# Patient Record
Sex: Male | Born: 1945 | Race: White | Hispanic: No | Smoking: Former smoker
Health system: Southern US, Community
[De-identification: ages and names within clinical notes are randomized; demographics above are authoritative.]

## PROBLEM LIST (undated history)

## (undated) DIAGNOSIS — J302 Other seasonal allergic rhinitis: Secondary | ICD-10-CM

## (undated) DIAGNOSIS — I10 Essential (primary) hypertension: Secondary | ICD-10-CM

## (undated) DIAGNOSIS — K635 Polyp of colon: Secondary | ICD-10-CM

## (undated) DIAGNOSIS — M069 Rheumatoid arthritis, unspecified: Secondary | ICD-10-CM

## (undated) DIAGNOSIS — K219 Gastro-esophageal reflux disease without esophagitis: Secondary | ICD-10-CM

## (undated) DIAGNOSIS — N3281 Overactive bladder: Secondary | ICD-10-CM

## (undated) DIAGNOSIS — K648 Other hemorrhoids: Secondary | ICD-10-CM

## (undated) DIAGNOSIS — C61 Malignant neoplasm of prostate: Secondary | ICD-10-CM

## (undated) DIAGNOSIS — G5603 Carpal tunnel syndrome, bilateral upper limbs: Secondary | ICD-10-CM

## (undated) DIAGNOSIS — G4733 Obstructive sleep apnea (adult) (pediatric): Secondary | ICD-10-CM

## (undated) DIAGNOSIS — H269 Unspecified cataract: Secondary | ICD-10-CM

## (undated) DIAGNOSIS — R351 Nocturia: Secondary | ICD-10-CM

## (undated) DIAGNOSIS — K449 Diaphragmatic hernia without obstruction or gangrene: Secondary | ICD-10-CM

## (undated) DIAGNOSIS — T7840XA Allergy, unspecified, initial encounter: Secondary | ICD-10-CM

## (undated) DIAGNOSIS — K2289 Other specified disease of esophagus: Secondary | ICD-10-CM

## (undated) DIAGNOSIS — G4730 Sleep apnea, unspecified: Secondary | ICD-10-CM

## (undated) DIAGNOSIS — N400 Enlarged prostate without lower urinary tract symptoms: Secondary | ICD-10-CM

## (undated) HISTORY — DX: Gastro-esophageal reflux disease without esophagitis: K21.9

## (undated) HISTORY — DX: Unspecified cataract: H26.9

## (undated) HISTORY — DX: Other specified disease of esophagus: K22.89

## (undated) HISTORY — DX: Overactive bladder: N32.81

## (undated) HISTORY — DX: Malignant neoplasm of prostate: C61

## (undated) HISTORY — DX: Other seasonal allergic rhinitis: J30.2

## (undated) HISTORY — DX: Rheumatoid arthritis, unspecified: M06.9

## (undated) HISTORY — DX: Other hemorrhoids: K64.8

## (undated) HISTORY — DX: Sleep apnea, unspecified: G47.30

## (undated) HISTORY — DX: Benign prostatic hyperplasia without lower urinary tract symptoms: N40.0

## (undated) HISTORY — DX: Polyp of colon: K63.5

## (undated) HISTORY — DX: Obstructive sleep apnea (adult) (pediatric): G47.33

## (undated) HISTORY — DX: Essential (primary) hypertension: I10

## (undated) HISTORY — DX: Diaphragmatic hernia without obstruction or gangrene: K44.9

## (undated) HISTORY — DX: Allergy, unspecified, initial encounter: T78.40XA

---
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
## 2005-12-22 ENCOUNTER — Inpatient Hospital Stay (HOSPITAL_COMMUNITY): Admission: EM | Admit: 2005-12-22 | Discharge: 2005-12-23 | Payer: Self-pay | Admitting: Emergency Medicine

## 2005-12-22 ENCOUNTER — Ambulatory Visit: Payer: Self-pay | Admitting: Cardiology

## 2006-01-28 ENCOUNTER — Ambulatory Visit: Payer: Self-pay | Admitting: Cardiology

## 2008-05-12 ENCOUNTER — Other Ambulatory Visit: Payer: Self-pay

## 2008-05-13 ENCOUNTER — Observation Stay: Payer: Self-pay | Admitting: Internal Medicine

## 2009-10-11 DIAGNOSIS — K635 Polyp of colon: Secondary | ICD-10-CM

## 2009-10-11 HISTORY — DX: Polyp of colon: K63.5

## 2009-10-11 HISTORY — PX: COLONOSCOPY: SHX174

## 2010-03-20 LAB — VITAMIN B12: Vitamin B12 Deficiency Panel: 404

## 2010-03-20 LAB — FOLATE: Folate: 9.4

## 2010-04-20 ENCOUNTER — Ambulatory Visit: Payer: Self-pay | Admitting: Unknown Physician Specialty

## 2010-11-15 ENCOUNTER — Observation Stay (HOSPITAL_COMMUNITY)
Admission: EM | Admit: 2010-11-15 | Discharge: 2010-11-16 | Disposition: A | Discharge status: Home / Self Care | Payer: 59 | Attending: Infectious Diseases | Admitting: Infectious Diseases

## 2010-11-15 ENCOUNTER — Emergency Department (HOSPITAL_COMMUNITY): Payer: 59

## 2010-11-15 DIAGNOSIS — R0609 Other forms of dyspnea: Secondary | ICD-10-CM | POA: Insufficient documentation

## 2010-11-15 DIAGNOSIS — I129 Hypertensive chronic kidney disease with stage 1 through stage 4 chronic kidney disease, or unspecified chronic kidney disease: Secondary | ICD-10-CM | POA: Insufficient documentation

## 2010-11-15 DIAGNOSIS — K219 Gastro-esophageal reflux disease without esophagitis: Secondary | ICD-10-CM | POA: Insufficient documentation

## 2010-11-15 DIAGNOSIS — R0989 Other specified symptoms and signs involving the circulatory and respiratory systems: Secondary | ICD-10-CM | POA: Insufficient documentation

## 2010-11-15 DIAGNOSIS — R079 Chest pain, unspecified: Principal | ICD-10-CM | POA: Insufficient documentation

## 2010-11-15 DIAGNOSIS — I2510 Atherosclerotic heart disease of native coronary artery without angina pectoris: Secondary | ICD-10-CM | POA: Insufficient documentation

## 2010-11-15 DIAGNOSIS — R030 Elevated blood-pressure reading, without diagnosis of hypertension: Secondary | ICD-10-CM

## 2010-11-15 DIAGNOSIS — N189 Chronic kidney disease, unspecified: Secondary | ICD-10-CM | POA: Insufficient documentation

## 2010-11-15 DIAGNOSIS — E785 Hyperlipidemia, unspecified: Secondary | ICD-10-CM | POA: Insufficient documentation

## 2010-11-15 LAB — TROPONIN I: Troponin I: <0.01 ng/mL (ref 0.00–0.06)

## 2010-11-15 LAB — RAPID URINE DRUG SCREEN, HOSP PERFORMED
Amphetamines: NOT DETECTED
Barbiturates: NOT DETECTED
Cocaine: NOT DETECTED
Tetrahydrocannabinol: NOT DETECTED

## 2010-11-15 LAB — CK TOTAL AND CKMB (NOT AT ARMC)
CK, MB: 1.9 ng/mL (ref 0.3–4.0)
Relative Index: INVALID (ref 0.0–2.5)
Total CK: 82 U/L (ref 7–232)

## 2010-11-15 LAB — POCT CARDIAC MARKERS
CKMB, poc: <1 ng/mL — ABNORMAL LOW (ref 1.0–8.0)
Myoglobin, poc: 74.9 ng/mL (ref 12–200)
Troponin i, poc: <0.05 ng/mL (ref 0.00–0.09)

## 2010-11-15 LAB — CBC
HCT: 47.1 % (ref 39.0–52.0)
MCV: 88.5 fL (ref 78.0–100.0)
Platelets: 245 10*3/uL (ref 150–400)
RBC: 5.32 MIL/uL (ref 4.22–5.81)
RDW: 13.2 % (ref 11.5–15.5)
WBC: 11 10*3/uL — ABNORMAL HIGH (ref 4.0–10.5)

## 2010-11-15 LAB — POCT I-STAT, CHEM 8
BUN: 19 mg/dL (ref 6–23)
Creatinine, Ser: 1.6 mg/dL — ABNORMAL HIGH (ref 0.4–1.5)
HCT: 48 % (ref 39.0–52.0)
Hemoglobin: 16.3 g/dL (ref 13.0–17.0)
Potassium: 4.5 mEq/L (ref 3.5–5.1)
TCO2: 26 mmol/L (ref 0–100)

## 2010-11-15 LAB — DIFFERENTIAL: Basophils Relative: 0 % (ref 0–1)

## 2010-11-15 LAB — URINALYSIS, ROUTINE W REFLEX MICROSCOPIC
Ketones, ur: NEGATIVE mg/dL
Specific Gravity, Urine: 1.013 (ref 1.005–1.030)
pH: 5.5 (ref 5.0–8.0)

## 2010-11-16 ENCOUNTER — Telehealth (INDEPENDENT_AMBULATORY_CARE_PROVIDER_SITE_OTHER): Payer: Self-pay | Admitting: *Deleted

## 2010-11-16 DIAGNOSIS — R0789 Other chest pain: Secondary | ICD-10-CM

## 2010-11-16 LAB — BASIC METABOLIC PANEL
CO2: 25 mEq/L (ref 19–32)
Calcium: 8.9 mg/dL (ref 8.4–10.5)
Creatinine, Ser: 1.43 mg/dL (ref 0.4–1.5)
GFR calc Af Amer: >60 mL/min (ref >60)
GFR calc non Af Amer: 50 mL/min — ABNORMAL LOW (ref >60)
Glucose, Bld: 111 mg/dL — ABNORMAL HIGH (ref 70–99)

## 2010-11-16 LAB — LIPID PANEL: LDL Cholesterol: 130 mg/dL — ABNORMAL HIGH (ref 0–99)

## 2010-11-16 LAB — HEMOGLOBIN A1C: Hgb A1c MFr Bld: 5.7 % — ABNORMAL HIGH (ref <5.7)

## 2010-11-16 LAB — CARDIAC PANEL(CRET KIN+CKTOT+MB+TROPI)
CK, MB: 1.3 ng/mL (ref 0.3–4.0)
Relative Index: INVALID (ref 0.0–2.5)
Relative Index: INVALID (ref 0.0–2.5)
Troponin I: <0.01 ng/mL (ref 0.00–0.06)
Troponin I: 0.01 ng/mL (ref 0.00–0.06)

## 2010-11-16 LAB — SODIUM, URINE, RANDOM: Sodium, Ur: 83 mEq/L

## 2010-11-16 LAB — D-DIMER, QUANTITATIVE: D-Dimer, Quant: 0.44 ug/mL-FEU (ref 0.00–0.48)

## 2010-11-17 ENCOUNTER — Ambulatory Visit (HOSPITAL_COMMUNITY): Payer: 59 | Attending: Cardiology

## 2010-11-17 ENCOUNTER — Encounter: Payer: Self-pay | Admitting: Cardiology

## 2010-11-17 ENCOUNTER — Encounter: Payer: 59 | Admitting: Physician Assistant

## 2010-11-17 DIAGNOSIS — R0789 Other chest pain: Secondary | ICD-10-CM

## 2010-11-17 DIAGNOSIS — R0602 Shortness of breath: Secondary | ICD-10-CM

## 2010-11-17 DIAGNOSIS — R079 Chest pain, unspecified: Secondary | ICD-10-CM

## 2010-11-19 ENCOUNTER — Ambulatory Visit (HOSPITAL_COMMUNITY): Payer: 59 | Attending: Cardiology

## 2010-11-19 DIAGNOSIS — I2510 Atherosclerotic heart disease of native coronary artery without angina pectoris: Secondary | ICD-10-CM | POA: Insufficient documentation

## 2010-11-19 DIAGNOSIS — I379 Nonrheumatic pulmonary valve disorder, unspecified: Secondary | ICD-10-CM | POA: Insufficient documentation

## 2010-11-19 DIAGNOSIS — I079 Rheumatic tricuspid valve disease, unspecified: Secondary | ICD-10-CM | POA: Insufficient documentation

## 2010-11-19 DIAGNOSIS — E785 Hyperlipidemia, unspecified: Secondary | ICD-10-CM | POA: Insufficient documentation

## 2010-11-19 DIAGNOSIS — R072 Precordial pain: Secondary | ICD-10-CM | POA: Insufficient documentation

## 2010-11-19 DIAGNOSIS — I10 Essential (primary) hypertension: Secondary | ICD-10-CM | POA: Insufficient documentation

## 2010-11-20 ENCOUNTER — Encounter (INDEPENDENT_AMBULATORY_CARE_PROVIDER_SITE_OTHER): Payer: Self-pay | Admitting: *Deleted

## 2010-11-20 ENCOUNTER — Other Ambulatory Visit (HOSPITAL_COMMUNITY): Payer: 59

## 2010-11-26 HISTORY — PX: CARDIOVASCULAR STRESS TEST: SHX262

## 2010-11-26 NOTE — Progress Notes (Signed)
Summary: Nuclear pre procedure  Phone Note Outgoing Call Call back at [facility] Phone [phone]   Call placed by: Rea [employer], CMA,  November 16, 2010 4:20 PM Call placed to: Patient Summary of Call: Reviewed information on Myoview Information Sheet (see scanned document for further details).  Patient still in hospital at [facility]. Spoke with patient's nurse regarding instructions for stress test.      Nuclear Med Background Indications for Stress Test: Evaluation for Ischemia, Post Hospital  Indications Comments: 11/15/10 Chest pain  History: Heart Catheterization  History Comments: '07 Cath:minimal n/o CAD, EF=60%  Symptoms: Chest Pain, Chest Tightness    Nuclear Pre-Procedure Cardiac Risk Factors: Hypertension

## 2010-11-26 NOTE — Letter (Signed)
Summary: Return To Work  [employer], Main Office  [address]   [place], [place] [address]   Phone: [phone]  Fax: [phone]    11/20/2010  TO: Leodis Sias IT MAY CONCERN   RE: Kevin Campbell [address] [id number]   The above named individual is under my medical care and may return to work [employer] 11-23-10 WITH NO RESTRICTIONS  If you have any further questions or need additional information, please call.     Sincerely, Deliah Goody, RN/Dr Olga Millers  Appended Document: Return To Work faxed to [phone] attn chris at pts request

## 2010-11-26 NOTE — Assessment & Plan Note (Signed)
Summary: Cardiology Nuclear Testing  Nuclear Med Background Indications for Stress Test: Evaluation for Ischemia  Indications Comments: 11/15/10 Chest pain  History: Heart Catheterization  History Comments: '07 Cath:minimal n/o CAD, EF=60%  Symptoms: Chest Pain, Chest Tightness, DOE, SOB    Nuclear Pre-Procedure Cardiac Risk Factors: Hypertension Caffeine/Decaff Intake: none NPO After: 9:00 PM Lungs: clear IV 0.9% NS with Angio Cath: 22g     IV Site: R Hand IV Started by: Cathlyn Parsons, RN Chest Size (in) 44     Height (in): 67 Weight (lb): 242 BMI: 38.04 Tech Comments: The patient was unable to keep up with the treadmill, so he was turned to a walking Lexiscan.  Nuclear Med Study 1 or 2 day study:  1 day     Stress Test Type:  Treadmill/Lexiscan Reading MD:  Olga Millers, MD     Referring MD:  B.Len Kluver Resting Radionuclide:  Technetium 45m Tetrofosmin     Resting Radionuclide Dose:  11 mCi  Stress Radionuclide:  Technetium 16m Tetrofosmin     Stress Radionuclide Dose:  33 mCi   Stress Protocol  Max Systolic BP: 127 mm Hg Lexiscan: 0.4 mg   Stress Test Technologist:  Milana Na, EMT-P     Nuclear Technologist:  Domenic Polite, CNMT  Rest Procedure  Myocardial perfusion imaging was performed at rest 45 minutes following the intravenous administration of Technetium 17m Tetrofosmin.  Stress Procedure  The patient received IV Lexiscan 0.4 mg over 15-seconds with concurrent low level exercise and then Technetium 67m Tetrofosmin was injected at 30-seconds while the patient continued walking one more minute.  There were no significant changes with Lexiscan.  Quantitative spect images were obtained after a 45 minute delay.  QPS Raw Data Images:  There is no interference from other nuclear activity. Stress Images:  There is decreased uptake in the inferior wall Rest Images:  There is decreased uptake in the inferior wall. Subtraction (SDS):  No evidence of  ischemia. Transient Ischemic Dilatation:  .98  (Normal <1.22)  Lung/Heart Ratio:  .37  (Normal <0.45)  Quantitative Gated Spect Images QGS EDV:  103 ml QGS ESV:  52 ml QGS EF:  49 % QGS cine images:  Normal wall motion; visually, LV function appears better than calculated EF; suggest echo to better evaluate.   Overall Impression  Exercise Capacity: Lexiscan with no exercise. BP Response: Normal blood pressure response. Clinical Symptoms: No chest pain ECG Impression: No significant ST segment change suggestive of ischemia. Overall Impression: Normal lexiscan nuclear study with inferior thinning but no ischemia.  Appended Document: Cardiology Nuclear Testing schedule echo to better assess EF; otherwise normal study  Appended Document: Cardiology Nuclear Testing pt walked into the office today to get nuc results. he is very anxious about returning to work. echo will be done today.

## 2010-12-07 ENCOUNTER — Encounter: Payer: 59 | Admitting: Cardiology

## 2010-12-09 DIAGNOSIS — N401 Enlarged prostate with lower urinary tract symptoms: Secondary | ICD-10-CM | POA: Insufficient documentation

## 2010-12-09 DIAGNOSIS — E1169 Type 2 diabetes mellitus with other specified complication: Secondary | ICD-10-CM | POA: Insufficient documentation

## 2010-12-09 DIAGNOSIS — K219 Gastro-esophageal reflux disease without esophagitis: Secondary | ICD-10-CM | POA: Insufficient documentation

## 2010-12-09 DIAGNOSIS — R351 Nocturia: Secondary | ICD-10-CM

## 2010-12-09 DIAGNOSIS — N183 Chronic kidney disease, stage 3 (moderate): Secondary | ICD-10-CM

## 2010-12-09 DIAGNOSIS — I10 Essential (primary) hypertension: Secondary | ICD-10-CM | POA: Insufficient documentation

## 2010-12-09 DIAGNOSIS — R079 Chest pain, unspecified: Secondary | ICD-10-CM | POA: Insufficient documentation

## 2010-12-09 DIAGNOSIS — E785 Hyperlipidemia, unspecified: Secondary | ICD-10-CM | POA: Insufficient documentation

## 2010-12-10 ENCOUNTER — Other Ambulatory Visit: Payer: Self-pay | Admitting: Cardiology

## 2010-12-10 ENCOUNTER — Encounter (INDEPENDENT_AMBULATORY_CARE_PROVIDER_SITE_OTHER): Payer: 59 | Admitting: Cardiology

## 2010-12-10 ENCOUNTER — Encounter: Payer: Self-pay | Admitting: Cardiology

## 2010-12-10 DIAGNOSIS — E7800 Pure hypercholesterolemia, unspecified: Secondary | ICD-10-CM

## 2010-12-10 DIAGNOSIS — R072 Precordial pain: Secondary | ICD-10-CM

## 2010-12-11 LAB — HEPATIC FUNCTION PANEL
ALT: 22 U/L (ref 0–53)
Bilirubin, Direct: 0.1 mg/dL (ref 0.0–0.3)
Total Protein: 7 g/dL (ref 6.0–8.3)

## 2010-12-11 LAB — LIPID PANEL
Cholesterol: 123 mg/dL (ref 0–200)
HDL: 34 mg/dL — ABNORMAL LOW (ref >39.00)
LDL Cholesterol: 73 mg/dL (ref 0–99)
Total CHOL/HDL Ratio: 4
Triglycerides: 80 mg/dL (ref 0.0–149.0)
VLDL: 16 mg/dL (ref 0.0–40.0)

## 2010-12-16 ENCOUNTER — Encounter (INDEPENDENT_AMBULATORY_CARE_PROVIDER_SITE_OTHER): Payer: Self-pay | Admitting: *Deleted

## 2010-12-17 NOTE — Assessment & Plan Note (Signed)
Summary: eph. f/u on stress test per mat pagd   History of Present Illness: 65 year old male recently admitted with chest pain. Previous catheterization in 2007 revealed no obstructive coronary disease and normal LV function. Admitted to [facility] in February of 2012 with chest pain. Enzymes negative. D-dimer negative. Outpatient nuclear study showed inferior thinning but no ischemia. Ejection fraction 49%. Followup Echo showed an ejection fraction of 50-55% and mild left atrial enlargement. Since discharge, he has occasional chest pain. It  can occasionally be under his left breast or right breast. It is described as a sharp pain lasting seconds. He can also have a tightness in the substernal area that occurs with stress that lasts seconds. These pains do not radiate. They are not pleuritic, positional, related to food or exertional. There are no associated symptoms. He does not have dyspnea on exertion, orthopnea, PND, syncope or exertional chest pain.  Current Medications (verified): 1)  Simvastatin 20 Mg Tabs (Simvastatin) .[name].. 1 By Mouth Daily 2)  Vitamin D3 2000 Unit Caps (Cholecalciferol) .[name].. 1 By Mouth Daily 3)  Aspirin 81 Mg  Tabs (Aspirin) .[name].. 1 By Mouth Daily 4)  Protonix 40 Mg Tbec (Pantoprazole Sodium) .[name].. 1 By Mouth Daily  Allergies (verified): No Known Drug Allergies  Past History:  Past Medical History: DYSLIPIDEMIA  HYPERTENSION GERD OBESITY BENIGN PROSTATIC HYPERTROPHY, WITH OBSTRUCTION  KIDNEY DISEASE  Past Surgical History: Reviewed history from 12/09/2010 and no changes required.  Left knee arthroscopy in 1994.  Social History: Reviewed history from 12/09/2010 and no changes required.  He lives in [place] with his wife.  He is a Ecologist.  He does have daughters.  He previously smoked 2-3 cigars a day for  20 years, quitting 15 years ago.  He denies any alcohol or drug use.  He  does not routinely exercise and does not follow a strict diet  and in fact  says he has a truckers' diet.  Review of Systems       no fevers or chills, productive cough, hemoptysis, dysphasia, odynophagia, melena, hematochezia, dysuria, hematuria, rash, seizure activity, orthopnea, PND, pedal edema, claudication. Remaining systems are negative.   Vital Signs:  Patient profile:   65 year old male Height:      67 inches Weight:      251 pounds BMI:     39.45 Pulse rate:   84 / minute Resp:     16 per minute BP sitting:   156 / 96  (left arm)  Vitals Entered By: Marrion Coy, CNA (December 10, 2010 4:21 PM)  Physical Exam  General:  Well-developed well-nourished in no acute distress.  Skin is warm and dry.  HEENT is normal.  Neck is supple. No thyromegaly.  Chest is clear to auscultation with normal expansion.  Cardiovascular exam is regular rate and rhythm.  Abdominal exam nontender or distended. No masses palpated. Extremities show no edema. neuro grossly intact    Impression & Recommendations:  Problem # 1:  CHEST PAIN (ICD-786.50) Symptoms are extremely atypical and chronic. Previous catheterization revealed nonobstructive coronary disease. Recent Myoview negative. No further ischemia evaluation. His updated medication list for this problem includes:    Aspirin 81 Mg Tabs (Aspirin) .[name]... 1 by mouth daily  Problem # 2:  DYSLIPIDEMIA (ICD-272.4) Continue statin. Check lipids and liver. He will then followup with his primary care for further management of this issue. His updated medication list for this problem includes:    Simvastatin 20 Mg Tabs (  Simvastatin) .[name]... 1 by mouth daily  Problem # 3:  HYPERTENSION (ICD-401.9) Patient's blood pressure is elevated today. However he states typically normal. He will follow this and then see his primary care physician. Antihypertensive can be added as needed. His updated medication list for this problem includes:    Aspirin 81 Mg Tabs (Aspirin) .[name]... 1 by mouth daily  Problem # 4:  GERD  (ICD-530.81)  His updated medication list for this problem includes:    Protonix 40 Mg Tbec (Pantoprazole sodium) .[name]... 1 by mouth daily  Problem # 5:  KIDNEY DISEASE (ICD-593.9)  Other Orders: TLB-Lipid Panel (80061-LIPID) TLB-Hepatic/Liver Function Pnl (80076-HEPATIC)  Patient Instructions: 1)  FOLLOW UP WILL BE WITH DR Lacie Scotts

## 2010-12-18 NOTE — Consult Note (Signed)
Kevin Campbell, Kevin Campbell              ACCOUNT NO.:  [id number]  MEDICAL RECORD NO.:  [id number]           PATIENT TYPE:  I  LOCATION:  2022                         FACILITY:  [facility]  PHYSICIAN:  Pricilla Riffle, MD, FACCDATE OF BIRTH:  1946/04/15  DATE OF CONSULTATION:  11/16/2010 DATE OF DISCHARGE:  11/16/2010                                CONSULTATION   PRIMARY CARDIOLOGIST:  Madolyn Frieze. Jens Som, MD, [facility]  PRIMARY CARE PHYSICIAN:  Dr. Evelene Croon.  REASON FOR CONSULTATION:  Chest discomfort.  HISTORY OF PRESENT ILLNESS:  [name] is a 65 year old Caucasian gentleman with a known history of minimal nonobstructive coronary artery disease per cardiac catheterization in March 2007, that showed no lesion over 30%, normal left ventricular systolic function with EF of 60%, and normal renal arteries at that time, question of history of hypertension, obesity, and GERD, who now presents for further evaluation of chest discomfort first noted yesterday afternoon at rest with subsequent finding of significantly elevated blood pressure with systolic blood pressure of 198 at that time.  The patient was in his usual state of health until the afternoon of November 15, 2010, when he noticed chest tightness that first began at rest.  He was talking with his family and not in any significant stress when he noticed chest tightness across his chest that was neither pleuritic nor positional nor exertional.  The patient gave it some time but unfortunately symptoms were not resolving, so he decided to present to the fire department near his home for further evaluation.  His wife drove him there and his blood pressure was checked, found to be 198/86. EKG was completed with no concerning findings.  However, due to his elevated blood pressure and ongoing symptoms although much improved by the time of his EKG and blood pressure checked at fire department, he decided to present to [facility] ED for further  evaluation.  The patient notes that his symptoms had resolved by the time he got to [facility] ED, having lasted approximately 45 minutes.  He had no associated symptoms including no diaphoresis, no shortness of breath, no nausea, no vomiting, no palpitations, no pre/syncope, nor any other recent changes like fevers, chills, bleeding, cough, etc.  The patient has had no hypertension here at [facility] ED and has not received any anti-hyperintense therapy.  At home, he took 2 low-dose aspirin and here he continues on low-dose aspirin, Protonix 40 mg p.o. daily, and heparin for DVT prophylaxis as well as p.r.n. medications.  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  He also works in his garden and works on cars without symptoms although he does no regular exercise.  No history of tobacco abuse, quitting over 20 years ago.  No recent complaints other than described above.  Of note, he did go on a long drive a less than week ago for over 5 hours but D-dimer has been checked and is negative.  Careful review of his EKG tracings from yesterday and today revealed no acute changes.  Cardiac enzymes  are negative x1 set of point-of-care markers and 2 full sets of enzymes. BNP is less than 30.  Labs otherwise unremarkable except for LDL 130 and HDL 27.  The patient has had no recurrence of symptoms while here at the hospital.  PAST MEDICAL HISTORY: 1. Minimal coronary artery disease per cardiac cath in March 2007     (nothing over 30% lesion, normal left ventricular systolic     function, normal renal arteries). 2. Question history of hypertension (regular followup with primary     care provider, not on meds, per the patient, no significant     hypertension in the past). 3. Obesity. 4. GERD. 5. Plantar fasciitis  PAST SURGICAL HISTORY:  Left knee arthroscopy, 1994.  SOCIAL HISTORY:  The patient lives in  [place] with his wife.  He is a Naval architect.  He has remote history of tobacco abuse, quitting 20 years ago.  No EtOH.  No illicit drug use.  No herbal meds.  Regular diet.  No regular exercise but is very active in daily life, getting in and out of his truck without difficulty, working in the garden as well as working on cars without any exertional symptoms.  FAMILY HISTORY:  Mother positive for history of CAD but first diagnosed in early 24s, CABG x3.  Father died of lung cancer in 59s.  No other first-degree relatives with coronary artery disease or significant medical history.  REVIEW OF SYSTEMS:  Please see HPI.  All other systems were reviewed and were negative.  CODE STATUS:  Full.  ALLERGIES: 1. BENADRYL. 2. CODEINE.  MEDICATIONS:  Home:  None.  In the hospital: 1. ECASA 81 mg p.o. daily. 2. Protonix 40 mg daily. 3. Heparin 5000 units subcu injection q.8 hours. 4. P.r.n. medications.  PHYSICAL EXAMINATION:  VITAL SIGNS:  Temp is 98.0 degrees Fahrenheit, BP 109-118/79-85, pulse 70s to 80s, respirations 18, O2 saturation 93% on room air. GENERAL:  The patient is alert and oriented x3, in no apparent distress. No respiratory distress with full sentences or minimal movements during exam. HEENT:  Head is normocephalic, atraumatic.  Pupils equal, round, and react to light.  Extraocular muscles are intact.  Nares are patent without discharge.  Dentition is fair.  Oropharynx without erythema or exudates. NECK:  Supple without lymphadenopathy.  No thyromegaly.  No JVD. HEART:  Rate is regular with audible S1 and S2.  No clicks, rubs, murmurs, or gallops.  Pulses are 2+ and equal in both upper and lower extremities bilaterally. LUNGS:  Clear to auscultation bilaterally. SKIN:  No rashes, lesions, or petechiae. ABDOMEN:  Soft, nontender, nondistended.  Normal abdominal bowel sounds. No rebound or guarding.  No hepatosplenomegaly.  The patient is obese. EXTREMITIES:   Without clubbing, cyanosis, or edema. MUSCULOSKELETAL:  Without joint deformity or effusions.  No spinal or CVA tenderness. Chest, nontender. NEUROLOGIC:  Cranial nerves II-XII grossly intact.  Strength 5/5 in all extremities and axial groups.  Normal sensation throughout.  Normal cerebellar function.  RADIOLOGY:  Chest x-ray showed no significant findings.  EKG, rhythm NSR, rate 75 bpm, no significant ST-T wave changes but delayed R-wave progression.  No significant Q-waves.  Normal axis.  No evidence of hypertrophy.  PR 118, QRS 274, and QTc 424.  No prior tracing for comparison.  LABS:  WBC is 11.0 with a normal differential, HGB 16.3, HCT 48.0, PLT count 245.  Sodium 140, potassium 4.2, chloride 106, bicarb 25, BUN 16, creatinine 1.43, glucose 111.  D-dimer 0.44.  BNP  less than 30.  Point- of-care markers negative x1, 2 full sets of cardiac enzymes within normal limits.  ProTime 12.9, INR 0.95.  Total cholesterol 173, triglycerides 82, HDL 27, LDL 130.  ASSESSMENT AND PLAN:  The patient was initially seen by Lenard Galloway, PAC, and then seen and thoroughly assessed by attending cardiologist, Dr. Dietrich Pates.  [name] is a 65 year old Caucasian gentleman with a known history of minimal coronary artery disease with no lesions over 30%, normal left ventricular systolic function, normal renal arteries per cath in March 2007, as well as question of history of hypertension, obesity, as well as history significant for all GERD, who now presents with chest discomfort with both typical and atypical features.  1. Chest pain - mostly atypical and clearly no exertional symptoms     recently as well as no objective evidence of a cardiac etiology.     Of note, his symptoms in 2007 prior to that catheterization were     very similar.  It is concerning that his blood pressure was     significantly elevated at a fire department, but the picture looks     overall to the point to alternative  etiology.  We would schedule     treadmill stress Myoview at our office as soon as possible but does     not need further ischemic evaluation in the hospital.  Would     ambulate to ensure no recurrent symptoms prior to discharge. 2. Dyslipidemia - the patient has known minimal coronary artery     disease with LDL above goal and HDL low, would start low-dose     generic statin therapy as well as continue emphasized the     importance of heart healthy diet/lifestyle. 3. Hypertension.  It is curious that the patient is on no     antihypertensives here in the hospital, his blood pressure is     within normal limits.  Would get a blood pressure cuff for home     checks and followup with primary care provider. 4. Obesity - I emphasized the importance of heart healthy diet and     lifestyle.  The patient is motivated to increase activity and     increased fruits and vegetables as well as decreasing simple     carbohydrates and saturated fat intake.  ADDENDUM:  Note:  Dr. Tenny Craw spoke to the patient and he is very anxious to get back to work.  Unfortunately, the patient will need some ischemic evaluation prior to returning to work and therefore if we cannot get an outpatient Myoview tomorrow in our office, we will arrange for inpatient treadmill Myoview.  Thanks for the consult.     Jarrett Ables, PAC   ______________________________ Pricilla Riffle, MD, [facility]    MS/MEDQ  D:  11/16/2010  T:  11/17/2010  Job:  [id number]  Electronically Signed by Jarrett Ables PAC on 11/18/2010 03:26:06 PM Electronically Signed by Dietrich Pates MD FACC on 12/17/2010 02:12:46 PM

## 2010-12-22 NOTE — Letter (Signed)
Summary: Custom - Lipid  [facility], Main Office  [address]   [place], [place] [address]   Phone: [phone]  Fax: [phone]     December 16, 2010 MRN: [id number]   Kevin Campbell [address] [place], [place]  [address]   Dear Kevin Campbell,  We have reviewed your cholesterol results.  They are as follows:     Total Cholesterol:    123 (Desirable: less than 200)       HDL  Cholesterol:     34.00  (Desirable: greater than 40 for men and 50 for women)       LDL Cholesterol:       73  (Desirable: less than 100 for low risk and less than 70 for moderate to high risk)       Triglycerides:       80.0  (Desirable: less than 150)  Our recommendations include:These numbers look good. Continue on the same medicine. Liver function is normal. Follow up with your primary care physican for future lab work.Take care, Dr. Darel Hong.    Call our office at the number listed above if you have any questions.  Lowering your LDL cholesterol is important, but it is only one of a large number of "risk factors" that may indicate that you are at risk for heart disease, stroke or other complications of hardening of the arteries.  Other risk factors include:   A.  Cigarette Smoking* B.  High Blood Pressure* C.  Obesity* D.   Low HDL Cholesterol (see yours above)* E.   Diabetes Mellitus (higher risk if your is uncontrolled) F.  Family history of premature heart disease G.  Previous history of stroke or cardiovascular disease    *These are risk factors YOU HAVE CONTROL OVER.  For more information, visit .  There is now evidence that lowering the TOTAL CHOLESTEROL AND LDL CHOLESTEROL can reduce the risk of heart disease.  The American Heart Association recommends the following guidelines for the treatment of elevated cholesterol:  1.  If there is now current heart disease and less than two risk factors, TOTAL CHOLESTEROL should be less than 200 and LDL CHOLESTEROL should be  less than 100. 2.  If there is current heart disease or two or more risk factors, TOTAL CHOLESTEROL should be less than 200 and LDL CHOLESTEROL should be less than 70.  A diet low in cholesterol, saturated fat, and calories is the cornerstone of treatment for elevated cholesterol.  Cessation of smoking and exercise are also important in the management of elevated cholesterol and preventing vascular disease.  Studies have shown that 30 to 60 minutes of physical activity most days can help lower blood pressure, lower cholesterol, and keep your weight at a healthy level.  Drug therapy is used when cholesterol levels do not respond to therapeutic lifestyle changes (smoking cessation, diet, and exercise) and remains unacceptably high.  If medication is started, it is important to have you levels checked periodically to evaluate the need for further treatment options.  Thank you,    [employer] Team

## 2011-01-12 NOTE — Discharge Summary (Signed)
NAME:  Kevin Campbell, Kevin Campbell              ACCOUNT NO.:  [id number]  MEDICAL RECORD NO.:  [id number]           PATIENT TYPE:  I  LOCATION:  2022                         FACILITY:  [facility]  PHYSICIAN:  Lacretia Leigh. Hatcher, M.D.DATE OF BIRTH:  1946-02-02  DATE OF ADMISSION:  11/15/2010 DATE OF DISCHARGE:  11/16/2010                              DISCHARGE SUMMARY   DISCHARGE DIAGNOSES: 1. Chest pain, most likely atypical. 2. Hypertension. 3. Dyslipidemia. 4. Chronic kidney disease.  DISCHARGE MEDICATIONS: 1. Aspirin 81 mg p.o. daily. 2. Protonix 40 mg p.o. daily. 3. Zocor 20 mg p.o. daily. 4. Vitamin D3 2000 units 1 tablet p.o. daily.  DISPOSITION AND FOLLOWUP:  Kevin Campbell was discharged from [facility] on November 16, 2010 in stable and improved condition.  He has no other episode of chest pain, nausea, vomiting or diaphoresis or shortness of breath.  He needs to continue to take his aspirin 81 mg p.o. daily.  If he later develops chest pain, nausea and vomiting, diaphoresis or short of breath, he needs to make an appointment with his PCP or go to the nearest ED for further evaluation and treatment.  He has an appointment with Cardiology for a stress test on November 17, 2010.  He also needs to follow up with Dr. Lacie Scotts, his PCP in 3-7 days for a followup.  At that time, he will need to: 1. Check on his chest pain. 2. Follow up on his hypertension because prior to admission, the     patient's blood pressure was elevated to systolics greater than     190s. 3. Follow up on his creatinine level.  On admission, it was 1.6,     however, it has trended down to his baseline which was 1.3-1.4.  CONSULTATIONS:  Cardiology with Dr. Dietrich Pates, please see transcription for full consult note.  PROCEDURE PERFORMED:  Chest x-ray on November 15, 2010 shows no significant abnormality.  No pleural effusion identified.  EKG on November 15, 2010 shows normal sinus rhythm with no T-wave or ST  changes or Q-waves.  EKG on November 16, 2010 shows again normal sinus rhythm.  ADMISSION HISTORY:  Kevin Campbell is a 65 year old man with past medical history of CAD who presented to the [facility] ED with complaints of chest pain that started in the afternoon on the day of admission.  The pain started while he was sitting in his recliner and there was a tightness across both sides of his chest with no radiation.  It was associated with some wheeziness but no short of breath, diaphoresis, or palpitation.  The pain did not resolve with 2 aspirins, so his wife drove him to the local fire station where his blood pressure was found to be 198/96.  He was advised to come to the ED.  Chest pain has resolved by the time he presented to the ED.  He had a small cold that was producing occasional phlegm, cough, and minor URI congestion.  He also noted while he was having a chest x-ray that he got sharp pain when he took a deep breath.  He denied any fever,  chills, nausea, vomiting, diarrhea, constipation, and urinary complaints.  He takes no medication. He has occasional heartburn when he eats spicy food and belching with some food.  ADMITTING PHYSICAL EXAMINATION:  VITAL SIGNS:  Temperature 98.7, pulse 74, blood pressure 142/89, respiratory rate 18, and saturating 98% on room air. GENERAL:  Pleasant, obese-appearing man lying on stretcher in no acute distress. HEENT:  Pupils equal, round, and reactive to light and accommodation. Extraocular movements intact. CARDIOVASCULAR:  Regular rate and rhythm.  No rubs or gallops appreciated.  S1-S2. LUNGS:  Clear to auscultation.  No wheezes, crackles, or rhonchi. Abdomen:  Soft, nondistended, and nontender.  Positive normoactive bowel sounds. EXTREMITIES:  No cyanosis or clubbing.  Distal pulses +2 and symmetric. SKIN:  Warm and moist. NEURO:  Alert and oriented x3.  No neural focal deficits.  Motor strength 5/5 in all 4 extremities.  Fine touch to  sensation intact. PSYCH:  Mood and affect appropriate.  ADMISSION LABORATORY DATA:  WBC 11.0, hemoglobin 15.7, hematocrit 47.1, platelet 245, neutrophil 53%, ANC 5.8.  Sodium 140, potassium 4.5, chloride 105, bicarb 26, BUN 19, creatinine 1.6, and glucose 106.  PT 12.9 and INR 0.95.  BNP less than 30.  POC CK-MB 1.1, POC troponin less than 0.05, POC myoglobin 108.  CK-MB POC less than 1.0, POC troponin less than 0.05, and POC myoglobin 74.9.  HOSPITAL COURSE: 1. Chest pain.  Given his history of mild nonobstructive CAD, the     patient was admitted for an ACS evaluation.  Cardiology saw the     patient in the ED and stated that they did not feel he met criteria     for urgent cath.  Other possible causes besides he     has include GERD, hypertensive urgency that resolved,     musculoskeletal, anxiety, pleuritic chest pain, possible PE.  His     revised Geneva score is zero, so PE is very unlikely.  EKG was     normal with no T-wave, ST changes, or Q-waves.  His chest x-ray was     normal, no pneumothorax, pneumonia, or mediastinal widening on     chest x-ray to suggest aortic dissection.  The patient had cardiac     enzymes x3 which were negative.  His EKG continued to be in normal     sinus rhythm.  D-dimer was also negative.  The patient was placed     on aspirin 81 mg p.o. daily as well as Protonix 40 mg p.o. daily.     He received morphine for pain control.  On the morning of November 16, 2010, the patient complained of one episode of chest pain     without any radiation or diaphoresis which was very short in     duration.  Cardiology did not feel that he needed an     inpatient stress test.  The patient is scheduled for an outpatient     stress test on November 17, 2010 and to follow up with Dr. Jens Som. 2. Chronic kidney disease with a baseline creatinine of 1.3-1.4.  On     admission, his creatinine was 1.6.  Urinalysis was within normal     limits.  The patient continued to  increase fluid intake orally and     his creatinine has trended down to 1.43 which is most likely his     baseline.  We also checked his hemoglobin A1c for diabetes, and his     A1c  was 5.7.  Urine sodium was 83 and urine creatinine was 175.7. 3. Hypertension.  The patient was adequately controlled.  Blood     pressure ranged between 109-132 systolic and 75-85 diastolic.     Therefore, we continued to monitor his blood pressure. 4. Hyperlipidemia.  Lipid profile was performed with a cholesterol of     0.73, triglycerides 82, LDL 130, and HDL 27.  We will continue his     Zocor 20 mg p.o. daily. 5. DVT prophylaxis.  The patient received Lovenox 40 mg subcu daily     which was changed to heparin subcu 5000 units t.i.d. given his CKD.  DISCHARGE VITAL SIGNS:  Temperature 98.5, blood pressure 132/75, pulse 73, respirations 18, and saturating 95% on room air.  DISCHARGE LABORATORY DATA:  Cardiac enzymes set #3 with a CK total of 69, CK-MB 1.3, and troponin was 0.01.  Sodium 140, potassium 4.2, chloride 106, bicarb 25, BUN 16, creatinine 1.43, and glucose 111.  TSH 0.818.  Hemoglobin 5.7.  Sodium urine 83 and urine creatinine 175.7.    ______________________________ Carrolyn Meiers, MD   ______________________________ Lacretia Leigh. Ninetta Lights, M.D.    MH/MEDQ  D:  11/17/2010  T:  11/18/2010  Job:  [id number]  cc:   Madolyn Frieze. Jens Som, MD, [facility] Meindert Jiles Prows, MD, [facility]  Electronically Signed by Carrolyn Meiers MD on 12/17/2010 02:43:36 PM Electronically Signed by Johny Sax M.D. on 01/12/2011 08:26:34 AM

## 2011-10-12 HISTORY — PX: KNEE ARTHROSCOPY: SUR90

## 2011-11-08 LAB — COMPREHENSIVE METABOLIC PANEL
AST: 16 U/L
Creat: 1.39
Glucose: 118
Potassium: 4.1 mmol/L
Sodium: 140 mmol/L (ref 137–147)

## 2011-11-08 LAB — LIPID PANEL: Cholesterol, Total: 156

## 2011-11-08 LAB — CBC
Hemoglobin: 14.6 g/dL (ref 13.5–17.5)
platelet count: 243

## 2011-11-08 LAB — PSA: PSA: 5.7 ng/mL

## 2011-11-30 ENCOUNTER — Ambulatory Visit: Payer: 59 | Admitting: Family Medicine

## 2012-01-10 HISTORY — PX: SHOULDER ARTHROSCOPY WITH BICEPS TENDON REPAIR: SHX5674

## 2012-01-28 ENCOUNTER — Ambulatory Visit (INDEPENDENT_AMBULATORY_CARE_PROVIDER_SITE_OTHER): Payer: BC Managed Care – PPO | Admitting: Family Medicine

## 2012-01-28 ENCOUNTER — Encounter: Payer: Self-pay | Admitting: Family Medicine

## 2012-01-28 VITALS — BP 136/96 | HR 72 | Temp 97.7°F | Ht 67.0 in | Wt 260.8 lb

## 2012-01-28 DIAGNOSIS — R0600 Dyspnea, unspecified: Secondary | ICD-10-CM

## 2012-01-28 DIAGNOSIS — I10 Essential (primary) hypertension: Secondary | ICD-10-CM

## 2012-01-28 DIAGNOSIS — K219 Gastro-esophageal reflux disease without esophagitis: Secondary | ICD-10-CM

## 2012-01-28 DIAGNOSIS — E785 Hyperlipidemia, unspecified: Secondary | ICD-10-CM

## 2012-01-28 DIAGNOSIS — N401 Enlarged prostate with lower urinary tract symptoms: Secondary | ICD-10-CM

## 2012-01-28 DIAGNOSIS — R0609 Other forms of dyspnea: Secondary | ICD-10-CM

## 2012-01-28 DIAGNOSIS — N138 Other obstructive and reflux uropathy: Secondary | ICD-10-CM

## 2012-01-28 DIAGNOSIS — N289 Disorder of kidney and ureter, unspecified: Secondary | ICD-10-CM

## 2012-01-28 DIAGNOSIS — R0989 Other specified symptoms and signs involving the circulatory and respiratory systems: Secondary | ICD-10-CM

## 2012-01-28 DIAGNOSIS — E669 Obesity, unspecified: Secondary | ICD-10-CM

## 2012-01-28 LAB — CBC WITH DIFFERENTIAL/PLATELET
Basophils Absolute: 0 10*3/uL (ref 0.0–0.1)
Eosinophils Relative: 3.1 % (ref 0.0–5.0)
Lymphocytes Relative: 29.3 % (ref 12.0–46.0)
Monocytes Relative: 10.7 % (ref 3.0–12.0)
Neutrophils Relative %: 56.4 % (ref 43.0–77.0)
Platelets: 222 10*3/uL (ref 150.0–400.0)
RDW: 13.2 % (ref 11.5–14.6)
WBC: 9.9 10*3/uL (ref 4.5–10.5)

## 2012-01-28 LAB — COMPREHENSIVE METABOLIC PANEL
Albumin: 4 g/dL (ref 3.5–5.2)
CO2: 27 mEq/L (ref 19–32)
Calcium: 9.2 mg/dL (ref 8.4–10.5)
Chloride: 104 mEq/L (ref 96–112)
GFR: 66.18 mL/min (ref >60.00)
Glucose, Bld: 109 mg/dL — ABNORMAL HIGH (ref 70–99)
Potassium: 4.2 mEq/L (ref 3.5–5.1)
Sodium: 140 mEq/L (ref 135–145)
Total Bilirubin: 0.6 mg/dL (ref 0.3–1.2)
Total Protein: 7.5 g/dL (ref 6.0–8.3)

## 2012-01-28 LAB — LIPID PANEL
Cholesterol: 187 mg/dL (ref 0–200)
VLDL: 34 mg/dL (ref 0.0–40.0)

## 2012-01-28 NOTE — Assessment & Plan Note (Addendum)
Good o2 sat, lungs clear.  No evidence of CHF on exam. May need sleep study or echo to further eval if not improved with antihistamine. Could be from seasonal allergies.  rec antihistamine and update me if not improving.

## 2012-01-28 NOTE — Patient Instructions (Addendum)
Zyrtec or claritin for allergy symptoms.  If shortness of breath not improving, return for further evaluation. May use zantac (ranitidine) or pepcid (famotidine) over the counter for reflux symptoms. Head of bed elevated. Avoidance of citrus, fatty foods, chocolate, peppermint, and excessive alcohol, along with sodas, orange juice (acidic drinks) At least a few hours between dinner and bed, minimize naps after eating. blood work today. Return when next due for physical. Good to see you today, call us with quesitons.

## 2012-01-28 NOTE — Assessment & Plan Note (Signed)
BP Readings from Last 3 Encounters:  01/28/12 136/96  12/10/10 156/96  elevated last year, per pt no h/o HTN but HTN dx in chart.  Continue to monitor.

## 2012-01-28 NOTE — Assessment & Plan Note (Signed)
Encouraged continued weight loss, discussed dietary changes (suggested change from soda to water) and encouraged increased activity, per PT direction given recent surgeries.

## 2012-01-28 NOTE — Assessment & Plan Note (Signed)
Pt feels stable off jalyn right now, continue to monitor.

## 2012-01-28 NOTE — Progress Notes (Signed)
Subjective:    Patient ID: Kevin Campbell, male    DOB: October 09, 1946, 66 y.o.   MRN: [id number]  HPI CC: new pt to establish  Presents with wife, prior saw Dr. Glenis Smoker.  bp up today, denies h/o HTN or elevated BP in past.  injured left knee 08/2011 - stepped in hole on job, tore bilateral ligaments s/p surgery 11/02/2011.  Injured right shoulder 07/2011 tore biceps surgery, fell off ladder.  Started PT this week.  S/p shoulder surgery 01/18/2012  Worker's comp since December, out of work currently.  Ortho is Dr. Thomasena Edis in [place] [facility].  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  Increasing SOB with cough - started after knee surgery 10/2011.  Changed air freshener and initially improved, then now coming back.  Tried sister's breathing treatment which did help.  No fever/chills, palpitation, leg swelling, CP/tightness. + h/o smoking cigars, never cigarettes, quit 1992.  O2 sat after surgery was 92, but increased to 96% prior to D/C.  H/o seasonal allergies, noticing increased rhinorrhea as well as PNdrainage recently.  Denies PNDyspnea.  No snoring.  Wife denies apneic episode.  Restorative sleep.  No daytime somnolence when working.  Sleeps on 2 pillows.  Worsening heartburn recently, esp bad with spicy foods at night.  Uses milk to control sxs.  Has tried zantac in past which controlled sxs.  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 Colonoscopy - 2011 [name], x1 polyp Prostate checked 2 yrs ago.  Decreased stream.  Nocturia x2.  initially on jalyn which helped but pt stopped. No recent tetanus shot. Gets flu shot yearly.  Caffeine: 2 cups coffee occasionally, soda throughout the day Lives with wife, no pets.  2 grown children Occupation: truck driver Edu: HS Activity: sedentary Diet: rare water, good fruits and vegetables  Medications and allergies reviewed and updated in chart.  Past histories reviewed and updated if relevant as below. Patient Active Problem List    Diagnoses  . HYPERCHOLESTEROLEMIA  . DYSLIPIDEMIA  . OBESITY  . HYPERTENSION  . GERD  . KIDNEY DISEASE  . BENIGN PROSTATIC HYPERTROPHY, WITH OBSTRUCTION  . CHEST PAIN   Past Medical History  Diagnosis Date  . Arthritis   . Seasonal allergies   . Colon polyps 2011    [name]  . Enlarged prostate   . History of chicken pox    Past Surgical History  Procedure Date  . Knee surgery 2013    Left  . Shoulder surgery 2013    Right  . Clavicle surgery 1968    Left  . Colonoscopy 2011    1 polyp, [name] at [facility]   History  Substance Use Topics  . Smoking status: Former Smoker    Quit date: 10/11/1990  . Smokeless tobacco: Never Used  . Alcohol Use: No   Family History  Problem Relation Age of Onset  . Coronary artery disease Mother     3vCABG  . Arthritis Mother   . Cancer Father     lung, smoker  . Cancer Sister     twin sister - breast  . Stroke Neg Hx   . Diabetes Neg Hx    Allergies  Allergen Reactions  . Benadryl (Altaryl) Shortness Of Breath  . Celebrex (Celecoxib) Other (See Comments)    Extreme drowsiness  . Codeine Other (See Comments)    Extreme fatigue; dizziness   No current outpatient prescriptions on file prior to visit.     Review of Systems  Constitutional: Positive for unexpected weight  change (10lb weight gain, very sedentary). Negative for fever, chills, activity change, appetite change and fatigue.  HENT: Negative for hearing loss and neck pain.   Eyes: Negative for visual disturbance.  Respiratory: Positive for cough and shortness of breath. Negative for chest tightness and wheezing.   Cardiovascular: Negative for chest pain, palpitations and leg swelling.  Gastrointestinal: Negative for nausea, vomiting, abdominal pain, diarrhea, constipation, blood in stool and abdominal distention.  Genitourinary: Negative for hematuria and difficulty urinating.  Musculoskeletal: Negative for myalgias and arthralgias.  Skin: Negative for rash.   Neurological: Negative for dizziness, seizures, syncope and headaches.  Hematological: Does not bruise/bleed easily.  Psychiatric/Behavioral: Negative for dysphoric mood. The patient is not nervous/anxious.        Objective:   Physical Exam  Nursing note and vitals reviewed. Constitutional: He is oriented to person, place, and time. He appears well-developed and well-nourished. No distress.       central obesity  HENT:  Head: Normocephalic and atraumatic.  Right Ear: External ear normal.  Left Ear: External ear normal.  Nose: Nose normal.  Mouth/Throat: No oropharyngeal exudate.  Eyes: Conjunctivae and EOM are normal. Pupils are equal, round, and reactive to light. No scleral icterus.  Neck: Normal range of motion. Neck supple. No JVD present. Carotid bruit is not present. No thyromegaly present.  Cardiovascular: Normal rate, regular rhythm, normal heart sounds and intact distal pulses.   No murmur heard. Pulses:      Radial pulses are 2+ on the right side, and 2+ on the left side.  Pulmonary/Chest: Effort normal and breath sounds normal. No respiratory distress. He has no wheezes. He has no rales.  Abdominal: Soft. Bowel sounds are normal. He exhibits no distension and no mass. There is no tenderness. There is no rebound and no guarding.  Musculoskeletal: Normal range of motion. He exhibits no edema.  Lymphadenopathy:    He has no cervical adenopathy.  Neurological: He is alert and oriented to person, place, and time.       CN grossly intact, station and gait intact  Skin: Skin is warm and dry. No rash noted.       Bruise right lateral arm from surgery  Psychiatric: He has a normal mood and affect. His behavior is normal. Judgment and thought content normal.      Assessment & Plan:

## 2012-01-28 NOTE — Assessment & Plan Note (Signed)
GERD precautions reviewed. Discussed restarting zantac or pepcid.

## 2012-01-28 NOTE — Assessment & Plan Note (Signed)
Lab Results  Component Value Date   CREATININE 1.43 11/16/2010  recheck Cr today.

## 2012-01-28 NOTE — Assessment & Plan Note (Signed)
This dx in chart - check FLP as fasting today.

## 2012-01-30 ENCOUNTER — Encounter: Payer: Self-pay | Admitting: Family Medicine

## 2012-02-13 ENCOUNTER — Encounter: Payer: Self-pay | Admitting: Family Medicine

## 2012-02-27 ENCOUNTER — Encounter: Payer: Self-pay | Admitting: Family Medicine

## 2012-02-27 ENCOUNTER — Telehealth: Payer: Self-pay | Admitting: Family Medicine

## 2012-02-27 DIAGNOSIS — R739 Hyperglycemia, unspecified: Secondary | ICD-10-CM

## 2012-02-27 DIAGNOSIS — R972 Elevated prostate specific antigen [PSA]: Secondary | ICD-10-CM

## 2012-02-27 NOTE — Telephone Encounter (Signed)
plz notify I reviewed records from prior PCP, did have elevated prostate level last check 10/2011.  I would like him to return sometime this month to recheck PSA - no need to fast.  Will also check A1c as sugars have been a bit elevated. If abnormal, will want him to come in for discussion. Is he still on Jalyn, if not how long has he been off (prostate med)?

## 2012-02-28 NOTE — Telephone Encounter (Signed)
Patient notified and lab appt scheduled. He has not taken the Jalyn since his shoulder surgery in April.

## 2012-02-29 ENCOUNTER — Encounter: Payer: Self-pay | Admitting: Family Medicine

## 2012-03-03 ENCOUNTER — Telehealth: Payer: Self-pay | Admitting: Radiology

## 2012-03-03 ENCOUNTER — Other Ambulatory Visit (INDEPENDENT_AMBULATORY_CARE_PROVIDER_SITE_OTHER): Payer: BC Managed Care – PPO

## 2012-03-03 DIAGNOSIS — R739 Hyperglycemia, unspecified: Secondary | ICD-10-CM

## 2012-03-03 DIAGNOSIS — R7309 Other abnormal glucose: Secondary | ICD-10-CM

## 2012-03-03 DIAGNOSIS — R972 Elevated prostate specific antigen [PSA]: Secondary | ICD-10-CM

## 2012-03-03 LAB — HEMOGLOBIN A1C: Hgb A1c MFr Bld: 6 % (ref 4.6–6.5)

## 2012-03-03 NOTE — Telephone Encounter (Signed)
I don't recommend any pills or supplements to lose weight.  rec cutting down on portion sizes and increasing activity.

## 2012-03-03 NOTE — Telephone Encounter (Signed)
Patient notified

## 2012-03-03 NOTE — Telephone Encounter (Signed)
Patient came in today for a blood draw, he wants information from you on diet supplements he can take to lose weight. He is on medication and wants to take something that won't interfer

## 2012-03-04 ENCOUNTER — Encounter: Payer: Self-pay | Admitting: Family Medicine

## 2012-03-04 DIAGNOSIS — R7303 Prediabetes: Secondary | ICD-10-CM | POA: Insufficient documentation

## 2012-03-04 DIAGNOSIS — E119 Type 2 diabetes mellitus without complications: Secondary | ICD-10-CM | POA: Insufficient documentation

## 2012-03-21 ENCOUNTER — Encounter: Payer: Self-pay | Admitting: Family Medicine

## 2012-03-21 ENCOUNTER — Ambulatory Visit (INDEPENDENT_AMBULATORY_CARE_PROVIDER_SITE_OTHER): Payer: BC Managed Care – PPO | Admitting: Family Medicine

## 2012-03-21 VITALS — BP 130/82 | HR 72 | Temp 97.9°F | Wt 251.5 lb

## 2012-03-21 DIAGNOSIS — R972 Elevated prostate specific antigen [PSA]: Secondary | ICD-10-CM

## 2012-03-21 DIAGNOSIS — C61 Malignant neoplasm of prostate: Secondary | ICD-10-CM | POA: Insufficient documentation

## 2012-03-21 DIAGNOSIS — R7303 Prediabetes: Secondary | ICD-10-CM

## 2012-03-21 DIAGNOSIS — R7309 Other abnormal glucose: Secondary | ICD-10-CM

## 2012-03-21 NOTE — Patient Instructions (Signed)
Prostate level better but still slightly high.  Prostate exam ok today.   Return in 4 months for recheck of PSA (lab visit for blood test).   Keep eye for worsening urinary symptoms, if that happens let me know. Keep eye on sugars - change from white breads and starches to whole grain.  Good job with backing of sodas, get more water.

## 2012-03-21 NOTE — Progress Notes (Signed)
  Subjective:    Patient ID: Kevin Campbell, male    DOB: [birth year], 66 y.o.   MRN: [id number]  HPI CC: f/u labs  Elevated PSA - h/o elevated PSA in past.  Was taking jalyn in past, hasn't for last 2 month.  Has never seen urologist.  Denies abd pain, dysuria.  Some weak stream.  Nocturia x1.  Urinary sxs not really bothersome.  Last DRE was 3-4 yrs ago.  prediabetes - discussed this.  Has made changes - backed off sodas and sweets.   Wt Readings from Last 3 Encounters:  03/21/12 251 lb 8 oz (114.08 kg)  01/28/12 260 lb 12 oz (118.275 kg)  12/10/10 251 lb (113.853 kg)   Lab Results  Component Value Date   HGBA1C 6.0 03/03/2012    Family History  Problem Relation Age of Onset  . Coronary artery disease Mother     3vCABG  . Arthritis Mother   . Cancer Father     lung, smoker  . Cancer Sister     twin sister - breast  . Stroke Neg Hx   . Diabetes Neg Hx     Review of Systems Per HPI    Objective:   Physical Exam  Nursing note and vitals reviewed. Genitourinary: Rectum normal and prostate normal. Rectal exam shows no external hemorrhoid, no internal hemorrhoid, no fissure, no mass, no tenderness and anal tone normal. Prostate is not enlarged (20gm) and not tender.       Stool in vault  Psychiatric: He has a normal mood and affect.       Assessment & Plan:

## 2012-03-21 NOTE — Assessment & Plan Note (Signed)
Discussed this dx as well as healthy diet and lifestyle changes

## 2012-03-21 NOTE — Progress Notes (Signed)
Addended by: Eustaquio Boyden on: 03/21/2012 04:25 PM   Modules accepted: Level of Service

## 2012-03-21 NOTE — Assessment & Plan Note (Signed)
In h/o BPH, however not significantly enlarged on exam. Will have pt return for rpt PSA in 4-5 mo (reassuring PSA has dropped). Off jalyn for last 2-3 months, so doubt falsely low. Discussed ddx of elevated PSA including cancer.  Pt comfortable waiting for 4-5 mo.

## 2012-07-11 ENCOUNTER — Ambulatory Visit (INDEPENDENT_AMBULATORY_CARE_PROVIDER_SITE_OTHER)
Admission: RE | Admit: 2012-07-11 | Discharge: 2012-07-11 | Disposition: A | Discharge status: Home / Self Care | Payer: BC Managed Care – PPO | Source: Ambulatory Visit | Attending: Family Medicine | Admitting: Family Medicine

## 2012-07-11 ENCOUNTER — Ambulatory Visit (INDEPENDENT_AMBULATORY_CARE_PROVIDER_SITE_OTHER): Payer: BC Managed Care – PPO | Admitting: Family Medicine

## 2012-07-11 ENCOUNTER — Other Ambulatory Visit: Payer: BC Managed Care – PPO

## 2012-07-11 ENCOUNTER — Encounter: Payer: Self-pay | Admitting: Family Medicine

## 2012-07-11 VITALS — BP 144/90 | HR 76 | Temp 98.2°F | Wt 242.5 lb

## 2012-07-11 DIAGNOSIS — Z01818 Encounter for other preprocedural examination: Secondary | ICD-10-CM | POA: Insufficient documentation

## 2012-07-11 DIAGNOSIS — R972 Elevated prostate specific antigen [PSA]: Secondary | ICD-10-CM

## 2012-07-11 DIAGNOSIS — N401 Enlarged prostate with lower urinary tract symptoms: Secondary | ICD-10-CM

## 2012-07-11 LAB — CBC WITH DIFFERENTIAL/PLATELET
Basophils Absolute: 0 10*3/uL (ref 0.0–0.1)
HCT: 45.8 % (ref 39.0–52.0)
Hemoglobin: 15.2 g/dL (ref 13.0–17.0)
Lymphs Abs: 3 10*3/uL (ref 0.7–4.0)
MCV: 90.8 fl (ref 78.0–100.0)
Monocytes Absolute: 0.8 10*3/uL (ref 0.1–1.0)
Monocytes Relative: 10.6 % (ref 3.0–12.0)
Neutro Abs: 3.9 10*3/uL (ref 1.4–7.7)
RDW: 12.5 % (ref 11.5–14.6)

## 2012-07-11 LAB — BASIC METABOLIC PANEL
CO2: 27 mEq/L (ref 19–32)
Calcium: 9.5 mg/dL (ref 8.4–10.5)
Creatinine, Ser: 1.3 mg/dL (ref 0.4–1.5)
Glucose, Bld: 94 mg/dL (ref 70–99)

## 2012-07-11 LAB — PROTIME-INR: INR: 1.1 ratio — ABNORMAL HIGH (ref 0.8–1.0)

## 2012-07-11 NOTE — Assessment & Plan Note (Signed)
H/o this, stable off jalyn.  Last DRE without significant BPH however.  Await rpt PSA.

## 2012-07-11 NOTE — Assessment & Plan Note (Signed)
Trend PSA today, along with free PSA. Off jalyn for 6 mo now.

## 2012-07-11 NOTE — Progress Notes (Signed)
Subjective:    Patient ID: Kevin Campbell, male    DOB: Jan 04, 1946, 66 y.o.   MRN: [id number]  HPI CC: preop clearance  Presents with wife today.  Mr. Roza is a pleasant 61 yo with h/o severe L knee OA, HLD, HTN, obesity, prediabetes, BPH and nonobstructive CAD who presents today for preop clearance for L TKR by Dr. Thomasena Edis, pending scheduling.  Activity impairing L knee pain.  Trouble sleeping at night 2/2 knee pain.  Lost 9 more pounds since last visit!  Has backed off sodas and white breads/carbs. Wt Readings from Last 3 Encounters:  07/11/12 242 lb 8 oz (109.997 kg)  03/21/12 251 lb 8 oz (114.08 kg)  01/28/12 260 lb 12 oz (118.275 kg)   Works in yard for 1 hour at a time.  No SOB with this.  Overall sedentary lifestyle.  Physical activity limited by L knee pain. Has had prior L knee and R shoulder surgeries 2013, no problems with anesthesia in past.   Once did have low oxygen level so surgery was postponed 2 wks.  No smokers at home.  Pt quit smoking 1992 (cigars). No EtOH use. Received flu shot last month. Tetanus - Thinks last done about 2-3 yrs ago.  Elevated PSA - increase noted last check, but did decrease afterwards.  Would like rechecked today.  Benign rectal exam last visit.  Prior on jalyn, off for last 6+ months.  Has never seen urologist. Lab Results  Component Value Date   PSA 4.59* 03/03/2012   PSA 5.7 11/08/2011   PSA 3.98 03/20/2010    Medications and allergies reviewed and updated in chart.  Past histories reviewed and updated if relevant as below. Patient Active Problem List  Diagnosis  . DYSLIPIDEMIA  . OBESITY  . HYPERTENSION  . GERD  . KIDNEY DISEASE  . BENIGN PROSTATIC HYPERTROPHY, WITH OBSTRUCTION  . CHEST PAIN  . Dyspnea  . Obesity  . Prediabetes  . Elevated PSA  . Preoperative clearance   Past Medical History  Diagnosis Date  . Arthritis   . Seasonal allergies   . Colon polyps 2011    [name]  . BPH (benign prostatic  hypertrophy)   . History of chicken pox   . GERD (gastroesophageal reflux disease)   . CAD (coronary artery disease) 2007    cath, nonobstructive  . Obesity   . Prediabetes   . Elevated PSA    Past Surgical History  Procedure Date  . Knee surgery 2013    Left - torn ligaments  . Shoulder surgery 2013    Right - biceps tear  . Clavicle surgery 1968    Left  . Colonoscopy 2011    1 polyp sigmoid (hyperplastic), diverticulosis, int hemorrhoids [name] at [facility])  . Cardiovascular stress test 11/2010    Normal lexiscan nuclear study with inferior thinning but no ischemia   History  Substance Use Topics  . Smoking status: Former Smoker    Quit date: 10/11/1990  . Smokeless tobacco: Never Used  . Alcohol Use: No   Family History  Problem Relation Age of Onset  . Coronary artery disease Mother 78    3vCABG  . Arthritis Mother   . Cancer Father     lung, smoker  . Cancer Sister     twin sister - breast  . Stroke Neg Hx   . Diabetes Neg Hx    Allergies  Allergen Reactions  . Benadryl (Diphenhydramine Hcl) Shortness Of Breath  . Celebrex (Celecoxib)  Other (See Comments)    Extreme drowsiness  . Codeine Other (See Comments)    Extreme fatigue; dizziness   Current Outpatient Prescriptions on File Prior to Visit  Medication Sig Dispense Refill  . Ergocalciferol (VITAMIN D2) 2000 UNITS TABS Take 1 tablet by mouth daily.      . ranitidine (ZANTAC) 150 MG capsule Take 150 mg by mouth 2 (two) times daily.          Review of Systems No recent HA, vision changes, chest pain or tightness, shortness of breath, LOC, leg swelling other than at left knee, no palpitations, no dizziness. Denies snoring, apenic episodes at night, PNDyspnea, daytime somnolence.    Objective:   Physical Exam  Nursing note and vitals reviewed. Constitutional: He appears well-developed and well-nourished.       obese  HENT:  Head: Normocephalic and atraumatic.  Mouth/Throat: Oropharynx is clear and  moist. No oropharyngeal exudate.  Eyes: Conjunctivae normal and EOM are normal. Pupils are equal, round, and reactive to light. No scleral icterus.  Neck: Normal range of motion. Neck supple. Carotid bruit is not present.  Cardiovascular: Normal rate, regular rhythm, normal heart sounds and intact distal pulses.   No murmur heard. Pulmonary/Chest: Effort normal and breath sounds normal. No respiratory distress. He has no wheezes. He has no rales.  Genitourinary:       deferred  Musculoskeletal: He exhibits no edema.  Lymphadenopathy:    He has no cervical adenopathy.  Skin: Skin is warm and dry. No rash noted.  Psychiatric: He has a normal mood and affect.       Assessment & Plan:

## 2012-07-11 NOTE — Patient Instructions (Signed)
I do recommend starting baby aspirin 81mg  daily enteric coated (EC) unless Dr. Thomasena Edis recommends against this. Chest xray today as well as EKG today. We will fax results to Dr. Thomasena Edis. Keep an eye on blood pressure - if consistently >140/90, please let me know. PSA recheck today. Good to see you today, call us with questions.

## 2012-07-11 NOTE — Assessment & Plan Note (Addendum)
Discussed risks vs benefits of surgery.  Aware of risks of general anesthesia. reassuring that he's tolerated recent surgeries well this past year. Pulse ox 95% today, check CXR - clear on my read. EKG today - NSR, rate of 64, normal axis, intervals, no acute ST/T changes. Blood work today. Will fax all results to Dr. Thomasena Edis. In h/o nonobstructive CAD last cath 2007, I did recommend restarting baby aspirin, but will defer final decision to Dr. Thomasena Edis.  Currently no sxs indicative of angina.  Last stress test negative for ischemia 11/2010.

## 2012-07-12 ENCOUNTER — Other Ambulatory Visit: Payer: Self-pay | Admitting: Family Medicine

## 2012-07-12 DIAGNOSIS — R972 Elevated prostate specific antigen [PSA]: Secondary | ICD-10-CM

## 2012-07-12 LAB — PSA, TOTAL AND FREE
PSA, Free Pct: 13 % — ABNORMAL LOW (ref >25)
PSA, Free: 0.76 ng/mL
PSA: 5.75 ng/mL — ABNORMAL HIGH (ref <=4.00)

## 2012-07-21 ENCOUNTER — Other Ambulatory Visit: Payer: BC Managed Care – PPO

## 2012-08-10 ENCOUNTER — Encounter: Payer: Self-pay | Admitting: Family Medicine

## 2012-08-24 DIAGNOSIS — C61 Malignant neoplasm of prostate: Secondary | ICD-10-CM

## 2012-08-24 HISTORY — PX: PROSTATE BIOPSY: SHX241

## 2012-08-24 HISTORY — DX: Malignant neoplasm of prostate: C61

## 2012-09-03 ENCOUNTER — Encounter: Payer: Self-pay | Admitting: Family Medicine

## 2012-09-04 ENCOUNTER — Encounter: Payer: Self-pay | Admitting: Family Medicine

## 2012-09-04 ENCOUNTER — Other Ambulatory Visit: Payer: Self-pay | Admitting: Pain Medicine

## 2012-09-04 ENCOUNTER — Ambulatory Visit (INDEPENDENT_AMBULATORY_CARE_PROVIDER_SITE_OTHER): Payer: BC Managed Care – PPO | Admitting: Family Medicine

## 2012-09-04 VITALS — BP 122/76 | HR 72 | Temp 98.2°F | Wt 241.8 lb

## 2012-09-04 DIAGNOSIS — J209 Acute bronchitis, unspecified: Secondary | ICD-10-CM

## 2012-09-04 DIAGNOSIS — J019 Acute sinusitis, unspecified: Secondary | ICD-10-CM | POA: Insufficient documentation

## 2012-09-04 MED ORDER — AZITHROMYCIN 250 MG PO TABS: ORAL_TABLET | ORAL | Status: DC

## 2012-09-04 MED ORDER — HYDROCODONE-HOMATROPINE 5-1.5 MG/5ML PO SYRP: 5.0000 mL | ORAL_SOLUTION | Freq: Every evening | ORAL | Status: DC | PRN

## 2012-09-04 NOTE — Patient Instructions (Signed)
I do think you have a bronchitis.  Treat with zpack as well as hycodan cough syrup at night time.  May use robitussin during the day. Also use aleve for inflammation and consider simple mucinex with plenty of water to mobilize mucous out. Let us know if not improving as expected , fever >101 or worsening productive cough. I hope you start feeling better.

## 2012-09-04 NOTE — Progress Notes (Signed)
  Subjective:    Patient ID: Kevin Campbell, male    DOB: 06/11/1946, 66 y.o.   MRN: [id number]  HPI CC: cold  6d h/o cold symptoms, gradually getting worse.  Thinks now in chest.  Feels chest > head congestion.  rattly cough.  + HA.  So far has tried robitussin which slows cough down.  No fevers/chills, ear or tooth pain, PNDrainage.  Niece and her baby sick recently No h/o asthma, COPD Ex smoker. Did receive flu shot this season.  Recently saw Dr. Vernie Ammons for newly dx prostate cancer.  Deciding on options.  Planning on returning to see Uro 09/14/2012. L TKR planned for 09/26/2012 at [facility].  Wants to optimize health prior to this.  Past Medical History  Diagnosis Date  . Arthritis   . Seasonal allergies   . Colon polyps 2011    [name]  . BPH (benign prostatic hypertrophy)   . History of chicken pox   . GERD (gastroesophageal reflux disease)   . CAD (coronary artery disease) 2007    cath, nonobstructive  . Obesity   . Prediabetes   . Adenocarcinoma of prostate     gleason 4+3 = 7 in 2 cores, 3+4 = 7 in 2 cores, 3+3 = 6 in 1 core all from L lobe (Ottelin)     Review of Systems Per HPI    Objective:   Physical Exam  Nursing note and vitals reviewed. Constitutional: He appears well-developed and well-nourished. No distress.  HENT:  Head: Normocephalic and atraumatic.  Right Ear: Hearing, tympanic membrane, external ear and ear canal normal.  Left Ear: Hearing, tympanic membrane, external ear and ear canal normal.  Nose: Nose normal. No mucosal edema or rhinorrhea. Right sinus exhibits no maxillary sinus tenderness and no frontal sinus tenderness. Left sinus exhibits no maxillary sinus tenderness and no frontal sinus tenderness.  Mouth/Throat: Uvula is midline and mucous membranes are normal. Posterior oropharyngeal erythema present. No oropharyngeal exudate, posterior oropharyngeal edema or tonsillar abscesses.  Eyes: Conjunctivae normal and EOM are normal.  Pupils are equal, round, and reactive to light. No scleral icterus.  Neck: Normal range of motion. Neck supple.  Cardiovascular: Normal rate, regular rhythm, normal heart sounds and intact distal pulses.   No murmur heard. Pulmonary/Chest: Effort normal and breath sounds normal. No respiratory distress. He has no wheezes. He has no rales.       Rattling cough present  Musculoskeletal: He exhibits no edema.  Lymphadenopathy:    He has no cervical adenopathy.  Skin: Skin is warm and dry. No rash noted.      Assessment & Plan:

## 2012-09-04 NOTE — Assessment & Plan Note (Signed)
Given comorbidities, upcoming surgery, and relative hypoxia (pulse ox on RA 93% today), will treat aggressively with zpack. rec hycodan and NSAIDs for symptomatic care. Red flags to return discussed. Pt agrees with plan.

## 2012-09-12 ENCOUNTER — Telehealth: Payer: Self-pay

## 2012-09-12 NOTE — Telephone Encounter (Signed)
Noted.  Recommend start OTC claritin or zyrtec daily to help dry out rhinorrhea. Also recommend otc simple mucinex with plenty of fluid for chest congestion.

## 2012-09-12 NOTE — Telephone Encounter (Signed)
Pt seen 09/04/12; pt has finished Zpack and robitussin helping productive cough with white phlegm. Runny nose really bothering pt. Pt has not taken mucinex. Pt scheduled 2 weeks for knee replacement and request med for runny noise and chest congestion to [facility] [address].No fever.Please advise.

## 2012-09-12 NOTE — Telephone Encounter (Signed)
Patient notified as instructed by telephone. 

## 2012-09-15 ENCOUNTER — Other Ambulatory Visit: Payer: Self-pay

## 2012-09-18 ENCOUNTER — Telehealth: Payer: Self-pay | Admitting: Family Medicine

## 2012-09-18 ENCOUNTER — Ambulatory Visit (INDEPENDENT_AMBULATORY_CARE_PROVIDER_SITE_OTHER): Payer: BC Managed Care – PPO | Admitting: Family Medicine

## 2012-09-18 ENCOUNTER — Encounter: Payer: Self-pay | Admitting: Family Medicine

## 2012-09-18 VITALS — BP 144/86 | HR 72 | Temp 98.1°F | Wt 251.2 lb

## 2012-09-18 DIAGNOSIS — J019 Acute sinusitis, unspecified: Secondary | ICD-10-CM

## 2012-09-18 MED ORDER — AMOXICILLIN-POT CLAVULANATE 875-125 MG PO TABS: 1.0000 | ORAL_TABLET | Freq: Two times a day (BID) | ORAL | Status: AC

## 2012-09-18 NOTE — Progress Notes (Signed)
  Subjective:    Patient ID: Kevin Campbell, male    DOB: 07/10/1946, 66 y.o.   MRN: [id number]  HPI CC: continued ill  Seen 09/04/2012 with acute bronchitis, treated with zpack and hycodan.  Continued drainage despite this - treated with mucinex and zyrtec.  Cough syrup helping as well (robitussin), hycodan caused hung over feeling.  Continues to feel sinus congestion and PNdrainage.  In AM blowing nose with green mucous.  Cough still intermittently present.  No fevers/chills, HA, facial pain or tooth pain.  sxs started around 08/29/2012.  Going on almost 3 wks now.  No h/o asthma, COPD  Ex smoker.  Did receive flu shot this season.  L TKR planned for 09/26/2012.  Wants to feel better for this.  Past Medical History  Diagnosis Date  . Arthritis   . Seasonal allergies   . Colon polyps 2011    [name]  . BPH (benign prostatic hypertrophy)   . History of chicken pox   . GERD (gastroesophageal reflux disease)   . CAD (coronary artery disease) 2007    cath, nonobstructive  . Obesity   . Prediabetes   . Adenocarcinoma of prostate     gleason 4+3 = 7 in 2 cores, 3+4 = 7 in 2 cores, 3+3 = 6 in 1 core all from L lobe (Ottelin)     Review of Systems Per HPI    Objective:   Physical Exam  Nursing note and vitals reviewed. Constitutional: He appears well-developed and well-nourished. No distress.  HENT:  Head: Normocephalic and atraumatic.  Right Ear: Hearing, tympanic membrane, external ear and ear canal normal.  Left Ear: Hearing, tympanic membrane, external ear and ear canal normal.  Nose: Mucosal edema (and erythema) present. No rhinorrhea. Right sinus exhibits no maxillary sinus tenderness and no frontal sinus tenderness. Left sinus exhibits no maxillary sinus tenderness and no frontal sinus tenderness.  Mouth/Throat: Uvula is midline and mucous membranes are normal. Posterior oropharyngeal erythema present. No oropharyngeal exudate, posterior oropharyngeal edema or  tonsillar abscesses.       Purulent nasal discharge  Eyes: Conjunctivae normal and EOM are normal. Pupils are equal, round, and reactive to light. No scleral icterus.  Neck: Normal range of motion. Neck supple.  Cardiovascular: Normal rate, regular rhythm, normal heart sounds and intact distal pulses.   No murmur heard. Pulmonary/Chest: Effort normal and breath sounds normal. No respiratory distress. He has no wheezes. He has no rales.  Lymphadenopathy:    He has no cervical adenopathy.  Skin: Skin is warm and dry. No rash noted.       Assessment & Plan:

## 2012-09-18 NOTE — Assessment & Plan Note (Signed)
Bronchitis component resolved, now with persistent sinus congestion/drainage and purulent dishcarge. Recommend treat with augmentin twice daily for 10 days to cover sinusitis. Pt agrees with plan.  Advised him to let anesthesiology know about abx.

## 2012-09-18 NOTE — Patient Instructions (Signed)
You initially had a bronchitis, now have a sinus infection. Take medicine as prescribed: augmentin twice daily for 10 days. Push fluids and plenty of rest. Nasal saline irrigation or neti pot to help drain sinuses. May use simple mucinex with plenty of fluid to help mobilize mucous. Let the ortho doctor know about your recent antibiotics. Let us know if fever >101.5, trouble opening/closing mouth, difficulty swallowing, or worsening - you may need to be seen again.

## 2012-09-18 NOTE — Telephone Encounter (Signed)
Patient Information:  Caller Name: Renny  Phone: [phone]  Patient: Kevin Campbell, Kevin Campbell  Gender: Male  DOB: [birth year]  Age: 66 Years  PCP: Eustaquio Boyden [facility])   Symptoms  Reason For Call & Symptoms: Patient states he is taking the claritin and muxinex x 1weeks as advised by MD.  Patient states he has surgery for knee replacement scheduled for 09/26/12 and needs to be well.  Reviewed Health History In EMR: Yes  Reviewed Medications In EMR: Yes  Reviewed Allergies In EMR: Yes  Reviewed Surgeries / Procedures: Yes  Date of Onset of Symptoms: 09/04/2012  Treatments Tried: Claritin and muxinex  Treatments Tried Worked: No  Guideline(s) Used:  Sinus Pain and Congestion  Disposition Per Guideline:   See Today or Tomorrow in Office  Reason For Disposition Reached:   Sinus congestion (pressure, fullness) present > 10 days  Advice Given:  Reassurance:   Sinus congestion is a normal part of a cold.  Office Follow Up:  Does the office need to follow up with this patient?: No  Instructions For The Office: N/A  Appointment Scheduled:  09/18/2012 14:15:00 Appointment Scheduled Provider:  Eustaquio Boyden [facility])

## 2012-09-18 NOTE — Telephone Encounter (Signed)
Will see today.  

## 2012-09-19 ENCOUNTER — Encounter (HOSPITAL_COMMUNITY): Payer: Self-pay | Admitting: Pharmacy Technician

## 2012-09-20 ENCOUNTER — Encounter (HOSPITAL_COMMUNITY)
Admission: RE | Admit: 2012-09-20 | Discharge: 2012-09-20 | Disposition: A | Discharge status: Home / Self Care | Payer: Worker's Compensation | Source: Ambulatory Visit | Attending: Specialist | Admitting: Specialist

## 2012-09-20 ENCOUNTER — Encounter (HOSPITAL_COMMUNITY): Payer: Self-pay

## 2012-09-20 LAB — ABO/RH: ABO/RH(D): O POS

## 2012-09-20 LAB — URINALYSIS, ROUTINE W REFLEX MICROSCOPIC
Protein, ur: NEGATIVE mg/dL
Urobilinogen, UA: 0.2 mg/dL (ref 0.0–1.0)

## 2012-09-20 LAB — CBC WITH DIFFERENTIAL/PLATELET
Basophils Relative: 0 % (ref 0–1)
HCT: 45.8 % (ref 39.0–52.0)
Hemoglobin: 16.1 g/dL (ref 13.0–17.0)
Lymphs Abs: 2.9 10*3/uL (ref 0.7–4.0)
MCHC: 35.2 g/dL (ref 30.0–36.0)
Monocytes Absolute: 0.9 10*3/uL (ref 0.1–1.0)
Monocytes Relative: 10 % (ref 3–12)
Neutro Abs: 4.8 10*3/uL (ref 1.7–7.7)
Neutrophils Relative %: 54 % (ref 43–77)
RBC: 5.42 MIL/uL (ref 4.22–5.81)

## 2012-09-20 LAB — COMPREHENSIVE METABOLIC PANEL
Albumin: 3.7 g/dL (ref 3.5–5.2)
Alkaline Phosphatase: 76 U/L (ref 39–117)
BUN: 7 mg/dL (ref 6–23)
CO2: 28 mEq/L (ref 19–32)
Chloride: 103 mEq/L (ref 96–112)
Creatinine, Ser: 1.31 mg/dL (ref 0.50–1.35)
Glucose, Bld: 96 mg/dL (ref 70–99)
Potassium: 3.9 mEq/L (ref 3.5–5.1)
Total Bilirubin: 0.7 mg/dL (ref 0.3–1.2)

## 2012-09-20 LAB — URINE MICROSCOPIC-ADD ON

## 2012-09-20 LAB — APTT: aPTT: 34 seconds (ref 24–37)

## 2012-09-20 LAB — SURGICAL PCR SCREEN: Staphylococcus aureus: NEGATIVE

## 2012-09-20 NOTE — Pre-Procedure Instructions (Signed)
09-20-12- EKG/CXR 10'13-Epic.W. Kennon Portela

## 2012-09-20 NOTE — Patient Instructions (Signed)
[address]  09/20/2012   Your procedure is scheduled on:  12-17 -2013  Report to [facility] at    0945    AM.  Call this number if you have problems the morning of surgery: [phone]  Or Presurgical Testing [phone](Dawsyn Ramsaran)   Remember: Follow any bowel prep instructions per MD office.   Do not eat food:After Midnight.  May have clear liquids:up to 6 Hours before arrival. Nothing after :0600 am  Clear liquids include soda, tea, black coffee, apple or grape juice, broth.  Take these medicines the morning of surgery with A SIP OF WATER: Amoxicillin   Do not wear jewelry, make-up or nail polish.  Do not wear lotions, powders, or perfumes. You may wear deodorant.  Do not shave 48 hours prior to surgery.(face and neck okay, no shaving of legs)  Do not bring valuables to the hospital.  Contacts, dentures or bridgework may not be worn into surgery.  Leave suitcase in the car. After surgery it may be brought to your room.  For patients admitted to the hospital, checkout time is 11:00 AM the day of discharge.   Patients discharged the day of surgery will not be allowed to drive home. Must have responsible person with you x 24 hours once discharged.  Name and phone number of your driver: Diane .spouse-[phone] cell  Special Instructions: CHG Shower Use Special Wash: see special instructions.(avoid face and genitals)   Please read over the following fact sheets that you were given: MRSA Information, Blood Transfusion fact sheet, Incentive Spirometry Instruction.    Failure to follow these instructions may result in Cancellation of your surgery.   Patient signature_______________________________________________________

## 2012-09-22 ENCOUNTER — Other Ambulatory Visit: Payer: Self-pay | Admitting: Pain Medicine

## 2012-09-22 NOTE — H&P (Signed)
TOTAL KNEE ADMISSION H&P  Patient is being admitted for left total knee arthroplasty.  Subjective:  Chief Complaint:left knee pain.  HPI: Kevin Campbell, 66 y.o. male, has a history of pain and functional disability in the left knee due to trauma and arthritis and has failed non-surgical conservative treatments for greater than 12 weeks to includeNSAID's and/or analgesics, corticosteriod injections, viscosupplementation injections, flexibility and strengthening excercises, supervised PT with diminished ADL's post treatment and activity modification.  Onset of symptoms was abrupt, starting 1 years ago with rapidlly worsening course since that time. The patient noted prior procedures on the knee to include  arthroscopy on the left knee(s).  Patient currently rates pain in the left knee(s) at 9 out of 10 with activity. Patient has night pain, worsening of pain with activity and weight bearing and pain that interferes with activities of daily living.  Patient has evidence of loose of joint space,sclerotic changes,osteophytes by imaging studies. This patient has had knee scope. There is no active infection.  Patient Active Problem List   Diagnosis Date Noted  . Acute sinusitis 09/04/2012  . Preoperative clearance 07/11/2012  . Primary prostate adenocarcinoma 03/21/2012  . Prediabetes   . DYSLIPIDEMIA 12/09/2010  . OBESITY 12/09/2010  . HYPERTENSION 12/09/2010  . GERD 12/09/2010  . KIDNEY DISEASE 12/09/2010  . BENIGN PROSTATIC HYPERTROPHY, WITH OBSTRUCTION 12/09/2010  . CHEST PAIN 12/09/2010   Past Medical History  Diagnosis Date  . Arthritis   . Seasonal allergies   . Colon polyps 2011    [name]  . BPH (benign prostatic hypertrophy)   . History of chicken pox   . GERD (gastroesophageal reflux disease)   . CAD (coronary artery disease) 2007    cath, nonobstructive  . Obesity   . Prediabetes   . Adenocarcinoma of prostate     gleason 4+3 = 7 in 2 cores, 3+4 = 7 in 2 cores, 3+3 = 6  in 1 core all from L lobe (Ottelin).Pt. to have tx. after knee surgery.  [name] URI (upper respiratory infection) 09-20-12    tx. Amoxicillin-is improved     Past Surgical History  Procedure Date  . Knee surgery 2013    Left - torn ligaments  . Shoulder surgery 2013    Right - biceps tear  . Clavicle surgery 1968    Left  . Colonoscopy 2011    1 polyp sigmoid (hyperplastic), diverticulosis, int hemorrhoids [name])  . Cardiovascular stress test 11/2010    Normal lexiscan nuclear study with inferior thinning but no ischemia     (Not in a hospital admission) Allergies  Allergen Reactions  . Benadryl (Diphenhydramine Hcl) Shortness Of Breath  . Celebrex (Celecoxib) Other (See Comments)    Extreme drowsiness  . Codeine Other (See Comments)    Extreme fatigue; dizziness  . Hydrocodone Other (See Comments)    Drunk feeling    History  Substance Use Topics  . Smoking status: Former Smoker    Types: Cigars    Quit date: 10/11/1990  . Smokeless tobacco: Never Used     Comment: usually on chewed on cigars  . Alcohol Use: No    Family History  Problem Relation Age of Onset  . Coronary artery disease Mother 65    3vCABG  . Arthritis Mother   . Cancer Father     lung, smoker  . Cancer Sister     twin sister - breast  . Stroke Neg Hx   . Diabetes Neg Hx  Review of Systems  Constitutional: Negative.   HENT: Positive for congestion.        Upper resp infection on augmenton  Eyes: Negative.   Respiratory: Positive for cough.   Cardiovascular: Negative.   Gastrointestinal: Negative.   Genitourinary: Negative.   Musculoskeletal: Positive for joint pain.       Left knee with weightbearing  Skin: Negative.   Neurological: Negative.   Endo/Heme/Allergies: Negative.   Psychiatric/Behavioral: Negative.     Objective:  Physical Exam patient's conscious alert appropriate well-developed heavyset side walks with a significant limp. Head is normocephalic pupils equal  gross and intact neck is supple no palpable lymphadenopathy he does have some upper sinus congestion with a runny nose. Sounds were clear throughout heart was regular rate and rhythm abdomen was round soft nontender bowel sounds present upper extremities had good range of motion with good motor strength in his left knee in a slight varus deformity he was painful on the medial aspect he was able to extend his knee to about 5 short of full extension flexes back to about 115 with discomfort no ligament instability Is soft nontender leg is neuromotor vascularly intact. His right lower extremity was unremarkable for any usual findings on physical exam.  Vital signs in last 24 hours: @VSRANGES @  Labs:   Estimated Body mass index is 38.87 kg/(m^2) as calculated from the following:   Height as of 09/20/12: 5\' 7" (1.702 m).   Weight as of 09/20/12: 248 lb 3 oz(112.577 kg).   Imaging Review Plain radiographs demonstrate severe degenerative joint disease of the left knee(s). The overall alignment ismild varus. The bone quality appears to be good for age and reported activity level.  Assessment/Plan:  End stage arthritis, left knee   The patient history, physical examination, clinical judgment of the provider and imaging studies are consistent with end stage degenerative joint disease of the left knee(s) and total knee arthroplasty is deemed medically necessary. The treatment options including medical management, injection therapy arthroscopy and arthroplasty were discussed at length. The risks and benefits of total knee arthroplasty were presented and reviewed. The risks due to aseptic loosening, infection, stiffness, patella tracking problems, thromboembolic complications and other imponderables were discussed. The patient acknowledged the explanation, agreed to proceed with the plan and consent was signed. Patient is being admitted for inpatient treatment for surgery, pain control, PT, OT, prophylactic  antibiotics, VTE prophylaxis, progressive ambulation and ADL's and discharge planning. The patient is planning to be discharged home with home health services

## 2012-09-25 MED ORDER — DEXTROSE 5 % IV SOLN
3.0000 g | INTRAVENOUS | Status: AC
  Administered 2012-09-26: 2 g via INTRAVENOUS
  Filled 2012-09-25: qty 3000

## 2012-09-26 ENCOUNTER — Encounter (HOSPITAL_COMMUNITY): Payer: Self-pay | Admitting: Anesthesiology

## 2012-09-26 ENCOUNTER — Encounter (HOSPITAL_COMMUNITY)
Admission: RE | Disposition: A | Discharge status: Home / Self Care | Payer: Self-pay | Source: Ambulatory Visit | Attending: Specialist

## 2012-09-26 ENCOUNTER — Inpatient Hospital Stay (HOSPITAL_COMMUNITY): Payer: Worker's Compensation | Admitting: Anesthesiology

## 2012-09-26 ENCOUNTER — Encounter (HOSPITAL_COMMUNITY): Payer: Self-pay | Admitting: *Deleted

## 2012-09-26 ENCOUNTER — Inpatient Hospital Stay (HOSPITAL_COMMUNITY)
Admission: RE | Admit: 2012-09-26 | Discharge: 2012-09-29 | DRG: 470 | Disposition: A | Discharge status: Home / Self Care | Payer: Worker's Compensation | Source: Ambulatory Visit | Attending: Specialist | Admitting: Specialist

## 2012-09-26 DIAGNOSIS — R5082 Postprocedural fever: Secondary | ICD-10-CM | POA: Diagnosis not present

## 2012-09-26 DIAGNOSIS — J069 Acute upper respiratory infection, unspecified: Secondary | ICD-10-CM | POA: Diagnosis present

## 2012-09-26 DIAGNOSIS — Z885 Allergy status to narcotic agent status: Secondary | ICD-10-CM

## 2012-09-26 DIAGNOSIS — Z01818 Encounter for other preprocedural examination: Secondary | ICD-10-CM

## 2012-09-26 DIAGNOSIS — J019 Acute sinusitis, unspecified: Secondary | ICD-10-CM

## 2012-09-26 DIAGNOSIS — R079 Chest pain, unspecified: Secondary | ICD-10-CM

## 2012-09-26 DIAGNOSIS — J329 Chronic sinusitis, unspecified: Secondary | ICD-10-CM | POA: Diagnosis present

## 2012-09-26 DIAGNOSIS — I10 Essential (primary) hypertension: Secondary | ICD-10-CM

## 2012-09-26 DIAGNOSIS — Z87891 Personal history of nicotine dependence: Secondary | ICD-10-CM

## 2012-09-26 DIAGNOSIS — E785 Hyperlipidemia, unspecified: Secondary | ICD-10-CM

## 2012-09-26 DIAGNOSIS — M1710 Unilateral primary osteoarthritis, unspecified knee: Principal | ICD-10-CM | POA: Diagnosis present

## 2012-09-26 DIAGNOSIS — C61 Malignant neoplasm of prostate: Secondary | ICD-10-CM

## 2012-09-26 DIAGNOSIS — N138 Other obstructive and reflux uropathy: Secondary | ICD-10-CM

## 2012-09-26 DIAGNOSIS — Z6838 Body mass index (BMI) 38.0-38.9, adult: Secondary | ICD-10-CM

## 2012-09-26 DIAGNOSIS — N289 Disorder of kidney and ureter, unspecified: Secondary | ICD-10-CM

## 2012-09-26 DIAGNOSIS — R112 Nausea with vomiting, unspecified: Secondary | ICD-10-CM | POA: Diagnosis not present

## 2012-09-26 DIAGNOSIS — N401 Enlarged prostate with lower urinary tract symptoms: Secondary | ICD-10-CM

## 2012-09-26 DIAGNOSIS — Z888 Allergy status to other drugs, medicaments and biological substances status: Secondary | ICD-10-CM

## 2012-09-26 DIAGNOSIS — R7303 Prediabetes: Secondary | ICD-10-CM

## 2012-09-26 DIAGNOSIS — E669 Obesity, unspecified: Secondary | ICD-10-CM

## 2012-09-26 DIAGNOSIS — K219 Gastro-esophageal reflux disease without esophagitis: Secondary | ICD-10-CM

## 2012-09-26 DIAGNOSIS — I2510 Atherosclerotic heart disease of native coronary artery without angina pectoris: Secondary | ICD-10-CM | POA: Diagnosis present

## 2012-09-26 HISTORY — PX: TOTAL KNEE ARTHROPLASTY: SHX125

## 2012-09-26 LAB — TYPE AND SCREEN
ABO/RH(D): O POS
Antibody Screen: NEGATIVE

## 2012-09-26 SURGERY — ARTHROPLASTY, KNEE, TOTAL
Anesthesia: Spinal | Site: Knee | Laterality: Left | Wound class: Clean

## 2012-09-26 MED ORDER — PHENOL 1.4 % MT LIQD
1.0000 | OROMUCOSAL | Status: DC | PRN
  Filled 2012-09-26: qty 177

## 2012-09-26 MED ORDER — CEFAZOLIN SODIUM-DEXTROSE 2-3 GM-% IV SOLR
2.0000 g | Freq: Four times a day (QID) | INTRAVENOUS | Status: AC
  Administered 2012-09-26 – 2012-09-27 (×2): 2 g via INTRAVENOUS
  Filled 2012-09-26 (×2): qty 50

## 2012-09-26 MED ORDER — ONDANSETRON HCL 4 MG/2ML IJ SOLN
4.0000 mg | Freq: Four times a day (QID) | INTRAMUSCULAR | Status: DC | PRN
  Administered 2012-09-26 – 2012-09-27 (×2): 4 mg via INTRAVENOUS
  Filled 2012-09-26 (×2): qty 2

## 2012-09-26 MED ORDER — MIDAZOLAM HCL 5 MG/5ML IJ SOLN
INTRAMUSCULAR | Status: DC | PRN
  Administered 2012-09-26: 2 mg via INTRAVENOUS

## 2012-09-26 MED ORDER — ACETAMINOPHEN 325 MG PO TABS
650.0000 mg | ORAL_TABLET | Freq: Four times a day (QID) | ORAL | Status: DC | PRN
  Administered 2012-09-27 – 2012-09-29 (×3): 650 mg via ORAL
  Filled 2012-09-26 (×3): qty 2

## 2012-09-26 MED ORDER — KETOROLAC TROMETHAMINE 30 MG/ML IJ SOLN
INTRAMUSCULAR | Status: AC
  Filled 2012-09-26: qty 1

## 2012-09-26 MED ORDER — POVIDONE-IODINE 7.5 % EX SOLN: Freq: Once | CUTANEOUS | Status: DC

## 2012-09-26 MED ORDER — FLEET ENEMA 7-19 GM/118ML RE ENEM: 1.0000 | ENEMA | Freq: Once | RECTAL | Status: AC | PRN

## 2012-09-26 MED ORDER — OXYCODONE HCL 5 MG PO TABS
5.0000 mg | ORAL_TABLET | ORAL | Status: DC | PRN
  Administered 2012-09-26 – 2012-09-28 (×10): 10 mg via ORAL
  Administered 2012-09-28: 5 mg via ORAL
  Administered 2012-09-28: 10 mg via ORAL
  Administered 2012-09-29 (×3): 5 mg via ORAL
  Filled 2012-09-26 (×3): qty 2
  Filled 2012-09-26: qty 1
  Filled 2012-09-26 (×6): qty 2
  Filled 2012-09-26 (×2): qty 1
  Filled 2012-09-26: qty 2
  Filled 2012-09-26: qty 1
  Filled 2012-09-26: qty 2

## 2012-09-26 MED ORDER — GUAIFENESIN 100 MG/5ML PO SOLN: 200.0000 mg | Freq: Three times a day (TID) | ORAL | Status: DC | PRN

## 2012-09-26 MED ORDER — ONDANSETRON HCL 4 MG PO TABS: 4.0000 mg | ORAL_TABLET | Freq: Four times a day (QID) | ORAL | Status: DC | PRN

## 2012-09-26 MED ORDER — MENTHOL 3 MG MT LOZG
1.0000 | LOZENGE | OROMUCOSAL | Status: DC | PRN
  Filled 2012-09-26: qty 9

## 2012-09-26 MED ORDER — HYDROMORPHONE HCL PF 1 MG/ML IJ SOLN
0.2500 mg | INTRAMUSCULAR | Status: DC | PRN
  Administered 2012-09-26 (×2): 0.5 mg via INTRAVENOUS
  Filled 2012-09-26 (×2): qty 1

## 2012-09-26 MED ORDER — PROPOFOL INFUSION 10 MG/ML OPTIME
INTRAVENOUS | Status: DC | PRN
  Administered 2012-09-26: 200 ug/kg/min via INTRAVENOUS

## 2012-09-26 MED ORDER — LACTATED RINGERS IV SOLN
INTRAVENOUS | Status: DC
  Administered 2012-09-26: 1000 mL via INTRAVENOUS

## 2012-09-26 MED ORDER — EPHEDRINE SULFATE 50 MG/ML IJ SOLN
INTRAMUSCULAR | Status: DC | PRN
  Administered 2012-09-26: 7.5 mg via INTRAVENOUS
  Administered 2012-09-26 (×2): 5 mg via INTRAVENOUS

## 2012-09-26 MED ORDER — BUPIVACAINE IN DEXTROSE 0.75-8.25 % IT SOLN
INTRATHECAL | Status: DC | PRN
  Administered 2012-09-26: 2 mL via INTRATHECAL

## 2012-09-26 MED ORDER — PROMETHAZINE HCL 25 MG/ML IJ SOLN: 6.2500 mg | INTRAMUSCULAR | Status: DC | PRN

## 2012-09-26 MED ORDER — DOCUSATE SODIUM 100 MG PO CAPS
100.0000 mg | ORAL_CAPSULE | Freq: Two times a day (BID) | ORAL | Status: DC
  Administered 2012-09-26 – 2012-09-29 (×6): 100 mg via ORAL
  Filled 2012-09-26: qty 1

## 2012-09-26 MED ORDER — HYDROMORPHONE HCL PF 1 MG/ML IJ SOLN: 0.2500 mg | INTRAMUSCULAR | Status: DC | PRN

## 2012-09-26 MED ORDER — SODIUM CHLORIDE 0.9 % IR SOLN
Status: DC | PRN
  Administered 2012-09-26: 3000 mL

## 2012-09-26 MED ORDER — BISACODYL 10 MG RE SUPP: 10.0000 mg | Freq: Every day | RECTAL | Status: DC | PRN

## 2012-09-26 MED ORDER — FERROUS SULFATE 325 (65 FE) MG PO TABS
325.0000 mg | ORAL_TABLET | Freq: Three times a day (TID) | ORAL | Status: DC
  Administered 2012-09-26 – 2012-09-29 (×9): 325 mg via ORAL
  Filled 2012-09-26 (×11): qty 1

## 2012-09-26 MED ORDER — KETOROLAC TROMETHAMINE 30 MG/ML IJ SOLN
INTRAMUSCULAR | Status: DC | PRN
  Administered 2012-09-26: 30 mg via INTRAMUSCULAR

## 2012-09-26 MED ORDER — ACETAMINOPHEN 10 MG/ML IV SOLN
1000.0000 mg | Freq: Four times a day (QID) | INTRAVENOUS | Status: AC
  Administered 2012-09-26 – 2012-09-27 (×4): 1000 mg via INTRAVENOUS
  Filled 2012-09-26 (×6): qty 100

## 2012-09-26 MED ORDER — ZOLPIDEM TARTRATE 5 MG PO TABS: 5.0000 mg | ORAL_TABLET | Freq: Every evening | ORAL | Status: DC | PRN

## 2012-09-26 MED ORDER — MEPERIDINE HCL 50 MG/ML IJ SOLN: 6.2500 mg | INTRAMUSCULAR | Status: DC | PRN

## 2012-09-26 MED ORDER — ENOXAPARIN SODIUM 30 MG/0.3ML ~~LOC~~ SOLN
30.0000 mg | Freq: Two times a day (BID) | SUBCUTANEOUS | Status: DC
  Administered 2012-09-27 – 2012-09-29 (×5): 30 mg via SUBCUTANEOUS
  Filled 2012-09-26 (×7): qty 0.3

## 2012-09-26 MED ORDER — BUPIVACAINE-EPINEPHRINE 0.25% -1:200000 IJ SOLN
INTRAMUSCULAR | Status: AC
  Filled 2012-09-26: qty 1

## 2012-09-26 MED ORDER — POLYETHYLENE GLYCOL 3350 17 G PO PACK: 17.0000 g | PACK | Freq: Every day | ORAL | Status: DC | PRN

## 2012-09-26 MED ORDER — LACTATED RINGERS IV SOLN
INTRAVENOUS | Status: DC | PRN
  Administered 2012-09-26 (×4): via INTRAVENOUS

## 2012-09-26 MED ORDER — SODIUM CHLORIDE 0.9 % IV SOLN: INTRAVENOUS | Status: DC

## 2012-09-26 MED ORDER — PROPOFOL 10 MG/ML IV BOLUS
INTRAVENOUS | Status: DC | PRN
  Administered 2012-09-26: 30 mg via INTRAVENOUS

## 2012-09-26 MED ORDER — ACETAMINOPHEN 650 MG RE SUPP: 650.0000 mg | Freq: Four times a day (QID) | RECTAL | Status: DC | PRN

## 2012-09-26 MED ORDER — BUPIVACAINE-EPINEPHRINE PF 0.25-1:200000 % IJ SOLN
INTRAMUSCULAR | Status: DC | PRN
  Administered 2012-09-26: 50 mL

## 2012-09-26 MED ORDER — METHOCARBAMOL 100 MG/ML IJ SOLN
500.0000 mg | Freq: Four times a day (QID) | INTRAVENOUS | Status: DC | PRN
  Administered 2012-09-26: 500 mg via INTRAVENOUS
  Filled 2012-09-26 (×2): qty 5

## 2012-09-26 MED ORDER — LACTATED RINGERS IV SOLN: INTRAVENOUS | Status: DC

## 2012-09-26 MED ORDER — ALUM & MAG HYDROXIDE-SIMETH 200-200-20 MG/5ML PO SUSP
30.0000 mL | ORAL | Status: DC | PRN
  Administered 2012-09-26 – 2012-09-27 (×2): 30 mL via ORAL
  Filled 2012-09-26 (×2): qty 30

## 2012-09-26 MED ORDER — ACETAMINOPHEN 10 MG/ML IV SOLN: 1000.0000 mg | Freq: Once | INTRAVENOUS | Status: DC | PRN

## 2012-09-26 MED ORDER — POTASSIUM CHLORIDE IN NACL 20-0.9 MEQ/L-% IV SOLN
INTRAVENOUS | Status: DC
  Administered 2012-09-26 – 2012-09-27 (×2): via INTRAVENOUS
  Administered 2012-09-27: 1000 mL via INTRAVENOUS
  Filled 2012-09-26 (×3): qty 1000

## 2012-09-26 MED ORDER — ACETAMINOPHEN 10 MG/ML IV SOLN
INTRAVENOUS | Status: DC | PRN
  Administered 2012-09-26: 1000 mg via INTRAVENOUS

## 2012-09-26 MED ORDER — METHOCARBAMOL 500 MG PO TABS
500.0000 mg | ORAL_TABLET | Freq: Four times a day (QID) | ORAL | Status: DC | PRN
  Administered 2012-09-27 – 2012-09-28 (×4): 500 mg via ORAL
  Filled 2012-09-26 (×4): qty 1

## 2012-09-26 MED ORDER — FENTANYL CITRATE 0.05 MG/ML IJ SOLN
INTRAMUSCULAR | Status: DC | PRN
  Administered 2012-09-26: 50 ug via INTRAVENOUS

## 2012-09-26 SURGICAL SUPPLY — 66 items
BAG SPEC THK2 15X12 ZIP CLS (MISCELLANEOUS) ×2
BAG ZIPLOCK 12X15 (MISCELLANEOUS) ×4 IMPLANT
BANDAGE ELASTIC 4 VELCRO ST LF (GAUZE/BANDAGES/DRESSINGS) ×2 IMPLANT
BANDAGE ELASTIC 6 VELCRO ST LF (GAUZE/BANDAGES/DRESSINGS) ×2 IMPLANT
BANDAGE ESMARK 6X9 LF (GAUZE/BANDAGES/DRESSINGS) ×1 IMPLANT
BANDAGE GAUZE ELAST BULKY 4 IN (GAUZE/BANDAGES/DRESSINGS) ×3 IMPLANT
BLADE SAG 18X100X1.27 (BLADE) ×2 IMPLANT
BLADE SAW SGTL 13.0X1.19X90.0M (BLADE) ×2 IMPLANT
BNDG CMPR 9X6 STRL LF SNTH (GAUZE/BANDAGES/DRESSINGS) ×1
BNDG ESMARK 6X9 LF (GAUZE/BANDAGES/DRESSINGS) ×2
CEMENT HV SMART SET (Cement) ×4 IMPLANT
CLOSURE STERI-STRIP 1/4X4 (GAUZE/BANDAGES/DRESSINGS) ×1 IMPLANT
CLOTH BEACON ORANGE TIMEOUT ST (SAFETY) ×2 IMPLANT
COVER SURGICAL LIGHT HANDLE (MISCELLANEOUS) ×2 IMPLANT
CUFF TOURN SGL QUICK 34 (TOURNIQUET CUFF) ×2
CUFF TRNQT CYL 34X4X40X1 (TOURNIQUET CUFF) ×1 IMPLANT
DRAPE EXTREMITY T 121X128X90 (DRAPE) ×2 IMPLANT
DRAPE LG THREE QUARTER DISP (DRAPES) ×2 IMPLANT
DRAPE POUCH INSTRU U-SHP 10X18 (DRAPES) ×2 IMPLANT
DRAPE U-SHAPE 47X51 STRL (DRAPES) ×2 IMPLANT
DRSG PAD ABDOMINAL 8X10 ST (GAUZE/BANDAGES/DRESSINGS) ×4 IMPLANT
DURAPREP 26ML APPLICATOR (WOUND CARE) ×2 IMPLANT
ELECT REM PT RETURN 9FT ADLT (ELECTROSURGICAL) ×2
ELECTRODE REM PT RTRN 9FT ADLT (ELECTROSURGICAL) ×1 IMPLANT
EVACUATOR 1/8 PVC DRAIN (DRAIN) ×2 IMPLANT
FACESHIELD LNG OPTICON STERILE (SAFETY) ×10 IMPLANT
GAUZE XEROFORM 2X2 STRL (GAUZE/BANDAGES/DRESSINGS) ×1 IMPLANT
GLOVE ECLIPSE 8.0 STRL XLNG CF (GLOVE) ×2 IMPLANT
GLOVE SURG ORTHO 8.0 STRL STRW (GLOVE) ×2 IMPLANT
GLOVE SURG ORTHO 9.0 STRL STRW (GLOVE) ×2 IMPLANT
GOWN PREVENTION PLUS XLARGE (GOWN DISPOSABLE) ×6 IMPLANT
GOWN STRL NON-REIN LRG LVL3 (GOWN DISPOSABLE) ×2 IMPLANT
GOWN STRL REIN XL XLG (GOWN DISPOSABLE) ×2 IMPLANT
HANDPIECE INTERPULSE COAX TIP (DISPOSABLE) ×2
IMMOBILIZER KNEE 20 (SOFTGOODS)
IMMOBILIZER KNEE 20 THIGH 36 (SOFTGOODS) IMPLANT
KIT BASIN OR (CUSTOM PROCEDURE TRAY) ×2 IMPLANT
NDL SAFETY ECLIPSE 18X1.5 (NEEDLE) ×1 IMPLANT
NEEDLE HYPO 18GX1.5 SHARP (NEEDLE) ×2
NS IRRIG 1000ML POUR BTL (IV SOLUTION) ×2 IMPLANT
PACK TOTAL JOINT (CUSTOM PROCEDURE TRAY) ×2 IMPLANT
PAD ABD 7.5X8 STRL (GAUZE/BANDAGES/DRESSINGS) ×1 IMPLANT
POSITIONER SURGICAL ARM (MISCELLANEOUS) ×2 IMPLANT
SET HNDPC FAN SPRY TIP SCT (DISPOSABLE) ×1 IMPLANT
SET PAD KNEE POSITIONER (MISCELLANEOUS) ×2 IMPLANT
SPONGE GAUZE 4X4 12PLY (GAUZE/BANDAGES/DRESSINGS) ×2 IMPLANT
SPONGE LAP 18X18 X RAY DECT (DISPOSABLE) IMPLANT
SPONGE SURGIFOAM ABS GEL 100 (HEMOSTASIS) ×2 IMPLANT
STOCKINETTE 6  STRL (DRAPES) ×1
STOCKINETTE 6 STRL (DRAPES) ×1 IMPLANT
STRIP CLOSURE SKIN 1/2X4 (GAUZE/BANDAGES/DRESSINGS) ×4 IMPLANT
SUCTION FRAZIER 12FR DISP (SUCTIONS) ×2 IMPLANT
SUT BONE WAX W31G (SUTURE) ×2 IMPLANT
SUT MNCRL AB 3-0 PS2 18 (SUTURE) ×2 IMPLANT
SUT VIC AB 1 CT1 27 (SUTURE) ×14
SUT VIC AB 1 CT1 27XBRD ANTBC (SUTURE) ×7 IMPLANT
SUT VIC AB 2-0 CT1 27 (SUTURE) ×4
SUT VIC AB 2-0 CT1 TAPERPNT 27 (SUTURE) ×2 IMPLANT
SUT VLOC 180 0 24IN GS25 (SUTURE) ×2 IMPLANT
SYR 50ML LL SCALE MARK (SYRINGE) ×2 IMPLANT
TAPE STRIPS DRAPE STRL (GAUZE/BANDAGES/DRESSINGS) ×2 IMPLANT
TOWEL OR 17X26 10 PK STRL BLUE (TOWEL DISPOSABLE) ×6 IMPLANT
TOWER CARTRIDGE SMART MIX (DISPOSABLE) ×2 IMPLANT
TRAY FOLEY CATH 14FRSI W/METER (CATHETERS) ×2 IMPLANT
WATER STERILE IRR 1500ML POUR (IV SOLUTION) ×2 IMPLANT
WRAP KNEE MAXI GEL POST OP (GAUZE/BANDAGES/DRESSINGS) ×2 IMPLANT

## 2012-09-26 NOTE — Anesthesia Preprocedure Evaluation (Addendum)
Anesthesia Evaluation  Patient identified by MRN, date of birth, ID band Patient awake    Reviewed: Allergy & Precautions, H&P , NPO status , Patient's Chart, lab work & pertinent test results  Airway Mallampati: II TM Distance: >3 FB Neck ROM: Full    Dental  (+) Dental Advisory Given, Edentulous Upper and Edentulous Lower   Pulmonary neg pulmonary ROS,  breath sounds clear to auscultation  Pulmonary exam normal       Cardiovascular hypertension, Pt. on medications + CAD Rhythm:Regular Rate:Normal     Neuro/Psych negative neurological ROS  negative psych ROS   GI/Hepatic Neg liver ROS, GERD-  Medicated,  Endo/Other  Morbid obesity  Renal/GU negative Renal ROS     Musculoskeletal negative musculoskeletal ROS (+)   Abdominal   Peds  Hematology negative hematology ROS (+)   Anesthesia Other Findings   Reproductive/Obstetrics                         Anesthesia Physical Anesthesia Plan  ASA: III  Anesthesia Plan: Spinal   Post-op Pain Management:    Induction:   Airway Management Planned:   Additional Equipment:   Intra-op Plan:   Post-operative Plan:   Informed Consent: I have reviewed the patients History and Physical, chart, labs and discussed the procedure including the risks, benefits and alternatives for the proposed anesthesia with the patient or authorized representative who has indicated his/her understanding and acceptance.   Dental advisory given  Plan Discussed with: CRNA  Anesthesia Plan Comments:        Anesthesia Quick Evaluation

## 2012-09-26 NOTE — Anesthesia Postprocedure Evaluation (Signed)
Anesthesia Post Note  Patient: Kevin Campbell  Procedure(s) Performed: Procedure(s) (LRB): TOTAL KNEE ARTHROPLASTY (Left)  Anesthesia type: Spinal  Patient location: PACU  Post pain: Pain level controlled  Post assessment: Post-op Vital signs reviewed  Last Vitals: BP 126/73  Pulse 77  Temp 36.6 C (Oral)  Resp 14  Ht 5\' 7"  (1.702 m)  Wt 248 lb (112.492 kg)  BMI 38.84 kg/m2  SpO2 96%  Post vital signs: Reviewed  Level of consciousness: sedated  Complications: No apparent anesthesia complications

## 2012-09-26 NOTE — Anesthesia Procedure Notes (Signed)
Spinal  Patient location during procedure: OR Start time: 09/26/2012 12:41 PM End time: 09/26/2012 12:44 PM Staffing Anesthesiologist: Lewie Loron R Performed by: anesthesiologist  Preanesthetic Checklist Completed: patient identified, site marked, surgical consent, pre-op evaluation, timeout performed, IV checked, risks and benefits discussed and monitors and equipment checked Spinal Block Patient position: sitting Prep: ChloraPrep Patient monitoring: heart rate, continuous pulse ox and blood pressure Approach: midline Location: L4-5 Injection technique: single-shot Needle Needle type: Quincke  Needle gauge: 22 G Needle length: 9 cm Assessment Sensory level: T8 Additional Notes Expiration date of kit checked and confirmed. Patient tolerated procedure well, without complications.

## 2012-09-26 NOTE — Op Note (Signed)
DATE OF SURGERY:  09/26/2012  TIME: 2:27 PM  PATIENT NAME:  Kevin Campbell    AGE: 66 y.o.   PRE-OPERATIVE DIAGNOSIS:  osteoarthritits of the Left Knee  POST-OPERATIVE DIAGNOSIS:  osteoarthritits of the Left Knee  PROCEDURE:  Procedure(s): TOTAL KNEE ARTHROPLASTY  SURGEON:  Porshea Janowski ANDREW  ASSISTANT:  Oneida Alar, PA-C, present and scrubbed throughout the case, critical for assistance with exposure, retraction, instrumentation, and closure.  OPERATIVE IMPLANTS: Depuy PFC Sigma Rotating Platform.  Femur size 4, Tibia size 4, Patella size 38 3-peg oval button, with a 12.5 mm polyethylene insert.   PREOPERATIVE INDICATIONS:   Kevin Campbell is a 66 y.o. year old male with end stage bone on bone arthritis of the knee who failed conservative treatment and elected for Total Knee Arthroplasty.   The risks, benefits, and alternatives were discussed at length including but not limited to the risks of infection, bleeding, nerve injury, stiffness, blood clots, the need for revision surgery, cardiopulmonary complications, among others, and they were willing to proceed.  OPERATIVE DESCRIPTION:  The patient was brought to the operative room and placed in a supine position.  Spinal anesthesia was administered.  IV antibiotics were given.  The lower extremity was prepped and draped in the usual sterile fashion.  Time out was performed.  The leg was elevated and exsanguinated and the tourniquet was inflated.  Anterior quadriceps tendon splitting approach was performed.  The patella was retracted and osteophytes were removed.  The anterior horn of the medial and lateral meniscus was removed and cruciate ligaments resected.   The distal femur was opened with the drill and the intramedullary distal femoral cutting jig was utilized, set at 5 degrees resecting 10 mm off the distal femur.  Care was taken to protect the collateral ligaments.  The distal femoral sizing jig was applied, taking  care to avoid notching.  Then the 4-in-1 cutting jig was applied and the anterior and posterior femur was cut, along with the chamfer cuts.    Then the extramedullary tibial cutting jig was utilized making the appropriate cut using the anterior tibial crest as a reference building in appropriate posterior slope.  Care was taken during the cut to protect the medial and collateral ligaments.  The proximal tibia was removed along with the posterior horns of the menisci.   The posterior medial femoral osteophytes and posterior lateral femoral osteophytes were removed.    The flexion gap was then measured and was symmetric with the extension gap, measured at 12.  I completed the distal femoral preparation using the appropriate jig to prepare the box.  The patella was then measured, and cut with the saw.    The proximal tibia sized and prepared accordingly with the reamer and the punch, and then all components were trialed with the trial insert.  The knee was found to have excellent balance and full motion.    The above named components were then cemented into place and all excess cement was removed.  The trial polyethylene component was in place during cementation, and then was exchanged for the real polyethylene component.    The knee was easily taken through a range of motion and the patella tracked well and the knee irrigated copiously and the parapatellar and subcutaneous tissue closed with vicryl, and monocryl with steri strips for the skin.  The arthrotomy was closed at 90 of flexion. The wounds were dressed with sterile gauze and the tourniquet released and the patient was awakened and returned  to the PACU in stable and satisfactory condition.  There were no complications.  Total tourniquet time was 90 minutes.perosteal injection of 60cc 0.25% marcaine and 30 mg toradol. Capsule closed with Vlock.

## 2012-09-26 NOTE — H&P (Signed)
TOTAL KNEE ADMISSION H&P  Patient is being admitted for left total knee arthroplasty.  Subjective:  Chief Complaint:left knee pain.  HPI: Kevin Campbell, 66 y.o. male, has a history of pain and functional disability in the left knee due to trauma and arthritis and has failed non-surgical conservative treatments for greater than 12 weeks to includeNSAID's and/or analgesics, corticosteriod injections, viscosupplementation injections, flexibility and strengthening excercises, supervised PT with diminished ADL's post treatment and activity modification.  Onset of symptoms was abrupt, starting 1 years ago with rapidlly worsening course since that time. The patient noted prior procedures on the knee to include  arthroscopy on the left knee(s).  Patient currently rates pain in the left knee(s) at 9 out of 10 with activity. Patient has night pain, worsening of pain with activity and weight bearing and pain that interferes with activities of daily living.  Patient has evidence of loose of joint space,sclerotic changes,osteophytes by imaging studies. This patient has had knee scope. There is no active infection.  Patient Active Problem List   Diagnosis Date Noted  . Acute sinusitis 09/04/2012  . Preoperative clearance 07/11/2012  . Primary prostate adenocarcinoma 03/21/2012  . Prediabetes   . DYSLIPIDEMIA 12/09/2010  . OBESITY 12/09/2010  . HYPERTENSION 12/09/2010  . GERD 12/09/2010  . KIDNEY DISEASE 12/09/2010  . BENIGN PROSTATIC HYPERTROPHY, WITH OBSTRUCTION 12/09/2010  . CHEST PAIN 12/09/2010   Past Medical History  Diagnosis Date  . Arthritis   . Seasonal allergies   . Colon polyps 2011    [name]  . BPH (benign prostatic hypertrophy)   . History of chicken pox   . GERD (gastroesophageal reflux disease)   . CAD (coronary artery disease) 2007    cath, nonobstructive  . Obesity   . Prediabetes   . Adenocarcinoma of prostate     gleason 4+3 = 7 in 2 cores, 3+4 = 7 in 2 cores, 3+3 = 6 in  1 core all from L lobe ([place]).Pt. to have tx. after knee surgery.  [name] URI (upper respiratory infection) 09-20-12    tx. Amoxicillin-is improved     Past Surgical History  Procedure Date  . Knee surgery 2013    Left - torn ligaments  . Shoulder surgery 2013    Right - biceps tear  . Clavicle surgery 1968    Left  . Colonoscopy 2011    1 polyp sigmoid (hyperplastic), diverticulosis, int hemorrhoids [name] at [facility])  . Cardiovascular stress test 11/2010    Normal lexiscan nuclear study with inferior thinning but no ischemia    Prescriptions prior to admission  Medication Sig Dispense Refill  . amoxicillin-clavulanate (AUGMENTIN) 875-125 MG per tablet Take 1 tablet by mouth 2 (two) times daily.  20 tablet  0  . guaiFENesin (ROBITUSSIN) 100 MG/5ML liquid Take 200 mg by mouth 3 (three) times daily as needed.       Allergies  Allergen Reactions  . Benadryl (Diphenhydramine Hcl) Shortness Of Breath  . Celebrex (Celecoxib) Other (See Comments)    Extreme drowsiness  . Codeine Other (See Comments)    Extreme fatigue; dizziness  . Hydrocodone Other (See Comments)    Drunk feeling    History  Substance Use Topics  . Smoking status: Former Smoker    Types: Cigars    Quit date: 10/11/1990  . Smokeless tobacco: Never Used     Comment: usually on chewed on cigars  . Alcohol Use: No    Family History  Problem Relation Age of Onset  .  Coronary artery disease Mother 10    3vCABG  . Arthritis Mother   . Cancer Father     lung, smoker  . Cancer Sister     twin sister - breast  . Stroke Neg Hx   . Diabetes Neg Hx      Review of Systems  Constitutional: Negative.   HENT: Positive for congestion.        Upper resp infection on augmenton  Eyes: Negative.   Respiratory: Positive for cough.   Cardiovascular: Negative.   Gastrointestinal: Negative.   Genitourinary: Negative.   Musculoskeletal: Positive for joint pain.       Left knee with weightbearing  Skin: Negative.    Neurological: Negative.   Endo/Heme/Allergies: Negative.   Psychiatric/Behavioral: Negative.     Objective:  Physical Exam patient's conscious alert appropriate well-developed heavyset side walks with a significant limp. Head is normocephalic pupils equal gross and intact neck is supple no palpable lymphadenopathy he does have some upper sinus congestion with a runny nose. Sounds were clear throughout heart was regular rate and rhythm abdomen was round soft nontender bowel sounds present upper extremities had good range of motion with good motor strength in his left knee in a slight varus deformity he was painful on the medial aspect he was able to extend his knee to about 5 short of full extension flexes back to about 115 with discomfort no ligament instability Is soft nontender leg is neuromotor vascularly intact. His right lower extremity was unremarkable for any usual findings on physical exam.  Vital signs in last 24 hours: @VSRANGES @  Labs:   Estimated Body mass index is 37.98 kg/(m^2) as calculated from the following:   Height as of 01/28/12: 5\' 7" (1.702 m).   Weight as of 07/11/12: 242 lb 8 oz(109.997 kg).   Imaging Review Plain radiographs demonstrate severe degenerative joint disease of the left knee(s). The overall alignment ismild varus. The bone quality appears to be good for age and reported activity level.  Assessment/Plan:  End stage arthritis, left knee   The patient history, physical examination, clinical judgment of the provider and imaging studies are consistent with end stage degenerative joint disease of the left knee(s) and total knee arthroplasty is deemed medically necessary. The treatment options including medical management, injection therapy arthroscopy and arthroplasty were discussed at length. The risks and benefits of total knee arthroplasty were presented and reviewed. The risks due to aseptic loosening, infection, stiffness, patella tracking problems,  thromboembolic complications and other imponderables were discussed. The patient acknowledged the explanation, agreed to proceed with the plan and consent was signed. Patient is being admitted for inpatient treatment for surgery, pain control, PT, OT, prophylactic antibiotics, VTE prophylaxis, progressive ambulation and ADL's and discharge planning. The patient is planning to be discharged home with home health services  I have seen and examined this patient.  Agree with the note above.  Ikram Riebe ANDREW 09/26/2012 12:20 PM

## 2012-09-26 NOTE — Transfer of Care (Signed)
Immediate Anesthesia Transfer of Care Note  Patient: Kevin Campbell  Procedure(s) Performed: Procedure(s) (LRB) with comments: TOTAL KNEE ARTHROPLASTY (Left)  Patient Location: PACU  Anesthesia Type:Regional and Spinal  Level of Consciousness: awake, alert , sedated and patient cooperative  Airway & Oxygen Therapy: Patient Spontanous Breathing and Patient connected to face mask oxygen  Post-op Assessment: Report given to PACU RN and Post -op Vital signs reviewed and stable  Post vital signs: Reviewed and stable  Complications: No apparent anesthesia complications

## 2012-09-27 ENCOUNTER — Encounter (HOSPITAL_COMMUNITY): Payer: Self-pay | Admitting: Specialist

## 2012-09-27 LAB — CBC
HCT: 36.6 % — ABNORMAL LOW (ref 39.0–52.0)
Hemoglobin: 12.4 g/dL — ABNORMAL LOW (ref 13.0–17.0)
MCHC: 33.9 g/dL (ref 30.0–36.0)
MCV: 87.1 fL (ref 78.0–100.0)
RDW: 13.3 % (ref 11.5–15.5)

## 2012-09-27 LAB — BASIC METABOLIC PANEL
BUN: 13 mg/dL (ref 6–23)
Chloride: 101 mEq/L (ref 96–112)
Creatinine, Ser: 1.24 mg/dL (ref 0.50–1.35)
GFR calc Af Amer: 69 mL/min — ABNORMAL LOW (ref >90)
GFR calc non Af Amer: 60 mL/min — ABNORMAL LOW (ref >90)
Glucose, Bld: 152 mg/dL — ABNORMAL HIGH (ref 70–99)

## 2012-09-27 MED ORDER — PANTOPRAZOLE SODIUM 20 MG PO TBEC
20.0000 mg | DELAYED_RELEASE_TABLET | Freq: Every day | ORAL | Status: DC
  Administered 2012-09-27 – 2012-09-29 (×3): 20 mg via ORAL
  Filled 2012-09-27 (×3): qty 1

## 2012-09-27 NOTE — Evaluation (Signed)
Occupational Therapy Evaluation Patient Details Name: Kevin Campbell MRN: [id number] DOB: Mar 14, 1946 Today's Date: 09/27/2012 Time: 7829-5621 OT Time Calculation (min): 23 min  OT Assessment / Plan / Recommendation Clinical Impression  Pt presents POD 1 LTKR. Skilled OT recommended to maximize independence with BADLs to supervision level in prep for safe d/c home with prn A from wife.    OT Assessment  Patient needs continued OT Services    Follow Up Recommendations  No OT follow up;Home health OT (depending on progress)    Barriers to Discharge      Equipment Recommendations  3 in 1 bedside comode    Recommendations for Other Services    Frequency  Min 2X/week    Precautions / Restrictions Precautions Precautions: Knee Required Braces or Orthoses: Knee Immobilizer - Left Knee Immobilizer - Left: Discontinue once straight leg raise with < 10 degree lag Restrictions Weight Bearing Restrictions: No Other Position/Activity Restrictions: WBAT   Pertinent Vitals/Pain Pt reported 8/10 pain in L knee. Repositioned and cold applied.    ADL  Grooming: Set up Where Assessed - Grooming: Unsupported sitting Upper Body Bathing: Set up Where Assessed - Upper Body Bathing: Unsupported sitting Lower Body Bathing: Moderate assistance Where Assessed - Lower Body Bathing: Supported sit to stand Upper Body Dressing: Set up Where Assessed - Upper Body Dressing: Unsupported sitting Lower Body Dressing: Maximal assistance Where Assessed - Lower Body Dressing: Supported sit to stand Toilet Transfer: Minimal assistance Toilet Transfer Method: Surveyor, minerals: Other (comment) (to recliner) Toileting - Clothing Manipulation and Hygiene: Minimal assistance Where Assessed - Glass blower/designer Manipulation and Hygiene: Standing Equipment Used: Rolling walker;Gait belt ADL Comments: Eval limited by N/V. Assisted pt to chair.    OT Diagnosis: Generalized weakness  OT  Problem List: Decreased activity tolerance;Decreased safety awareness;Decreased knowledge of use of DME or AE;Pain OT Treatment Interventions: Self-care/ADL training;Therapeutic activities;DME and/or AE instruction;Patient/family education   OT Goals Acute Rehab OT Goals OT Goal Formulation: With patient/family Time For Goal Achievement: 10/04/12 Potential to Achieve Goals: Good ADL Goals Pt Will Perform Grooming: with supervision;Standing at sink ADL Goal: Grooming - Progress: Goal set today Pt Will Perform Lower Body Dressing: with supervision;Sit to stand from bed;Sit to stand from chair ADL Goal: Lower Body Dressing - Progress: Goal set today Pt Will Transfer to Toilet: with supervision;with DME;Ambulation ADL Goal: Toilet Transfer - Progress: Goal set today Pt Will Perform Toileting - Clothing Manipulation: with supervision;Sitting on 3-in-1 or toilet;Standing ADL Goal: Toileting - Clothing Manipulation - Progress: Goal set today Pt Will Perform Toileting - Hygiene: with supervision;Sit to stand from 3-in-1/toilet ADL Goal: Toileting - Hygiene - Progress: Goal set today  Visit Information  Last OT Received On: 09/27/12 Assistance Needed: +2 (safety w/ amb due to nausea) PT/OT Co-Evaluation/Treatment: Yes    Subjective Data  Subjective: I feel like I'm going to throw up.... Patient Stated Goal: Not asked.   Prior Functioning     Home Living Lives With: Spouse Available Help at Discharge: Family;Available 24 hours/day Type of Home: House Home Access: Stairs to enter [employer] of Steps: 1 Home Layout: Two level;Able to live on main level with bedroom/bathroom Alternate Level Stairs-Number of Steps: 1 step down to bathroom Bathroom Shower/Tub: Engineer, manufacturing systems: Standard Home Adaptive Equipment: Walker - rolling;Straight cane Prior Function Level of Independence: Needs assistance Needs Assistance: Meal Prep;Light Housekeeping Meal Prep:  Total Light Housekeeping: Total Driving: Yes Vocation: Retired Musician: No difficulties Dominant Hand: Right  Vision/Perception     Cognition  Overall Cognitive Status: Appears within functional limits for tasks assessed/performed Arousal/Alertness: Awake/alert Orientation Level: Appears intact for tasks assessed Behavior During Session: [facility] for tasks performed    Extremity/Trunk Assessment Right Upper Extremity Assessment RUE ROM/Strength/Tone: [facility] for tasks assessed Right Lower Extremity Assessment RLE ROM/Strength/Tone: WFL for tasks assessed RLE Sensation: WFL - Light Touch RLE Coordination: WFL - gross/fine motor Left Lower Extremity Assessment LLE ROM/Strength/Tone: Deficits LLE ROM/Strength/Tone Deficits: able to perform SLR with some assist from therapist.  Ankle motions WFL.  LLE Sensation: WFL - Light Touch Trunk Assessment Trunk Assessment: Normal     Mobility Bed Mobility Bed Mobility: Supine to Sit;Sitting - Scoot to Edge of Bed Supine to Sit: 4: Min assist Sitting - Scoot to [place] of Bed: 5: Supervision Details for Bed Mobility Assistance: Assist for LLE out of bed with cues for hand placement on bed to self assist into sitting.  Transfers Sit to Stand: 4: Min assist;From elevated surface;With upper extremity assist;From bed Stand to Sit: 4: Min assist;With upper extremity assist;With armrests;To chair/3-in-1 Details for Transfer Assistance: Performed x 2 to allow rest due to nausea.  Assist to rise and steady with cues for hand placement, technique and LE management. Pt able to take several small steps from bed to chair with cues for sequencing/technique with RW.  Deferred further ambulation due to nausea.      Shoulder Instructions     Exercise     Balance     End of Session OT - End of Session Activity Tolerance: Treatment limited secondary to medical complications (Comment) (N&V) Patient left: in chair;with call  bell/phone within reach  GO     Eston Heslin A OTR/L [phone] 09/27/2012, 10:22 AM

## 2012-09-27 NOTE — Progress Notes (Signed)
Physical Therapy Treatment Patient Details Name: Kevin Campbell MRN: [id number] DOB: 05/07/1946 Today's Date: 09/27/2012 Time: 4098-1191 PT Time Calculation (min): 29 min  PT Assessment / Plan / Recommendation Comments on Treatment Session  Pt doing much better this afternoon with no c/o nausea/dizziness.  Able to ambulate in hallway and perform exercises.     Follow Up Recommendations  Home health PT     Does the patient have the potential to tolerate intense rehabilitation     Barriers to Discharge        Equipment Recommendations  None recommended by PT    Recommendations for Other Services    Frequency 7X/week   Plan Discharge plan remains appropriate    Precautions / Restrictions Precautions Precautions: Knee Required Braces or Orthoses: Knee Immobilizer - Left Knee Immobilizer - Left: Discontinue once straight leg raise with < 10 degree lag Restrictions Weight Bearing Restrictions: No Other Position/Activity Restrictions: WBAT   Pertinent Vitals/Pain     Mobility  Bed Mobility Bed Mobility: Not assessed Transfers Transfers: Sit to Stand;Stand to Sit;Stand Pivot Transfers Sit to Stand: 4: Min assist;With upper extremity assist;From chair/3-in-1;With armrests Stand to Sit: 4: Min assist;With upper extremity assist;With armrests;To chair/3-in-1 Details for Transfer Assistance: Cues for hand placement and LE management with some assist for LLE when sitting.  Ambulation/Gait Ambulation/Gait Assistance: 4: Min assist Ambulation Distance (Feet): 48 Feet Assistive device: Rolling walker Ambulation/Gait Assistance Details: Max cues for sequencing/technique with RW, increasing UE WB to avoid antalgic gait pattern and to maintain upright posture.   Gait Pattern: Step-to pattern;Decreased stance time - left;Decreased step length - right;Antalgic;Trunk flexed    Exercises Total Joint Exercises Ankle Circles/Pumps: AROM;Both;20 reps Quad Sets: AROM;Left;10 reps Heel  Slides: AAROM;Left;10 reps Hip ABduction/ADduction: AAROM;Left;10 reps Straight Leg Raises: AAROM;Left;10 reps   PT Diagnosis:    PT Problem List:   PT Treatment Interventions:     PT Goals Acute Rehab PT Goals PT Goal Formulation: With patient Time For Goal Achievement: 10/02/12 Potential to Achieve Goals: Good Pt will go Sit to Stand: with supervision PT Goal: Sit to Stand - Progress: Progressing toward goal Pt will go Stand to Sit: with supervision PT Goal: Stand to Sit - Progress: Progressing toward goal Pt will Ambulate: 51 - 150 feet;with supervision;with least restrictive assistive device PT Goal: Ambulate - Progress: Progressing toward goal  Visit Information  Last PT Received On: 09/27/12 Assistance Needed: +2 (chair follow)    Subjective Data  Subjective: I'm doing better this afternoon.  Patient Stated Goal: to return home and get back to PLOF.    Cognition  Overall Cognitive Status: Appears within functional limits for tasks assessed/performed Arousal/Alertness: Awake/alert Orientation Level: Appears intact for tasks assessed Behavior During Session: [facility] for tasks performed    Balance     End of Session PT - End of Session Equipment Utilized During Treatment: Gait belt;Left knee immobilizer Activity Tolerance: Patient tolerated treatment well Patient left: in chair;with call bell/phone within reach;with family/visitor present Nurse Communication: Mobility status CPM Left Knee CPM Left Knee: On Left Knee Flexion (Degrees): 0  Left Knee Extension (Degrees): 60    GP     Shervon Kerwin 09/27/2012, 5:08 PM

## 2012-09-27 NOTE — Evaluation (Signed)
Physical Therapy Evaluation Patient Details Name: Kevin Campbell MRN: [id number] DOB: 06-06-1946 Today's Date: 09/27/2012 Time: 5621-3086 PT Time Calculation (min): 26 min  PT Assessment / Plan / Recommendation Clinical Impression  Pt presents s/p L TKA POD 1 with decreased strength, ROM and mobiltiy.  Tolerated OOB to chair with min assist, however deferred further ambulation due to nausea.  Pt will benefit from skilled PT in acute venue to address deficits. PT recommends HHPT for follow up at D/C to maximize pts independence.      PT Assessment  Patient needs continued PT services    Follow Up Recommendations  Home health PT    Does the patient have the potential to tolerate intense rehabilitation      Barriers to Discharge None      Equipment Recommendations  None recommended by PT    Recommendations for Other Services     Frequency 7X/week    Precautions / Restrictions Precautions Precautions: Knee Required Braces or Orthoses: Knee Immobilizer - Left Knee Immobilizer - Left: Discontinue once straight leg raise with < 10 degree lag Restrictions Weight Bearing Restrictions: No Other Position/Activity Restrictions: WBAT   Pertinent Vitals/Pain 8/10 pain, RN aware, ice packs applied.       Mobility  Bed Mobility Bed Mobility: Supine to Sit;Sitting - Scoot to Edge of Bed Supine to Sit: 4: Min assist Sitting - Scoot to [place] of Bed: 5: Supervision Details for Bed Mobility Assistance: Assist for LLE out of bed with cues for hand placement on bed to self assist into sitting.  Transfers Transfers: Sit to Stand;Stand to Sit;Stand Pivot Transfers Sit to Stand: 4: Min assist;From elevated surface;With upper extremity assist;From bed Stand to Sit: 4: Min assist;With upper extremity assist;With armrests;To chair/3-in-1 Stand Pivot Transfers: 4: Min assist Details for Transfer Assistance: Performed x 2 to allow rest due to nausea.  Assist to rise and steady with cues for  hand placement, technique and LE management. Pt able to take several small steps from bed to chair with cues for sequencing/technique with RW.  Deferred further ambulation due to nausea.  Ambulation/Gait Ambulation/Gait Assistance: Not tested (comment) Assistive device: Rolling walker    Shoulder Instructions     Exercises     PT Diagnosis: Difficulty walking;Generalized weakness;Acute pain  PT Problem List: Decreased strength;Decreased range of motion;Decreased activity tolerance;Decreased balance;Decreased mobility;Decreased knowledge of use of DME;Decreased knowledge of precautions;Pain;Decreased safety awareness PT Treatment Interventions: DME instruction;Gait training;Stair training;Functional mobility training;Therapeutic activities;Therapeutic exercise;Balance training;Patient/family education   PT Goals Acute Rehab PT Goals PT Goal Formulation: With patient Time For Goal Achievement: 10/02/12 Potential to Achieve Goals: Good Pt will go Supine/Side to Sit: with supervision PT Goal: Supine/Side to Sit - Progress: Goal set today Pt will go Sit to Supine/Side: with supervision PT Goal: Sit to Supine/Side - Progress: Goal set today Pt will go Sit to Stand: with supervision PT Goal: Sit to Stand - Progress: Goal set today Pt will go Stand to Sit: with supervision PT Goal: Stand to Sit - Progress: Goal set today Pt will Ambulate: 51 - 150 feet;with supervision;with least restrictive assistive device PT Goal: Ambulate - Progress: Goal set today Pt will Go Up / Down Stairs: 1-2 stairs;with min assist;with least restrictive assistive device PT Goal: Up/Down Stairs - Progress: Goal set today  Visit Information  Last PT Received On: 09/27/12 Assistance Needed: +2 (safety w/ amb due to nausea) PT/OT Co-Evaluation/Treatment: Yes    Subjective Data  Subjective: I think I'm going to throw up.  Patient Stated Goal: to return home and get back to PLOF.    Prior Functioning  Home  Living Lives With: Spouse Available Help at Discharge: Family;Available 24 hours/day Type of Home: House Home Access: Stairs to enter [employer] of Steps: 1 Home Layout: Two level;Able to live on main level with bedroom/bathroom Alternate Level Stairs-Number of Steps: 1 step down to bathroom Bathroom Shower/Tub: Engineer, manufacturing systems: Standard Home Adaptive Equipment: Walker - rolling;Straight cane Prior Function Level of Independence: Needs assistance Needs Assistance: Meal Prep;Light Housekeeping Meal Prep: Total Light Housekeeping: Total Driving: Yes Vocation: Retired Musician: No difficulties Dominant Hand: Right    Cognition  Overall Cognitive Status: Appears within functional limits for tasks assessed/performed Arousal/Alertness: Awake/alert Orientation Level: Appears intact for tasks assessed Behavior During Session: [facility] for tasks performed    Extremity/Trunk Assessment Right Lower Extremity Assessment RLE ROM/Strength/Tone: WFL for tasks assessed RLE Sensation: WFL - Light Touch RLE Coordination: WFL - gross/fine motor Left Lower Extremity Assessment LLE ROM/Strength/Tone: Deficits LLE ROM/Strength/Tone Deficits: able to perform SLR with some assist from therapist.  Ankle motions WFL.  LLE Sensation: WFL - Light Touch Trunk Assessment Trunk Assessment: Normal   Balance    End of Session PT - End of Session Equipment Utilized During Treatment: Gait belt;Left knee immobilizer Activity Tolerance: Other (comment) (limited by nausea. ) Patient left: in chair;with call bell/phone within reach;with family/visitor present Nurse Communication: Mobility status  GP     Page, Meribeth Mattes 09/27/2012, 9:35 AM

## 2012-09-27 NOTE — Progress Notes (Signed)
Physical Therapy Treatment Patient Details Name: Kevin Campbell MRN: [id number] DOB: March 30, 1946 Today's Date: 09/27/2012 Time: 2725-3664 PT Time Calculation (min): 29 min  PT Assessment / Plan / Recommendation Comments on Treatment Session  Pt doing much better this afternoon with no c/o nausea/dizziness.  Able to ambulate in hallway and perform exercises.     Follow Up Recommendations  Home health PT     Does the patient have the potential to tolerate intense rehabilitation     Barriers to Discharge        Equipment Recommendations  None recommended by PT    Recommendations for Other Services    Frequency 7X/week   Plan Discharge plan remains appropriate    Precautions / Restrictions Precautions Precautions: Knee Required Braces or Orthoses: Knee Immobilizer - Left Knee Immobilizer - Left: Discontinue once straight leg raise with < 10 degree lag Restrictions Weight Bearing Restrictions: No Other Position/Activity Restrictions: WBAT   Pertinent Vitals/Pain 9/10 pain.  Pt states that this is "better than what I'm used to"    Mobility  Bed Mobility Bed Mobility: Not assessed Transfers Transfers: Sit to Stand;Stand to Sit;Stand Pivot Transfers Sit to Stand: 4: Min assist;With upper extremity assist;From chair/3-in-1;With armrests Stand to Sit: 4: Min assist;With upper extremity assist;With armrests;To chair/3-in-1 Details for Transfer Assistance: Cues for hand placement and LE management with some assist for LLE when sitting.  Ambulation/Gait Ambulation/Gait Assistance: 4: Min assist Ambulation Distance (Feet): 48 Feet Assistive device: Rolling walker Ambulation/Gait Assistance Details: Max cues for sequencing/technique with RW, increasing UE WB to avoid antalgic gait pattern and to maintain upright posture.   Gait Pattern: Step-to pattern;Decreased stance time - left;Decreased step length - right;Antalgic;Trunk flexed    Exercises Total Joint Exercises Ankle  Circles/Pumps: AROM;Both;20 reps Quad Sets: AROM;Left;10 reps Heel Slides: AAROM;Left;10 reps Hip ABduction/ADduction: AAROM;Left;10 reps Straight Leg Raises: AAROM;Left;10 reps   PT Diagnosis:    PT Problem List:   PT Treatment Interventions:     PT Goals Acute Rehab PT Goals PT Goal Formulation: With patient Time For Goal Achievement: 10/02/12 Potential to Achieve Goals: Good Pt will go Sit to Stand: with supervision PT Goal: Sit to Stand - Progress: Progressing toward goal Pt will go Stand to Sit: with supervision PT Goal: Stand to Sit - Progress: Progressing toward goal Pt will Ambulate: 51 - 150 feet;with supervision;with least restrictive assistive device PT Goal: Ambulate - Progress: Progressing toward goal  Visit Information  Last PT Received On: 09/27/12 Assistance Needed: +2 (chair follow)    Subjective Data  Subjective: I'm doing better this afternoon.  Patient Stated Goal: to return home and get back to PLOF.    Cognition  Overall Cognitive Status: Appears within functional limits for tasks assessed/performed Arousal/Alertness: Awake/alert Orientation Level: Appears intact for tasks assessed Behavior During Session: [facility] for tasks performed    Balance     End of Session PT - End of Session Equipment Utilized During Treatment: Gait belt;Left knee immobilizer Activity Tolerance: Patient tolerated treatment well Patient left: in chair;with call bell/phone within reach;with family/visitor present Nurse Communication: Mobility status   GP     Page, Meribeth Mattes 09/27/2012, 3:08 PM

## 2012-09-27 NOTE — Progress Notes (Signed)
Subjective: Patient doing better at this point had some problems with nausea and vomiting yesterday after eating and after given some IV pain medicine. Patient denies any shortness breath chest pain had a difficult night with sleep   Objective: Vital signs in last 24 hours: Temp:  [97.5 F (36.4 C)-98.6 F (37 C)] 98.6 F (37 C) (12/18 0536) Pulse Rate:  [71-81] 71  (12/18 0536) Resp:  [14-18] 16  (12/18 0536) BP: (123-147)/(73-88) 132/78 mmHg (12/18 0536) SpO2:  [95 %-99 %] 96 % (12/18 0536) FiO2 (%):  [95 %] 95 % (12/17 1600) Weight:  [112.492 kg (248 lb)] 112.492 kg (248 lb) (12/17 1600)  Intake/Output from previous day: 12/17 0701 - 12/18 0700 In: 5552.5 [P.O.:1080; I.V.:4272.5; IV Piggyback:200] Out: 1815 [Urine:1600; Drains:215] Intake/Output this shift: Total I/O In: 1552.5 [P.O.:480; I.V.:872.5; IV Piggyback:200] Out: 850 [Urine:825; Drains:25]   Basename 09/27/12 0438  HGB 12.4*    Basename 09/27/12 0438  WBC 9.4  RBC 4.20*  HCT 36.6*  PLT 176    Basename 09/27/12 0438  NA 135  K 4.3  CL 101  CO2 28  BUN 13  CREATININE 1.24  GLUCOSE 152*  CALCIUM 8.6   No results found for this basename: LABPT:2,INR:2 in the last 72 hours  Patient is conscious alert appropriate appears to be in no extreme distress lung sounds were clear throughout his heart was regular rate and rhythm his abdomen was soft bowel sounds present his dressing on his left leg where intact Hemovac was DC'd intact his calf and thigh were soft and nontender his leg and foot was neuromotor vascularly intact  Assessment/Plan: Postop day #1 status post left total knee arthroplasty doing well Postoperative nausea and vomiting improved History of recent diagnosis of prostate cancer Foley cath History of hypertension stable GERD some issues with this last night  Plan patient out of bed with physical therapy and CPM today per total knee protocol will decrease IV hydration today. Will DC Foley  catheter today. Patient will be placed on Protonix for his GERD. Expected probable discharge home on Friday with a dressing change tomorrow morning   Jamelle Rushing 09/27/2012, 6:55 AM

## 2012-09-28 LAB — CBC
HCT: 34.6 % — ABNORMAL LOW (ref 39.0–52.0)
Hemoglobin: 11.6 g/dL — ABNORMAL LOW (ref 13.0–17.0)
MCH: 29.1 pg (ref 26.0–34.0)
MCHC: 33.5 g/dL (ref 30.0–36.0)
RDW: 13 % (ref 11.5–15.5)

## 2012-09-28 NOTE — Care Management Note (Signed)
    Page 1 of 2   09/29/2012     1:07:54 PM   CARE MANAGEMENT NOTE 09/29/2012  Patient:  Kevin Campbell, Kevin Campbell   Account Number:  [id number]  Date Initiated:  09/28/2012  Documentation initiated by:  Colleen Can  Subjective/Objective Assessment:   dx osteoarthritis left knee; total knee replacemnt on day of admission.  Worker's Comp claim#[id number]  [id number]  Cecille Aver  Case worker [employer] [phone]  [phone]     Action/Plan:   CM spoke with patient and spouse. Plans are for him to return to his home in [place] where spouse will be caregiver. He already has RW but will need 3N1.   Anticipated DC Date:  09/29/2012   Anticipated DC Plan:  HOME W HOME HEALTH SERVICES  In-house referral  NA      DC Planning Services  CM consult      PAC Choice  DURABLE MEDICAL EQUIPMENT  HOME HEALTH   Choice offered to / List presented to:  C-1 Patient   DME arranged  3-N-1      DME agency  OTHER - SEE NOTE     HH arranged  HH-2 PT  HH-3 OT      HH agency  OTHER - SEE NOTE   Status of service:  Completed, signed off Medicare Important Message given?  NO (If response is "NO", the following Medicare IM given date fields will be blank) Date Medicare IM given:   Date Additional Medicare IM given:    Discharge Disposition:  HOME W HOME HEALTH SERVICES  Per UR Regulation:  Reviewed for med. necessity/level of care/duration of stay  If discussed at Long Length of Stay Meetings, dates discussed:    Comments:  09/29/2012 Colleen Can BSN RN CCM [phone] Received call from one call management-Heather [phone]) requesting HHorders, h&p, op note, PT notes, face sheet be faxed to [phone]/confirmation received CPM orders faxed to on call managemnt also. 12:00 Cm called to [employer] [facility] [employer]-spoke with Herbert Seta who advised that she would be responsible for calling patient with [facility] agency information. [employer]  Managemnt will also be responsible for calling patient regarding CPM machine. Contact information along with above information given to patient and spouse. States they have had contact with Worker's Comp Sports coach. Pt for discharge today.  09/28/2012 Colleen Can BSN RN CCM [phone] Genevieve Norlander notified CM that they had been authorized to provide pt's [facility] services. Orders for dme-3N1 have also been faxed to Jonne Ply at request to fax [phone] with confirmation.

## 2012-09-28 NOTE — Progress Notes (Signed)
Physical Therapy Treatment Patient Details Name: Kevin Campbell MRN: [id number] DOB: June 06, 1946 Today's Date: 09/28/2012 Time: 4098-1191 PT Time Calculation (min): 38 min  PT Assessment / Plan / Recommendation Comments on Treatment Session  Pt doing much better this afternoon with no c/o nausea/dizziness.  Able to ambulate in hallway and perform exercises.     Follow Up Recommendations  Home health PT     Does the patient have the potential to tolerate intense rehabilitation     Barriers to Discharge        Equipment Recommendations  None recommended by PT    Recommendations for Other Services    Frequency 7X/week   Plan Discharge plan remains appropriate    Precautions / Restrictions Precautions Precautions: Knee Required Braces or Orthoses: Knee Immobilizer - Left Knee Immobilizer - Left: Discontinue once straight leg raise with < 10 degree lag Restrictions Weight Bearing Restrictions: No Other Position/Activity Restrictions: WBAT   Pertinent Vitals/Pain 7/10; MEDS requested, cold pack provided    Mobility  Transfers Transfers: Sit to Stand;Stand to Sit;Stand Pivot Transfers Sit to Stand: 4: Min guard;4: Min assist;With upper extremity assist;From chair/3-in-1;With armrests Stand to Sit: 4: Min guard;With upper extremity assist;With armrests;To chair/3-in-1 Details for Transfer Assistance: Cues for hand placement and LE management with some assist for LLE when sitting Ambulation/Gait Ambulation/Gait Assistance: 4: Min assist Ambulation Distance (Feet): 122 Feet Assistive device: Rolling walker Ambulation/Gait Assistance Details: cues for posture, stride length, step to gait, position from RW and sequence Gait Pattern: Step-to pattern;Decreased stance time - left;Decreased step length - right;Antalgic;Trunk flexed Gait velocity: slow    Exercises Total Joint Exercises Ankle Circles/Pumps: AROM;Both;20 reps Quad Sets: AROM;Left;20 reps;Supine Heel Slides:  AAROM;Left;20 reps Straight Leg Raises: AAROM;Left;20 reps;Supine   PT Diagnosis:    PT Problem List:   PT Treatment Interventions:     PT Goals Acute Rehab PT Goals PT Goal Formulation: With patient Time For Goal Achievement: 10/02/12 Potential to Achieve Goals: Good Pt will go Sit to Stand: with supervision PT Goal: Sit to Stand - Progress: Progressing toward goal Pt will go Stand to Sit: with supervision PT Goal: Stand to Sit - Progress: Progressing toward goal Pt will Ambulate: 51 - 150 feet;with supervision;with least restrictive assistive device PT Goal: Ambulate - Progress: Progressing toward goal  Visit Information  Last PT Received On: 09/28/12 Assistance Needed: +1    Subjective Data  Subjective: I'm doing better than yesterday Patient Stated Goal: to return home and get back to PLOF.    Cognition  Overall Cognitive Status: Appears within functional limits for tasks assessed/performed Arousal/Alertness: Awake/alert Orientation Level: Appears intact for tasks assessed Behavior During Session: [facility] for tasks performed    Balance     End of Session PT - End of Session Activity Tolerance: Patient tolerated treatment well Patient left: in chair;with call bell/phone within reach;with family/visitor present Nurse Communication: Mobility status   GP     Tammala Weider 09/28/2012, 1:02 PM

## 2012-09-28 NOTE — Progress Notes (Signed)
Physical Therapy Treatment Patient Details Name: Kevin Campbell MRN: [id number] DOB: [birth year] Today's Date: 09/28/2012 Time: 4098-1191 PT Time Calculation (min): 42 min  PT Assessment / Plan / Recommendation Comments on Treatment Session       Follow Up Recommendations  Home health PT     Does the patient have the potential to tolerate intense rehabilitation     Barriers to Discharge        Equipment Recommendations  None recommended by PT    Recommendations for Other Services    Frequency 7X/week   Plan Discharge plan remains appropriate    Precautions / Restrictions Precautions Precautions: Knee Required Braces or Orthoses: Knee Immobilizer - Left Knee Immobilizer - Left: Discontinue once straight leg raise with < 10 degree lag Restrictions Weight Bearing Restrictions: No Other Position/Activity Restrictions: WBAT   Pertinent Vitals/Pain 6/10; premedicated, ice packs provided    Mobility  Bed Mobility Bed Mobility: Sit to Supine Sit to Supine: 4: Min assist Details for Bed Mobility Assistance: cues for sequence and assist with L LE Transfers Transfers: Sit to Stand;Stand to Sit Sit to Stand: 4: Min guard Stand to Sit: 4: Min assist Details for Transfer Assistance: Cues for hand placement and LE management with some assist for LLE when sitting Ambulation/Gait Ambulation/Gait Assistance: 4: Min assist Ambulation Distance (Feet): 123 Feet Assistive device: Rolling walker Ambulation/Gait Assistance Details: cues for sequence, posture, stride length, step to gait and position from RW Gait Pattern: Step-to pattern;Decreased stance time - left;Decreased step length - right;Antalgic;Trunk flexed Gait velocity: slow    Exercises     PT Diagnosis:    PT Problem List:   PT Treatment Interventions:     PT Goals Acute Rehab PT Goals PT Goal Formulation: With patient Time For Goal Achievement: 10/02/12 Potential to Achieve Goals: Good Pt will go Supine/Side  to Sit: with supervision PT Goal: Supine/Side to Sit - Progress: Progressing toward goal Pt will go Sit to Supine/Side: with supervision PT Goal: Sit to Supine/Side - Progress: Progressing toward goal Pt will go Sit to Stand: with supervision PT Goal: Sit to Stand - Progress: Progressing toward goal Pt will go Stand to Sit: with supervision PT Goal: Stand to Sit - Progress: Progressing toward goal Pt will Ambulate: 51 - 150 feet;with supervision;with least restrictive assistive device PT Goal: Ambulate - Progress: Progressing toward goal  Visit Information  Last PT Received On: 09/28/12 Assistance Needed: +1    Subjective Data  Patient Stated Goal: to return home and get back to PLOF.    Cognition  Overall Cognitive Status: Appears within functional limits for tasks assessed/performed Arousal/Alertness: Awake/alert Orientation Level: Appears intact for tasks assessed Behavior During Session: [facility] for tasks performed    Balance     End of Session PT - End of Session Equipment Utilized During Treatment: Left knee immobilizer Activity Tolerance: Patient tolerated treatment well;Patient limited by fatigue Patient left: in bed;with call bell/phone within reach;with family/visitor present Nurse Communication: Mobility status   GP     Kevin Campbell 09/28/2012, 3:53 PM

## 2012-09-28 NOTE — Progress Notes (Signed)
Occupational Therapy Treatment Patient Details Name: Kevin Campbell MRN: [id number] DOB: February 04, 1946 Today's Date: 09/28/2012 Time: 4098-1191 OT Time Calculation (min): 29 min  OT Assessment / Plan / Recommendation Comments on Treatment Session Pt doing much better today. O2 sats in low 90s on RA following ambulation to the bathroom and light ADL activity. 2/4 DOE. Instructed pt to take frequent rest breaks and pace self. Also educated in PLB. Now recommending HHOT.    Follow Up Recommendations  Home health OT    Barriers to Discharge       Equipment Recommendations  3 in 1 bedside comode    Recommendations for Other Services    Frequency Min 2X/week   Plan Discharge plan remains appropriate    Precautions / Restrictions Precautions Precautions: Knee Required Braces or Orthoses: Knee Immobilizer - Left Knee Immobilizer - Left: Discontinue once straight leg raise with < 10 degree lag Restrictions Weight Bearing Restrictions: No   Pertinent Vitals/Pain Reported 7/10 pain with activity. Cold applied and repositioned for comfort.    ADL  Grooming: Wash/dry hands;Min guard Where Assessed - Grooming: Supported standing Lower Body Dressing: Minimal assistance Where Assessed - Lower Body Dressing: Supported sit to Pharmacist, hospital: Hydrographic surveyor Method: Sit to Barista: Raised toilet seat with arms (or 3-in-1 over toilet) Toileting - Clothing Manipulation and Hygiene: Min guard Where Assessed - Toileting Clothing Manipulation and Hygiene: Sit to stand from 3-in-1 or toilet Equipment Used: Rolling walker Transfers/Ambulation Related to ADLs: Pt ambulatedt to the bathroom with minguard A. Pt has tendency to "slam" RLE during ambulation 2* being unable to compensate with UEs. on RW. Pt with BUE rotator cuff injuries.  ADL Comments: Pt educated correct technique for LB dressing. Instructed in use of reacher with good return demo. Min A needed  to thread LLE into pant leg.    OT Diagnosis:    OT Problem List:   OT Treatment Interventions:     OT Goals ADL Goals ADL Goal: Grooming - Progress: Progressing toward goals ADL Goal: Lower Body Dressing - Progress: Progressing toward goals ADL Goal: Toilet Transfer - Progress: Progressing toward goals ADL Goal: Toileting - Clothing Manipulation - Progress: Progressing toward goals ADL Goal: Toileting - Hygiene - Progress: Progressing toward goals  Visit Information  Last OT Received On: 09/28/12 Assistance Needed: +1    Subjective Data  Subjective: I'm doing much better today.   Prior Functioning       Cognition  Overall Cognitive Status: Appears within functional limits for tasks assessed/performed Arousal/Alertness: Awake/alert Orientation Level: Appears intact for tasks assessed Behavior During Session: [facility] for tasks performed    Mobility  Shoulder Instructions Transfers Sit to Stand: 4: Min guard;4: Min assist;With upper extremity assist;From chair/3-in-1;With armrests Stand to Sit: 4: Min guard;With upper extremity assist;With armrests;To chair/3-in-1 Details for Transfer Assistance: Cues for hand placement and LE management with some assist for LLE when sitting       Exercises      Balance     End of Session OT - End of Session Activity Tolerance: Patient tolerated treatment well Patient left: in chair;with call bell/phone within reach;with family/visitor present  GO     Ebrahim Deremer A OTR/L [phone] 09/28/2012, 10:45 AM

## 2012-09-28 NOTE — Progress Notes (Signed)
Subjective: Patient doing well this morning without any significant complaints just had a difficult time yesterday with the CPM. Patient is not having any nausea any longer and he is tolerating the pain medicine while   Objective: Vital signs in last 24 hours: Temp:  [98.5 F (36.9 C)-99.6 F (37.6 C)] 99.6 F (37.6 C) (12/19 0617) Pulse Rate:  [71-89] 71  (12/19 0617) Resp:  [16-18] 16  (12/19 0617) BP: (116-128)/(66-77) 127/69 mmHg (12/19 0617) SpO2:  [93 %-96 %] 93 % (12/19 0617)  Intake/Output from previous day: 12/18 0701 - 12/19 0700 In: 1942.1 [P.O.:820; I.V.:1122.1] Out: 1300 [Urine:1300] Intake/Output this shift: Total I/O In: 620 [P.O.:220; I.V.:400] Out: 600 [Urine:600]   Basename 09/28/12 0433 09/27/12 0438  HGB 11.6* 12.4*    Basename 09/28/12 0433 09/27/12 0438  WBC 8.6 9.4  RBC 3.99* 4.20*  HCT 34.6* 36.6*  PLT 161 176    Basename 09/27/12 0438  NA 135  K 4.3  CL 101  CO2 28  BUN 13  CREATININE 1.24  GLUCOSE 152*  CALCIUM 8.6   No results found for this basename: LABPT:2,INR:2 in the last 72 hours  Patient is conscious alert and appropriate appears to be very comfortable his left leg dressing was intact it was taken down the wound is well approximated with Steri-Strips he had no signs of infection no pressure blisters he had no drainage his thigh was and calf were soft and nontender his leg was neuromotor vascularly intact  Assessment/Plan: Postop day #2 status post left total knee arthroplasty doing buried well Hypertension stable  Plan continued out of bed with physical therapy CPM per protocol expected probable discharge to home tomorrow with home health physical therapy   Jamelle Rushing 09/28/2012, 6:29 AM

## 2012-09-29 ENCOUNTER — Inpatient Hospital Stay (HOSPITAL_COMMUNITY): Payer: Worker's Compensation

## 2012-09-29 LAB — CBC
MCH: 29 pg (ref 26.0–34.0)
MCHC: 33.4 g/dL (ref 30.0–36.0)
MCV: 86.8 fL (ref 78.0–100.0)
Platelets: 146 10*3/uL — ABNORMAL LOW (ref 150–400)
RDW: 13.1 % (ref 11.5–15.5)

## 2012-09-29 MED ORDER — ASPIRIN 325 MG PO TABS: 325.0000 mg | ORAL_TABLET | Freq: Two times a day (BID) | ORAL | Status: DC

## 2012-09-29 MED ORDER — METHOCARBAMOL 500 MG PO TABS: 500.0000 mg | ORAL_TABLET | Freq: Four times a day (QID) | ORAL | Status: DC | PRN

## 2012-09-29 MED ORDER — OXYCODONE HCL 5 MG PO TABS
5.0000 mg | ORAL_TABLET | ORAL | Status: DC | PRN
Start: 2012-09-29 — End: 2012-10-25

## 2012-09-29 MED ORDER — DSS 100 MG PO CAPS: 100.0000 mg | ORAL_CAPSULE | Freq: Two times a day (BID) | ORAL | Status: DC

## 2012-09-29 MED ORDER — FERROUS SULFATE 325 (65 FE) MG PO TABS: 325.0000 mg | ORAL_TABLET | Freq: Three times a day (TID) | ORAL | Status: DC

## 2012-09-29 MED ORDER — AMOXICILLIN-POT CLAVULANATE 875-125 MG PO TABS: 1.0000 | ORAL_TABLET | Freq: Two times a day (BID) | ORAL | Status: AC

## 2012-09-29 NOTE — Progress Notes (Signed)
Physical Therapy Treatment Patient Details Name: Kevin Campbell MRN: [id number] DOB: Aug 07, 1946 Today's Date: 09/29/2012 Time: 4098-1191 PT Time Calculation (min): 39 min  PT Assessment / Plan / Recommendation Comments on Treatment Session       Follow Up Recommendations  Home health PT     Does the patient have the potential to tolerate intense rehabilitation     Barriers to Discharge        Equipment Recommendations  None recommended by PT    Recommendations for Other Services    Frequency 7X/week   Plan Discharge plan remains appropriate    Precautions / Restrictions Precautions Precautions: Knee Required Braces or Orthoses: Knee Immobilizer - Left Knee Immobilizer - Left: Discontinue once straight leg raise with < 10 degree lag Restrictions Weight Bearing Restrictions: No Other Position/Activity Restrictions: WBAT   Pertinent Vitals/Pain 5/10; premedicated, cold packs provided    Mobility  Bed Mobility Bed Mobility: Supine to Sit Supine to Sit: 4: Min guard Details for Bed Mobility Assistance: cues for sequence and assist with L LE Transfers Transfers: Sit to Stand;Stand to Sit Sit to Stand: 5: Supervision Stand to Sit: 5: Supervision Details for Transfer Assistance: Cues for hand placement and LE management with some assist for LLE when sitting Ambulation/Gait Ambulation/Gait Assistance: 4: Min guard;5: Supervision Ambulation Distance (Feet): 80 Feet Assistive device: Rolling walker Ambulation/Gait Assistance Details: cues for position from RW, posture and increased L LE WB Gait Pattern: Step-to pattern;Decreased stance time - left;Decreased step length - right;Antalgic;Trunk flexed Gait velocity: slow Stairs: Yes Stairs Assistance: 4: Min assist Stairs Assistance Details (indicate cue type and reason): cues for sequence and for foot/RW placement Stair Management Technique: Backwards;With walker;Step to pattern Number of Stairs: 1  (twice)     Exercises Total Joint Exercises Ankle Circles/Pumps: AROM;Both;20 reps Quad Sets: AROM;Left;20 reps;Supine Heel Slides: AAROM;Left;20 reps Straight Leg Raises: AAROM;Left;20 reps;Supine;AROM   PT Diagnosis:    PT Problem List:   PT Treatment Interventions:     PT Goals Acute Rehab PT Goals PT Goal Formulation: With patient Time For Goal Achievement: 10/02/12 Potential to Achieve Goals: Good Pt will go Supine/Side to Sit: with supervision PT Goal: Supine/Side to Sit - Progress: Progressing toward goal Pt will go Sit to Supine/Side: with supervision PT Goal: Sit to Supine/Side - Progress: Met Pt will go Sit to Stand: with supervision PT Goal: Sit to Stand - Progress: Met Pt will go Stand to Sit: with supervision PT Goal: Stand to Sit - Progress: Met Pt will Ambulate: 51 - 150 feet;with supervision;with least restrictive assistive device PT Goal: Ambulate - Progress: Met Pt will Go Up / Down Stairs: 1-2 stairs;with min assist;with least restrictive assistive device PT Goal: Up/Down Stairs - Progress: Met  Visit Information  Last PT Received On: 09/29/12 Assistance Needed: +1    Subjective Data  Subjective: I'm doing better than yesterday Patient Stated Goal: to return home and get back to PLOF.    Cognition  Overall Cognitive Status: Appears within functional limits for tasks assessed/performed Arousal/Alertness: Awake/alert Orientation Level: Appears intact for tasks assessed Behavior During Session: [facility] for tasks performed    Balance     End of Session PT - End of Session Equipment Utilized During Treatment: Left knee immobilizer Activity Tolerance: Patient tolerated treatment well;Patient limited by fatigue Patient left: in chair;with call bell/phone within reach;with family/visitor present Nurse Communication: Mobility status   GP     Larrissa Stivers 09/29/2012, 12:06 PM

## 2012-09-29 NOTE — Discharge Summary (Signed)
Physician Discharge Summary  Patient ID: Kevin Campbell MRN: [id number] DOB/AGE: 17-Jan-1946 66 y.o.  Admit date: 09/26/2012 Discharge date: 09/29/2012  Admission Diagnoses: End-stage osteoarthritis left knee Obesity Hypertension GERD History kidney disease BPH History of coronary artery disease  Discharge Diagnoses:  Status post left total knee arthroplasty without complications Postoperative temperature chest x-ray negative history of preoperative sinusitis and respiratory infection restarted preoperative antibiotics for her respiratory infection Obesity Hypertension History of BPH History kidney disease GERD History of coronary disease asymptomatic  Discharged Condition: good  Hospital Course: Patient was admitted [facility] in the care Dr. Valma Cava patient was taken to the or were a left total knee arthroplasty was performed without any complications patient was transferred to recovery room then to the orthopedic floor in good condition. Patient then occurred 3 days postoperative course was she just had a gradual increase of his temperature 101 temperature but he remained asymptomatic no shortness of breath.productive cough. His lungs remained clear chest x-ray was negative for any signs of pneumonia. Patient's white count discharge was 8.5. Patient otherwise did well during his postoperative course 3 days on the orthopedic floor anticipated daily with physical therapy and CPM without issues. His Hemovac drain was DC'd on postop day #1 he was transitional 5 ball IV fluids on postop day #2 was tolerating his pain meds well. His wound remained benign for any signs of infection his leg remain neuromotor vascularly intact. His calf is soft thigh were soft and nontender. On postop day #3 his white count was 8.5 his chest x-ray was negative so was allowed to discharge home with a 2 week followup with Dr. Thomasena Edis for postoperative check and if he continues to have issues  after completing a postoperative antibiotic he would followup with his primary care physician for his right continued sinusitis  Consults: None  Significant Diagnostic Studies: Routine postop labs with additional postop checks x-ray which was negative for any signs of pneumonia  Treatments: Routine total knee arthroplasty protocol treatment  Discharge Exam: Blood pressure 139/81, pulse 86, temperature 101.1 F (38.4 C), temperature source Oral, resp. rate 16, height 5\' 7"  (1.702 m), weight 112.492 kg (248 lb), SpO2 94.00%. Patient is conscious alert appropriate he had no signs of any distress. His lung sounds were clear throughout he had no paraspinous tenderness. His left leg was wound was well approximated with Steri-Strips no signs of infection no increased   Disposition: 01-Home or Self Care  Discharge Orders    Future Appointments: Provider: Department: Dept Phone: Center:   10/25/2012 9:30 AM [facility] Nurse [facility] [phone] None   10/25/2012 10:00 AM Oneita Hurt, MD [facility] [phone] None     Future Orders Please Complete By Expires   Diet general      Call MD / Call 911      Comments:   If you experience chest pain or shortness of breath, CALL 911 and be transported to the hospital emergency room.  If you develope a fever above 101 F, pus (white drainage) or increased drainage or redness at the wound, or calf pain, call your surgeon's office.   Increase activity slowly as tolerated      Discharge instructions      Comments:   Call your primary care physician for a followup appointment about your previous sinusitis. Call [phone] for followup appointment with Dr. Thomasena Edis in 2 weeks for your total knee arthroplasty.   CPM  Comments:   Continuous passive motion machine (CPM):      Use the CPM from 0 to 50 for 6 hours per day.      You may increase by 10 per day.  You may break it up into 2  or 3 sessions per day.      Use CPM for 3 weeks or until you are told to stop.   TED hose      Comments:   Use stockings (TED hose) for 2 weeks on both leg(s).  You may remove them at night for sleeping.   Change dressing      Comments:   Change dressing daily with sterile 4 x 4 inch gauze dressing and apply TED hose.  You may clean the incision with alcohol prior to redressing.   Do not put a pillow under the knee. Place it under the heel.          Medication List     As of 09/29/2012  3:49 PM    TAKE these medications         amoxicillin-clavulanate 875-125 MG per tablet   Commonly known as: AUGMENTIN   Take 1 tablet by mouth 2 (two) times daily.      aspirin 325 MG tablet   Take 1 tablet (325 mg total) by mouth 2 (two) times daily.      DSS 100 MG Caps   Take 100 mg by mouth 2 (two) times daily.      ferrous sulfate 325 (65 FE) MG tablet   Take 1 tablet (325 mg total) by mouth 3 (three) times daily after meals.      guaiFENesin 100 MG/5ML liquid   Commonly known as: ROBITUSSIN   Take 200 mg by mouth 3 (three) times daily as needed.      methocarbamol 500 MG tablet   Commonly known as: ROBAXIN   Take 1 tablet (500 mg total) by mouth every 6 (six) hours as needed.      oxyCODONE 5 MG immediate release tablet   Commonly known as: Oxy IR/ROXICODONE   Take 1-2 tablets (5-10 mg total) by mouth every 3 (three) hours as needed.         SignedJamelle Rushing 09/29/2012, 3:49 PM

## 2012-09-29 NOTE — Progress Notes (Signed)
Discharge summary sent to payer through [employer]  

## 2012-09-29 NOTE — Progress Notes (Signed)
Subjective: Patient states he feels like better today though he does feel like he does have a little sweats last night and he does feel occasional a little bit of a cough. He denies any other complaints of shortness of breath nausea his pain is well controlled   Objective: Vital signs in last 24 hours: Temp:  [100 F (37.8 C)-101.1 F (38.4 C)] 101.1 F (38.4 C) (12/20 0534) Pulse Rate:  [86-90] 86  (12/20 0534) Resp:  [16] 16  (12/20 0534) BP: (104-139)/(64-81) 139/81 mmHg (12/20 0534) SpO2:  [93 %-100 %] 94 % (12/20 0534)  Intake/Output from previous day: 12/19 0701 - 12/20 0700 In: 1500 [P.O.:1500] Out: 1875 [Urine:1875] Intake/Output this shift:     Basename 09/29/12 0410 09/28/12 0433 09/27/12 0438  HGB 10.8* 11.6* 12.4*    Basename 09/29/12 0410 09/28/12 0433  WBC 8.5 8.6  RBC 3.72* 3.99*  HCT 32.3* 34.6*  PLT 146* 161    Basename 09/27/12 0438  NA 135  K 4.3  CL 101  CO2 28  BUN 13  CREATININE 1.24  GLUCOSE 152*  CALCIUM 8.6   No results found for this basename: LABPT:2,INR:2 in the last 72 hours  Patient is conscious alert and appropriate comfortable-appearing in bed. His skin is a little bit moist. His lungs he is clear throughout both anterior and posteriorly. His left knee wound is well approximated with Steri-Strips no signs of infection his calf and thigh are soft nontender his leg is neuromotor vascularly intact  Assessment/Plan: Postop day #3 status post left total knee arthroplasty doing very well Low-grade temp with preoperative sinusitis respiratory infection we'll get a chest x-ray his lungs are clear today will send home on by mouth antibiotics and followup with PMD Hypertension stable GERD stable Coronary artery disease asymptomatic  Plan at this time are to have him continue with physical therapy this morning we'll get a chest x-ray evaluation of his chest. It is clear will send him home on respiratory antibiotic prophylaxis followup with  his primary care physician did was total knee arthroplasty. We'll convert him over to aspirin 325 mg twice a day for DVT prophylaxis and a 2 week followup appointment with Dr. Kristine Garbe 09/29/2012, 7:03 AM

## 2012-10-24 ENCOUNTER — Encounter: Payer: Self-pay | Admitting: Radiation Oncology

## 2012-10-24 ENCOUNTER — Encounter: Payer: Self-pay | Admitting: *Deleted

## 2012-10-24 NOTE — Progress Notes (Signed)
New Consult [place] 08/24/12 Bx=Adenocarcinoma,gleason=4+3=7, & 3+3=6,PSA=5.75,Volume=23cc(5/12)cores +  Married, 2 children, alert,oriented x3, walking slow steady gait with a cane, s/p 1 month total left  knee replacement No dysuria, weak stream, occasional not emptying bladder fully stated regular bowel movements, pain 4 in November 15, 2022 pain scale,   Father deceased of Lung cancer age 65,dx age 35, twin sister breast cancer age 11 living, dx age 87,    Allergies:Benadryl-SOB,(Celebrex,Codeine,Hydrocodone=intolerance)  No hx Radiation No Hx Pacemaker

## 2012-10-25 ENCOUNTER — Telehealth: Payer: Self-pay | Admitting: *Deleted

## 2012-10-25 ENCOUNTER — Ambulatory Visit
Admission: RE | Admit: 2012-10-25 | Discharge: 2012-10-25 | Disposition: A | Discharge status: Home / Self Care | Payer: BC Managed Care – PPO | Source: Ambulatory Visit | Attending: Radiation Oncology | Admitting: Radiation Oncology

## 2012-10-25 ENCOUNTER — Encounter: Payer: Self-pay | Admitting: Radiation Oncology

## 2012-10-25 VITALS — BP 152/101 | HR 80 | Temp 98.0°F | Resp 20 | Ht 67.0 in | Wt 244.1 lb

## 2012-10-25 DIAGNOSIS — C61 Malignant neoplasm of prostate: Secondary | ICD-10-CM | POA: Insufficient documentation

## 2012-10-25 DIAGNOSIS — Z96659 Presence of unspecified artificial knee joint: Secondary | ICD-10-CM | POA: Insufficient documentation

## 2012-10-25 DIAGNOSIS — E669 Obesity, unspecified: Secondary | ICD-10-CM | POA: Insufficient documentation

## 2012-10-25 DIAGNOSIS — I2510 Atherosclerotic heart disease of native coronary artery without angina pectoris: Secondary | ICD-10-CM | POA: Insufficient documentation

## 2012-10-25 DIAGNOSIS — K219 Gastro-esophageal reflux disease without esophagitis: Secondary | ICD-10-CM | POA: Insufficient documentation

## 2012-10-25 DIAGNOSIS — Z7982 Long term (current) use of aspirin: Secondary | ICD-10-CM | POA: Insufficient documentation

## 2012-10-25 NOTE — Telephone Encounter (Signed)
CALLED PATIENT TO INFORM OF GOLD SEED PLACEMENT ON 11/28/12 AT 8:45 AM AT DR. Margrett Rud OFFICE AND HIS SIM ON 12-01-12 AT 10:00 AM AT DR. MANNING'S OFFICE, SPOKE WITH PATIENT AND HE IS AWARE OF THESE APPTS.

## 2012-10-25 NOTE — Progress Notes (Signed)
Radiation Oncology         [phone] ________________________________  Initial outpatient Consultation  Name: Kevin Campbell MRN: [id number]  Date: 10/25/2012  DOB: Oct 25, 1945  WU:[id number] Sharen Hones, MD  Garnett Farm, MD   REFERRING PHYSICIAN: Garnett Farm, MD  DIAGNOSIS: 67 y.o. 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 gentleman.  He was noted to have an elevated PSA of 5.78 by his primary care physician, Dr. Sharen Hones.  Accordingly, he was referred for evaluation in urology by Dr. Vernie Ammons on 08/08/12,  digital rectal examination was performed at that time revealing induration of the left prostate.  The patient proceeded to transrectal ultrasound with 12 biopsies of the prostate on 08/24/12.  The prostate volume measured 22.83 cc.  Out of 12 core biopsies, 5 were positive.  The maximum Gleason score was 4+3, and this was seen in 40% of the left lateral mid specimen. Gleason's 3+4 disease was seen in 70% of the left base, 20% left lateral base, and 50% of the left mid gland. Gleason's 3+3 was seen in 40% of the left apex..  The patient reviewed the biopsy results with his urologist and he has kindly been referred today for discussion of potential radiation treatment options.  PREVIOUS RADIATION THERAPY: No  PAST MEDICAL HISTORY:  has a past medical history of Arthritis; Seasonal allergies; Colon polyps (2011); BPH (benign prostatic hypertrophy); History of chicken pox; GERD (gastroesophageal reflux disease); CAD (coronary artery disease) (2007); Obesity; Prediabetes; URI (upper respiratory infection) (09-20-12); Depression; and Prostate cancer (08/24/12).    PAST SURGICAL HISTORY: Past Surgical History  Procedure Date  . Knee surgery 2013    Left - torn ligaments  . Shoulder surgery 2013    Right - biceps tear  . Clavicle surgery 1968    Left  .  Colonoscopy 2011    1 polyp sigmoid (hyperplastic), diverticulosis, int hemorrhoids [name] at [facility])  . Cardiovascular stress test 11/2010    Normal lexiscan nuclear study with inferior thinning but no ischemia  . Total knee arthroplasty 09/26/2012    Procedure: TOTAL KNEE ARTHROPLASTY;  Surgeon: Eugenia Mcalpine, MD;  Location: [facility]S;  Service: Orthopedics;  Laterality: Left;  . Prostate biopsy 08/24/12    Adenocarcinoma    FAMILY HISTORY: family history includes Arthritis in his mother; Cancer in his father and sister; and Coronary artery disease (age of onset:65) in his mother.  There is no history of Stroke and Diabetes.  SOCIAL HISTORY:  reports that he quit smoking about 22 years ago. His smoking use included Cigars. He has never used smokeless tobacco. He reports that he does not drink alcohol or use illicit drugs.  ALLERGIES: Benadryl; Celebrex; Codeine; and Hydrocodone  MEDICATIONS:  Current Outpatient Prescriptions  Medication Sig Dispense Refill  . aspirin (BAYER ASPIRIN) 325 MG tablet Take 1 tablet (325 mg total) by mouth 2 (two) times daily.  60 tablet  0  . docusate sodium 100 MG CAPS Take 100 mg by mouth 2 (two) times daily.  30 capsule  0  . ferrous sulfate 325 (65 FE) MG tablet Take 1 tablet (325 mg total) by mouth 3 (three) times daily after meals.  90 tablet  0  . guaiFENesin (ROBITUSSIN) 100 MG/5ML liquid Take 200 mg by mouth 3 (three) times daily as needed.      . methocarbamol (ROBAXIN) 500 MG tablet Take  1 tablet (500 mg total) by mouth every 6 (six) hours as needed.  40 tablet  0  . oxyCODONE (OXY IR/ROXICODONE) 5 MG immediate release tablet Take 1-2 tablets (5-10 mg total) by mouth every 3 (three) hours as needed.  60 tablet  0    REVIEW OF SYSTEMS:  A 15 point review of systems is documented in the electronic medical record. This was obtained by the nursing staff. However, I reviewed this with the patient to discuss relevant findings and make appropriate changes.  A  comprehensive review of systems was negative..  The patient completed an IPSS and IIEF questionnaire.  His IPSS score was 12 indicating moderate urinary outflow obstructive symptoms.  He indicated that his erectile function is almost never able to complete sexual activity.   PHYSICAL EXAM: This patient is in no acute distress.  He is alert and oriented.   vitals were not taken for this visit. He exhibits no respiratory distress or labored breathing.  He appears neurologically intact.  His mood is pleasant.  His affect is appropriate.  Please note the digital rectal exam findings described above.  LABORATORY DATA:  Lab Results  Component Value Date   WBC 8.5 09/29/2012   HGB 10.8* 09/29/2012   HCT 32.3* 09/29/2012   MCV 86.8 09/29/2012   PLT 146* 09/29/2012   Lab Results  Component Value Date   NA 135 09/27/2012   K 4.3 09/27/2012   CL 101 09/27/2012   CO2 28 09/27/2012   Lab Results  Component Value Date   ALT 20 09/20/2012   AST 22 09/20/2012   ALKPHOS 76 09/20/2012   BILITOT 0.7 09/20/2012     RADIOGRAPHY: Dg Chest 2 View  09/29/2012  *RADIOLOGY REPORT*  Clinical Data: Fever, cough, post total knee replacement, question pneumonia  CHEST - 2 VIEW  Comparison: 07/11/2012  Findings: Normal heart size, mediastinal contours, and pulmonary vascularity. Lungs clear. No pleural effusion or pneumothorax. Bones unremarkable.  IMPRESSION: No acute abnormalities.   Original Report Authenticated By: Ulyses Southward, M.D.       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  His T-Stage, Gleason's Score, and PSA put him into the intermediate risk group.  The primary Gleason's grade 4 would not be appropriately suited for prostate seed implant as monotherapy. Rather, this gentleman would potentially benefit from external radiation with or without seed implant boost in conjunction with  hormonal ablation.   PLAN:Today I reviewed the findings and workup thus far.  We discussed the natural history of prostate cancer.  We reviewed the the implications of T-stage, Gleason's Score, and PSA on decision-making and outcomes in prostate cancer.  We discussed radiation treatment in the management of prostate cancer with regard to the logistics and delivery of external beam radiation treatment as well as the logistics and delivery of prostate brachytherapy.  We compared and contrasted each of these approaches and also compared these against prostatectomy.  The patient expressed interest in external beam radiotherapy with seed implant boost.  I filled out a patient counseling form for him with relevant treatment diagrams and we retained a copy for our records.   The patient would like to continue recovering from knee replacement followed by external radiation for 25 treatments followed by seed implant.  I will share my findings with Dr. Vernie Ammons and move forward with scheduling placement of three gold fiducial  markers in mid February into the prostate to proceed with radiation.     I enjoyed meeting with him today, and will look forward to participating in the care of this very nice gentleman.   I spent 60 minutes face to face with the patient and more than 50% of that time was spent in counseling and/or coordination of care.   ------------------------------------------------  Artist Pais. Kathrynn Running, M.D.

## 2012-10-25 NOTE — Progress Notes (Signed)
Please see the Nurse Progress Note in the MD Initial Consult Encounter for this patient. 

## 2012-11-30 ENCOUNTER — Encounter: Payer: Self-pay | Admitting: Family Medicine

## 2012-12-01 ENCOUNTER — Encounter: Payer: Self-pay | Admitting: Radiation Oncology

## 2012-12-01 ENCOUNTER — Telehealth: Payer: Self-pay | Admitting: Radiation Oncology

## 2012-12-01 ENCOUNTER — Ambulatory Visit
Admission: RE | Admit: 2012-12-01 | Discharge: 2012-12-01 | Disposition: A | Discharge status: Home / Self Care | Payer: BC Managed Care – PPO | Source: Ambulatory Visit | Attending: Radiation Oncology | Admitting: Radiation Oncology

## 2012-12-01 DIAGNOSIS — Z510 Encounter for antineoplastic radiation therapy: Secondary | ICD-10-CM | POA: Insufficient documentation

## 2012-12-01 DIAGNOSIS — M25569 Pain in unspecified knee: Secondary | ICD-10-CM | POA: Insufficient documentation

## 2012-12-01 DIAGNOSIS — R350 Frequency of micturition: Secondary | ICD-10-CM | POA: Insufficient documentation

## 2012-12-01 DIAGNOSIS — C61 Malignant neoplasm of prostate: Secondary | ICD-10-CM | POA: Insufficient documentation

## 2012-12-01 DIAGNOSIS — R197 Diarrhea, unspecified: Secondary | ICD-10-CM | POA: Insufficient documentation

## 2012-12-01 NOTE — Progress Notes (Signed)
  Radiation Oncology         [phone] ________________________________  Name: ESTLE HUGULEY MRN: [id number]  Date: 12/01/2012  DOB: Jun 30, 1946  SIMULATION AND TREATMENT PLANNING NOTE  DIAGNOSIS:  67 y.o. 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  Identity was confirmed.  All relevant records and images related to the planned course of therapy were reviewed.  The patient freely provided informed written consent to proceed with treatment after reviewing the details related to the planned course of therapy. The consent form was witnessed and verified by the simulation staff.  Then, the patient was set-up in a stable reproducible  supine position for radiation therapy.  CT images were obtained.  Surface markings were placed.  The CT images were loaded into the planning software.  Then the target and avoidance structures were contoured.  Treatment planning then occurred.  The radiation prescription was entered and confirmed.  Then, I designed and supervised the construction of a total of 5 medically necessary complex treatment devices. One of the complex treatment devices included a vacuum LOC pillow bag create and immobilized manner for each radiation treatment. The other 4 complex treatment devices were multileaf collimator aperture as shaping radiation around the prostate and proximal seminal vesicles from the anterior, posterior, right lateral, and left lateral gantry positions. I have requested : 3D Simulation  I have requested a DVH of the following structures: Targets rectum bladder hips and small bowel.   The patient was then assessed for possible prostate seed implant.  Using three-dimensional radiation planning tools I reconstructed the prostate in view of the structures from the transperineal needle pathway to assess for possible pubic arch interference. In  doing so, I did not appreciate any pubic arch interference. Also, the patient's prostate volume was estimated based on the drawn structure. The volume was 31 cc.  Given the pubic arch appearance and prostate volume, patient remains a good candidate to proceed with prostate seed implant. Today, he freely provided informed written consent to proceed.    PLAN:  The patient will receive 45 Gy in 25 fractions followed by prostate seed implant boost.  ________________________________  Artist Pais. Kathrynn Running, M.D.

## 2012-12-01 NOTE — Telephone Encounter (Signed)
Met w patient to discuss RO billing. Pt had some financial concerns, due to out of work on work comp and wife gets SSI. Pt was given and EPP and MCD application to complete and return, as well as, ACS referral..  Dx: Prostate   Attending Rad: MM   Rad Tx: Daily

## 2012-12-07 ENCOUNTER — Encounter: Payer: Self-pay | Admitting: Radiation Oncology

## 2012-12-07 ENCOUNTER — Ambulatory Visit
Admission: RE | Admit: 2012-12-07 | Discharge: 2012-12-07 | Disposition: A | Discharge status: Home / Self Care | Payer: BC Managed Care – PPO | Source: Ambulatory Visit | Attending: Radiation Oncology | Admitting: Radiation Oncology

## 2012-12-08 ENCOUNTER — Ambulatory Visit: Payer: BC Managed Care – PPO | Admitting: Radiation Oncology

## 2012-12-11 ENCOUNTER — Ambulatory Visit
Admission: RE | Admit: 2012-12-11 | Discharge: 2012-12-11 | Disposition: A | Discharge status: Home / Self Care | Payer: BC Managed Care – PPO | Source: Ambulatory Visit | Attending: Radiation Oncology | Admitting: Radiation Oncology

## 2012-12-12 ENCOUNTER — Ambulatory Visit
Admission: RE | Admit: 2012-12-12 | Discharge: 2012-12-12 | Disposition: A | Discharge status: Home / Self Care | Payer: BC Managed Care – PPO | Source: Ambulatory Visit | Attending: Radiation Oncology | Admitting: Radiation Oncology

## 2012-12-13 ENCOUNTER — Ambulatory Visit: Payer: BC Managed Care – PPO

## 2012-12-13 ENCOUNTER — Telehealth: Payer: Self-pay | Admitting: *Deleted

## 2012-12-13 ENCOUNTER — Encounter: Payer: Self-pay | Admitting: Family Medicine

## 2012-12-13 ENCOUNTER — Ambulatory Visit (INDEPENDENT_AMBULATORY_CARE_PROVIDER_SITE_OTHER): Payer: BC Managed Care – PPO | Admitting: Family Medicine

## 2012-12-13 VITALS — BP 124/70 | HR 96 | Temp 100.5°F | Wt 245.5 lb

## 2012-12-13 DIAGNOSIS — J019 Acute sinusitis, unspecified: Secondary | ICD-10-CM

## 2012-12-13 MED ORDER — AMOXICILLIN-POT CLAVULANATE 875-125 MG PO TABS: 1.0000 | ORAL_TABLET | Freq: Two times a day (BID) | ORAL | Status: DC

## 2012-12-13 NOTE — Telephone Encounter (Signed)
May make him sleepy - if not to drive, ok to take during day.

## 2012-12-13 NOTE — Assessment & Plan Note (Signed)
Of 1-2 d duration. Currently undergoing radiation therapy for prostate cancer. Will treat aggressively with augmentin course. Red flags to return discussed. Lungs clear today.

## 2012-12-13 NOTE — Telephone Encounter (Signed)
Patient called cancelling his treatment today, on Lin[facility] 1, "I'm too sick have a cold or flu",going to see my Primary Md today at 1130,transferred call to linac one gave call to Lenorre,Therapist 7:56 AM

## 2012-12-13 NOTE — Telephone Encounter (Signed)
Patient notified. He will not drive and take med.

## 2012-12-13 NOTE — Patient Instructions (Signed)
You have a sinus infection. Take medicine as prescribed: augmentin twice daily for 10 days.  Take all 10 days. Push fluids and plenty of rest. Nasal saline irrigation or neti pot to help drain sinuses. May use simple mucinex with plenty of fluid to help mobilize mucous. May use hycodan cough syrup for cough at night. Let us know if fever >101.5, trouble opening/closing mouth, difficulty swallowing, or worsening - you may need to be seen again.

## 2012-12-13 NOTE — Progress Notes (Signed)
  Subjective:    Patient ID: Kevin Campbell, male    DOB: 06-May-1946, 67 y.o.   MRN: [id number]  HPI CC: cough  Seen here 09/04/2012 and treated with zpack and hycodan for bronchitis. Seen here 09/18/2012 and treated with augmentin for sinusitis.  Started external beam radiation this week for prostate cancer - no chemo.  Plan was to get seeds implanted after 25 radiation treatments Kathrynn Running).  Yesterday started feeling weak and ill.  Sudden onset of sxs.  Trouble sleeping 2/2 cough.  Dry cough. + body aches.  Tmax 100.5.  + chills and headaches.  + head congestion.  + PNdrainage.  No abd pain, ear or tooth pain, nausea.  No h/o asthma, COPD  Ex smoker. Grandson recently mildly ill over weekend. Did receive flu shot this season.   L TKR done 09/26/2012.  Some persistent trouble with mobility of knee.  Seeing therapist.  Past Medical History  Diagnosis Date  . Arthritis   . Seasonal allergies   . Colon polyps 2011    [name]  . BPH (benign prostatic hypertrophy)   . History of chicken pox   . GERD (gastroesophageal reflux disease)   . CAD (coronary artery disease) 2007    cath, nonobstructive  . Obesity   . Prediabetes   . URI (upper respiratory infection) 09-20-12    tx. Amoxicillin-is improved   . Depression   . Prostate cancer 08/24/12    Adenocarcinoma,gleason=3+4=7,& 3+3=6,PSA=5.75,volume=23cc(5/12)cores positive; treat with XRT and radioactive seed implant     Review of Systems Per HPI    Objective:   Physical Exam  Nursing note and vitals reviewed. Constitutional: He appears well-developed and well-nourished. No distress.  Tired appearing  HENT:  Head: Normocephalic and atraumatic.  Right Ear: Hearing, tympanic membrane, external ear and ear canal normal.  Left Ear: Hearing, tympanic membrane, external ear and ear canal normal.  Nose: No mucosal edema or rhinorrhea. Right sinus exhibits frontal sinus tenderness. Right sinus exhibits no maxillary sinus  tenderness. Left sinus exhibits frontal sinus tenderness. Left sinus exhibits no maxillary sinus tenderness.  Mouth/Throat: Uvula is midline, oropharynx is clear and moist and mucous membranes are normal. No oropharyngeal exudate, posterior oropharyngeal edema, posterior oropharyngeal erythema or tonsillar abscesses.  Eyes: Conjunctivae and EOM are normal. Pupils are equal, round, and reactive to light. No scleral icterus.  Neck: Normal range of motion. Neck supple. No thyromegaly present.  Cardiovascular: Normal rate, regular rhythm, normal heart sounds and intact distal pulses.   No murmur heard. Pulmonary/Chest: Effort normal and breath sounds normal. No respiratory distress. He has no wheezes. He has no rales.  clear  Lymphadenopathy:    He has no cervical adenopathy.  Skin: Skin is warm and dry. No rash noted.       Assessment & Plan:

## 2012-12-13 NOTE — Telephone Encounter (Signed)
Patient calling with a question about his Hycodan rx? Can he take it now or at bedtime? Please advise

## 2012-12-14 ENCOUNTER — Telehealth: Payer: Self-pay | Admitting: *Deleted

## 2012-12-14 ENCOUNTER — Ambulatory Visit: Payer: BC Managed Care – PPO

## 2012-12-14 NOTE — Telephone Encounter (Signed)
Called patient asking how he feeling, saw where he has a sinus infection, on Augmentin for 10 days, and hycodan cough syrup,,patient sounds better, he stated  He's feeling some better , drinking plenty water,and also taking mucinex otc,  Stated "depending on the whether tomorrow whether he can come in for rad tx, he lives in [place]" said he ould call if he cannot make it in ,thanked me for the call 3:41 PM

## 2012-12-15 ENCOUNTER — Ambulatory Visit: Payer: BC Managed Care – PPO

## 2012-12-18 ENCOUNTER — Other Ambulatory Visit: Payer: Self-pay

## 2012-12-18 ENCOUNTER — Ambulatory Visit
Admission: RE | Admit: 2012-12-18 | Discharge: 2012-12-18 | Disposition: A | Discharge status: Home / Self Care | Payer: BC Managed Care – PPO | Source: Ambulatory Visit | Attending: Radiation Oncology | Admitting: Radiation Oncology

## 2012-12-18 MED ORDER — HYDROCODONE-HOMATROPINE 5-1.5 MG/5ML PO SYRP: 5.0000 mL | ORAL_SOLUTION | Freq: Every evening | ORAL | Status: DC | PRN

## 2012-12-18 NOTE — Telephone Encounter (Signed)
Medication phoned to pharmacy.  

## 2012-12-18 NOTE — Telephone Encounter (Signed)
Pt still non productive cough on and off and request refill hycodan. No wheezing, no SOB, no fever and no chest pain. Robitussin does not control cough at night so pt can rest. Please advise. [facility] [address]

## 2012-12-18 NOTE — Telephone Encounter (Signed)
plz phone in and notify pt (tolerates hydrocodone in syrup ok despite allergy listed)

## 2012-12-19 ENCOUNTER — Ambulatory Visit
Admission: RE | Admit: 2012-12-19 | Discharge: 2012-12-19 | Disposition: A | Discharge status: Home / Self Care | Payer: BC Managed Care – PPO | Source: Ambulatory Visit | Attending: Radiation Oncology | Admitting: Radiation Oncology

## 2012-12-20 ENCOUNTER — Encounter: Payer: Self-pay | Admitting: Radiation Oncology

## 2012-12-20 ENCOUNTER — Ambulatory Visit
Admission: RE | Admit: 2012-12-20 | Discharge: 2012-12-20 | Disposition: A | Discharge status: Home / Self Care | Payer: BC Managed Care – PPO | Source: Ambulatory Visit | Attending: Radiation Oncology | Admitting: Radiation Oncology

## 2012-12-20 VITALS — BP 148/90 | HR 72 | Temp 98.9°F | Resp 20 | Wt 239.6 lb

## 2012-12-20 NOTE — Progress Notes (Addendum)
Patient here weekly rad txs, 5 completed so far Prostate, , patient education done,radiation therapy and you booklet,my business card, teach back givrn  Alert,oriented x3, no dysuria, sometimes hesitancy , not emptying bladder fully at times, regular bowels at present, pain in left knee 4 on 1-10 sclae, taking cough syrup and amoxicillin  For cold, has loose congestive cough,white pheglm at times 10:11 AM

## 2012-12-20 NOTE — Progress Notes (Signed)
  Radiation Oncology         [phone] ________________________________  Name: Kevin Campbell MRN: [id number]  Date: 12/20/2012  DOB: 1946/07/21  Weekly Radiation Therapy Management  Current Dose: 9 Gy     Planned Dose:  45 Gy plus boost  Narrative . . . . . . . . The patient presents for routine under treatment assessment.                                                   The patient is without complaint.                                 Set-up films were reviewed.                                 The chart was checked. Physical Findings. . .  weight is 239 lb 9.6 oz (108.682 kg). His oral temperature is 98.9 F (37.2 C). His blood pressure is 148/90 and his pulse is 72. His respiration is 20. . Weight essentially stable.  No significant changes. Impression . . . . . . . The patient is  tolerating radiation. Plan . . . . . . . . . . . . Continue treatment as planned.  ________________________________  Artist Pais. Kathrynn Running, M.D.

## 2012-12-21 ENCOUNTER — Ambulatory Visit
Admission: RE | Admit: 2012-12-21 | Discharge: 2012-12-21 | Disposition: A | Discharge status: Home / Self Care | Payer: BC Managed Care – PPO | Source: Ambulatory Visit | Attending: Radiation Oncology | Admitting: Radiation Oncology

## 2012-12-22 ENCOUNTER — Telehealth: Payer: Self-pay | Admitting: Family Medicine

## 2012-12-22 ENCOUNTER — Ambulatory Visit
Admission: RE | Admit: 2012-12-22 | Discharge: 2012-12-22 | Disposition: A | Discharge status: Home / Self Care | Payer: BC Managed Care – PPO | Source: Ambulatory Visit | Attending: Radiation Oncology | Admitting: Radiation Oncology

## 2012-12-22 MED ORDER — AMOXICILLIN-POT CLAVULANATE 875-125 MG PO TABS: 1.0000 | ORAL_TABLET | Freq: Two times a day (BID) | ORAL | Status: AC

## 2012-12-22 NOTE — Progress Notes (Signed)
Called [facility] asked Emily,therapist if patient was ready to come around to see MD, "he was good in the block was seen this week", patient was seen on 12/20/12 by MD,notified Dr,.Kathrynn Running, patient was sent home without coming back around, per block of 5,will need to be seen by Wed, Md will be off Will see on call Md 10:18 AM

## 2012-12-22 NOTE — Telephone Encounter (Signed)
Spoke with wife at her office visit today Pt has persistent cough, low grade fever.  Overall better than initially however.  No shortness of breath, no spiking fever. On last day of antibiotic. Will extend course for 5 days.   If not improved with this, advised needs to come in to be evaluated.

## 2012-12-25 ENCOUNTER — Ambulatory Visit
Admission: RE | Admit: 2012-12-25 | Discharge: 2012-12-25 | Disposition: A | Discharge status: Home / Self Care | Payer: BC Managed Care – PPO | Source: Ambulatory Visit | Attending: Radiation Oncology | Admitting: Radiation Oncology

## 2012-12-26 ENCOUNTER — Other Ambulatory Visit: Payer: Self-pay | Admitting: Urology

## 2012-12-26 ENCOUNTER — Encounter: Payer: Self-pay | Admitting: Radiation Oncology

## 2012-12-26 ENCOUNTER — Ambulatory Visit
Admission: RE | Admit: 2012-12-26 | Discharge: 2012-12-26 | Disposition: A | Discharge status: Home / Self Care | Payer: BC Managed Care – PPO | Source: Ambulatory Visit | Attending: Radiation Oncology | Admitting: Radiation Oncology

## 2012-12-26 VITALS — BP 155/93 | HR 81 | Temp 97.9°F | Resp 20 | Wt 245.0 lb

## 2012-12-26 NOTE — Progress Notes (Signed)
Patient here wekly rad tx prostate  9/25 completed,alert,oriented x3, no dysuria, nocturia x2, pain in left knee though, 4 on 1-10 scale, having arthoscopic left knee Thursday after rad tx, may not be in Friday, cnnot bend knee, bowel movments good,no c/o nausea 9:30 AM

## 2012-12-26 NOTE — Progress Notes (Signed)
Weekly Management Note Current Dose: 16.2  Gy  Projected Dose: 45 Gy   Narrative:  The patient presents for routine under treatment assessment.  CBCT/MVCT images/Port film x-rays were reviewed.  The chart was checked. Doing well. Knee arthroscopy on Thursday. May not have tx on Friday.   Physical Findings: Weight: 245 lb (111.131 kg). Alert. No distress.  Impression:  The patient is tolerating radiation.  Plan:  Continue treatment as planned. OK to miss Friday if necessary.

## 2012-12-27 ENCOUNTER — Ambulatory Visit
Admission: RE | Admit: 2012-12-27 | Discharge: 2012-12-27 | Disposition: A | Discharge status: Home / Self Care | Payer: BC Managed Care – PPO | Source: Ambulatory Visit | Attending: Radiation Oncology | Admitting: Radiation Oncology

## 2012-12-28 ENCOUNTER — Ambulatory Visit
Admission: RE | Admit: 2012-12-28 | Discharge: 2012-12-28 | Disposition: A | Discharge status: Home / Self Care | Payer: BC Managed Care – PPO | Source: Ambulatory Visit | Attending: Radiation Oncology | Admitting: Radiation Oncology

## 2012-12-29 ENCOUNTER — Ambulatory Visit: Payer: BC Managed Care – PPO

## 2013-01-01 ENCOUNTER — Ambulatory Visit
Admission: RE | Admit: 2013-01-01 | Discharge: 2013-01-01 | Disposition: A | Discharge status: Home / Self Care | Payer: BC Managed Care – PPO | Source: Ambulatory Visit | Attending: Radiation Oncology | Admitting: Radiation Oncology

## 2013-01-02 ENCOUNTER — Ambulatory Visit
Admission: RE | Admit: 2013-01-02 | Discharge: 2013-01-02 | Disposition: A | Discharge status: Home / Self Care | Payer: BC Managed Care – PPO | Source: Ambulatory Visit | Attending: Radiation Oncology | Admitting: Radiation Oncology

## 2013-01-03 ENCOUNTER — Encounter: Payer: Self-pay | Admitting: Radiation Oncology

## 2013-01-03 ENCOUNTER — Ambulatory Visit
Admission: RE | Admit: 2013-01-03 | Discharge: 2013-01-03 | Disposition: A | Discharge status: Home / Self Care | Payer: BC Managed Care – PPO | Source: Ambulatory Visit | Attending: Radiation Oncology | Admitting: Radiation Oncology

## 2013-01-03 VITALS — BP 140/83 | HR 81 | Temp 98.0°F | Resp 20 | Wt 244.5 lb

## 2013-01-03 DIAGNOSIS — C61 Malignant neoplasm of prostate: Secondary | ICD-10-CM

## 2013-01-03 MED ORDER — TAMSULOSIN HCL 0.4 MG PO CAPS: 0.4000 mg | ORAL_CAPSULE | Freq: Every day | ORAL | Status: DC

## 2013-01-03 NOTE — Progress Notes (Signed)
Patient here weekly rad tx prostate 14/25 completed, increased frequency at night voiding nocturia up to 4-5c instead of 2-3x, knee pain but better, had  Knee bent Thursday after patient was put to sleepp, has had physcial therapy Thursday,friday, Monday, Tuesday and all this week, up to 95%, and able to do bicycle, rwegular bm's, eating well, drinking plenty water 10:17 AM

## 2013-01-03 NOTE — Progress Notes (Signed)
  Radiation Oncology         [phone] ________________________________  Name: Kevin Campbell MRN: [id number]  Date: 01/03/2013  DOB: [birth year]  Weekly Radiation Therapy Management  Current Dose: 25.2 Gy     Planned Dose:  45 Gy plus boost  Narrative . . . . . . . . The patient presents for routine under treatment assessment.                                              Patient here weekly rad tx prostate 14/25 completed, increased frequency at night voiding nocturia up to 4-5c instead of 2-3x, knee pain but better, had Knee bent Thursday after patient was put to sleepp, has had physcial therapy Thursday,friday, Monday, Tuesday and all this week, up to 95%, and able to do bicycle, rwegular bm's, eating well, drinking plenty water                                 Set-up films were reviewed.                                 The chart was checked. Physical Findings. . .  weight is 244 lb 8 oz (110.904 kg). His oral temperature is 98 F (36.7 C). His blood pressure is 140/83 and his pulse is 81. His respiration is 20. . Weight essentially stable.  No significant changes. Impression . . . . . . . The patient is  tolerating radiation. Plan . . . . . . . . . . . . Continue treatment as planned.  Given Flomax 0.4 mg QHS  ________________________________  Artist Pais. Kathrynn Running, M.D.

## 2013-01-04 ENCOUNTER — Ambulatory Visit
Admission: RE | Admit: 2013-01-04 | Discharge: 2013-01-04 | Disposition: A | Discharge status: Home / Self Care | Payer: BC Managed Care – PPO | Source: Ambulatory Visit | Attending: Radiation Oncology | Admitting: Radiation Oncology

## 2013-01-05 ENCOUNTER — Ambulatory Visit
Admission: RE | Admit: 2013-01-05 | Discharge: 2013-01-05 | Disposition: A | Discharge status: Home / Self Care | Payer: BC Managed Care – PPO | Source: Ambulatory Visit | Attending: Radiation Oncology | Admitting: Radiation Oncology

## 2013-01-08 ENCOUNTER — Ambulatory Visit: Payer: BC Managed Care – PPO

## 2013-01-09 ENCOUNTER — Ambulatory Visit
Admission: RE | Admit: 2013-01-09 | Discharge: 2013-01-09 | Disposition: A | Discharge status: Home / Self Care | Payer: BC Managed Care – PPO | Source: Ambulatory Visit | Attending: Radiation Oncology | Admitting: Radiation Oncology

## 2013-01-09 DIAGNOSIS — C61 Malignant neoplasm of prostate: Secondary | ICD-10-CM

## 2013-01-09 NOTE — Progress Notes (Addendum)
Patient stopped me in the hall after tx, stating the flomax wasn't helping anymore,  Since last Friday, took a couple days was fine, now he is up all night, cannot sleep, takes flomax around 930pm, goes to bed normally at 10pm, but up by 230am and cannot sleep up to void several times, also asking a letter to be written about his dx, treatment here why he is being treated, wil stop by tomorrow after rad tx,  to see MD 1:29 PM

## 2013-01-10 ENCOUNTER — Ambulatory Visit
Admission: RE | Admit: 2013-01-10 | Discharge: 2013-01-10 | Disposition: A | Discharge status: Home / Self Care | Payer: BC Managed Care – PPO | Source: Ambulatory Visit | Attending: Radiation Oncology | Admitting: Radiation Oncology

## 2013-01-10 ENCOUNTER — Encounter: Payer: Self-pay | Admitting: Radiation Oncology

## 2013-01-10 VITALS — BP 138/79 | HR 79 | Temp 98.5°F | Resp 20 | Wt 242.2 lb

## 2013-01-10 DIAGNOSIS — R350 Frequency of micturition: Secondary | ICD-10-CM

## 2013-01-10 DIAGNOSIS — C61 Malignant neoplasm of prostate: Secondary | ICD-10-CM | POA: Insufficient documentation

## 2013-01-10 DIAGNOSIS — R300 Dysuria: Secondary | ICD-10-CM | POA: Insufficient documentation

## 2013-01-10 LAB — URINALYSIS, MICROSCOPIC - CHCC
Bilirubin (Urine): NEGATIVE
Blood: NEGATIVE
Glucose: NEGATIVE mg/dL
Specific Gravity, Urine: 1.025 (ref 1.003–1.035)
pH: 6 (ref 4.6–8.0)

## 2013-01-10 NOTE — Progress Notes (Signed)
  Radiation Oncology         [phone] ________________________________  Name: Kevin Campbell MRN: [id number]  Date: 01/10/2013  DOB: 05-09-1946  Weekly Radiation Therapy Management  Narrative . . . . . . . . The patient presents complaining of urinary frequency and nocturia.                                   Set-up films were reviewed.                                 The chart was checked. Physical Findings. . .  weight is 242 lb 3.2 oz (109.861 kg). His oral temperature is 98.5 F (36.9 C). His blood pressure is 138/79 and his pulse is 79. His respiration is 20. . Weight essentially stable.  No significant changes. Impression . . . . . . . The patient is tolerating radiation. Plan . . . . . . . . . . . . Continue treatment as planned.  Check UA and culture.  ________________________________  Artist Pais Kathrynn Running, M.D.

## 2013-01-10 NOTE — Progress Notes (Signed)
Patient here weekly rad tx prostate, 18 completed so far, but wants to see MD today , stating "flomax isn't helping anymore, up all night unable to sleep through the night, so he stopped taking this past Friday night, slight dysuria, bowels regular,  Needs a note,certification  Letter stating he is coming here for rad tx daily, pain 3-4 left knee 12:15 PM

## 2013-01-10 NOTE — Progress Notes (Signed)
Quick Note:  Please call patient with normal result.  Thanks. MM ______ 

## 2013-01-11 ENCOUNTER — Ambulatory Visit
Admission: RE | Admit: 2013-01-11 | Discharge: 2013-01-11 | Disposition: A | Discharge status: Home / Self Care | Payer: BC Managed Care – PPO | Source: Ambulatory Visit | Attending: Radiation Oncology | Admitting: Radiation Oncology

## 2013-01-11 NOTE — Progress Notes (Signed)
Patient given letter he requested, and informed him his U/A lab result was normal, patient thanked MD and staff for the letter and results of his lab work done yesterday 9:15 AM

## 2013-01-12 ENCOUNTER — Ambulatory Visit
Admission: RE | Admit: 2013-01-12 | Discharge: 2013-01-12 | Disposition: A | Discharge status: Home / Self Care | Payer: BC Managed Care – PPO | Source: Ambulatory Visit | Attending: Radiation Oncology | Admitting: Radiation Oncology

## 2013-01-12 ENCOUNTER — Ambulatory Visit: Payer: BC Managed Care – PPO

## 2013-01-12 DIAGNOSIS — C61 Malignant neoplasm of prostate: Secondary | ICD-10-CM

## 2013-01-12 LAB — URINE CULTURE

## 2013-01-12 NOTE — Progress Notes (Signed)
Quick Note:  Please call patient with normal result.  Thanks. MM ______ 

## 2013-01-12 NOTE — Progress Notes (Signed)
  Radiation Oncology         [phone] ________________________________  Name: Kevin Campbell MRN: [id number]  Date: 01/12/2013  DOB: 1946/04/21  Weekly Radiation Therapy Management  Current Dose: approved  Narrative . . . . . . . . The patient presents for routine under treatment assessment.                                               The patient is without complaint.  His UA and culture show no UTI                                    Nocturia persists                                 Set-up films were reviewed.                                 The chart was checked. Physical Findings. . . . Weight essentially stable.  No significant changes. Impression . . . . . . . The patient is  tolerating radiation. Plan . . . . . . . . . . . . Continue treatment as planned.  ________________________________  Artist Pais. Kathrynn Running, M.D.

## 2013-01-15 ENCOUNTER — Ambulatory Visit: Payer: BC Managed Care – PPO

## 2013-01-15 ENCOUNTER — Ambulatory Visit
Admission: RE | Admit: 2013-01-15 | Discharge: 2013-01-15 | Disposition: A | Discharge status: Home / Self Care | Payer: BC Managed Care – PPO | Source: Ambulatory Visit | Attending: Radiation Oncology | Admitting: Radiation Oncology

## 2013-01-16 ENCOUNTER — Ambulatory Visit
Admission: RE | Admit: 2013-01-16 | Discharge: 2013-01-16 | Disposition: A | Discharge status: Home / Self Care | Payer: BC Managed Care – PPO | Source: Ambulatory Visit | Attending: Radiation Oncology | Admitting: Radiation Oncology

## 2013-01-17 ENCOUNTER — Ambulatory Visit
Admission: RE | Admit: 2013-01-17 | Discharge: 2013-01-17 | Disposition: A | Discharge status: Home / Self Care | Payer: BC Managed Care – PPO | Source: Ambulatory Visit | Attending: Radiation Oncology | Admitting: Radiation Oncology

## 2013-01-18 ENCOUNTER — Ambulatory Visit
Admission: RE | Admit: 2013-01-18 | Discharge: 2013-01-18 | Disposition: A | Discharge status: Home / Self Care | Payer: BC Managed Care – PPO | Source: Ambulatory Visit | Attending: Radiation Oncology | Admitting: Radiation Oncology

## 2013-01-18 ENCOUNTER — Ambulatory Visit: Payer: BC Managed Care – PPO

## 2013-01-19 ENCOUNTER — Ambulatory Visit
Admission: RE | Admit: 2013-01-19 | Discharge: 2013-01-19 | Disposition: A | Discharge status: Home / Self Care | Payer: BC Managed Care – PPO | Source: Ambulatory Visit | Attending: Radiation Oncology | Admitting: Radiation Oncology

## 2013-01-19 ENCOUNTER — Encounter: Payer: Self-pay | Admitting: Radiation Oncology

## 2013-01-19 VITALS — BP 149/85 | HR 76 | Temp 99.0°F | Wt 241.3 lb

## 2013-01-19 DIAGNOSIS — C61 Malignant neoplasm of prostate: Secondary | ICD-10-CM

## 2013-01-19 NOTE — Progress Notes (Signed)
.   Radiation Oncology         [phone] ________________________________  Name: Kevin Campbell MRN: [id number]  Date: 12/07/2012  DOB: 1946/03/07  Simulation Verification Note  Status: outpatient  NARRATIVE: The patient was brought to the treatment unit and placed in the planned treatment position. The clinical setup was verified. Then port films were obtained and uploaded to the radiation oncology medical record software.  The treatment beams were carefully compared against the planned radiation fields. The position location and shape of the radiation fields was reviewed. They targeted volume of tissue appears to be appropriately covered by the radiation beams. Organs at risk appear to be excluded as planned.  Based on my personal review, I approved the simulation verification. The patient's treatment will proceed as planned.  ------------------------------------------------  Artist Pais Kathrynn Running, M.D.

## 2013-01-19 NOTE — Progress Notes (Signed)
Patient here for routine weekly assessment of prostate treatment.Has pain of left knee replacement done in December 2013.Occasional frequency and urgency.Bowels fine.Had a few episodes of diarrhea.Patient completed 25 of 25 treatments today.Prostated implant scheduled Feb 16, 2013 at 10 am.Shirley to see today.

## 2013-01-19 NOTE — Progress Notes (Signed)
  Radiation Oncology         [phone] ________________________________  Name: Kevin Campbell MRN: [id number]  Date: 01/19/2013  DOB: [birth year]  End of Treatment Note  Diagnosis:   67 y.o. 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 Prior to each treatment, patient underwent cone beam CT to align the 3 gold AccuLoc markers within his prostate to the planned position.  Narrative: The patient tolerated radiation treatment relatively well.   He experienced modest increases in urinary frequency. Otherwise, he was without complaint.  Plan: The patient has completed radiation treatment. Patient completed 25 of 25 treatments today. Prostate seed implant boost scheduled Feb 16, 2013 at 10 am.   I advised him to call or return sooner if he has any questions or concerns related to his recovery or treatment. ________________________________  Artist Pais. Kathrynn Running, M.D.

## 2013-01-21 NOTE — Progress Notes (Signed)
  Radiation Oncology         [phone] ________________________________  Name: Kevin Campbell MRN: [id number]  Date: 01/19/2013  DOB: [birth year]  Weekly Radiation Therapy Management  Current Dose: 45 Gy     Planned Dose:  45 Gy plus seed implant  Narrative . . . . . . . . The patient presents for routine under treatment assessment. Patient here for routine weekly assessment of prostate treatment.Has pain of left knee replacement done in December 2013.Occasional frequency and urgency.Bowels fine.Had a few episodes of diarrhea.[place] to see today.                                 Set-up films were reviewed.                                 The chart was checked. Physical Findings. . .  weight is 241 lb 4.8 oz (109.453 kg). His temperature is 99 F (37.2 C). His blood pressure is 149/85 and his pulse is 76. His oxygen saturation is 97%. . Weight essentially stable.  No significant changes. Impression . . . . . . . The patient is  tolerating radiation. Plan . . . . . . . . . . . . Patient completed 25 of 25 treatments today.            Prostate seed implant boost scheduled Feb 16, 2013 at 10 am  ________________________________  Artist Pais. Kathrynn Running, M.D.

## 2013-02-07 IMAGING — CR DG CHEST 2V
2 series · 2 of 2 positions shown · non-contrast
Comparison: 07/11/2012

CLINICAL DATA: Fever, cough, post total knee replacement, question
pneumonia

CHEST - 2 VIEW

[w chest lat *]
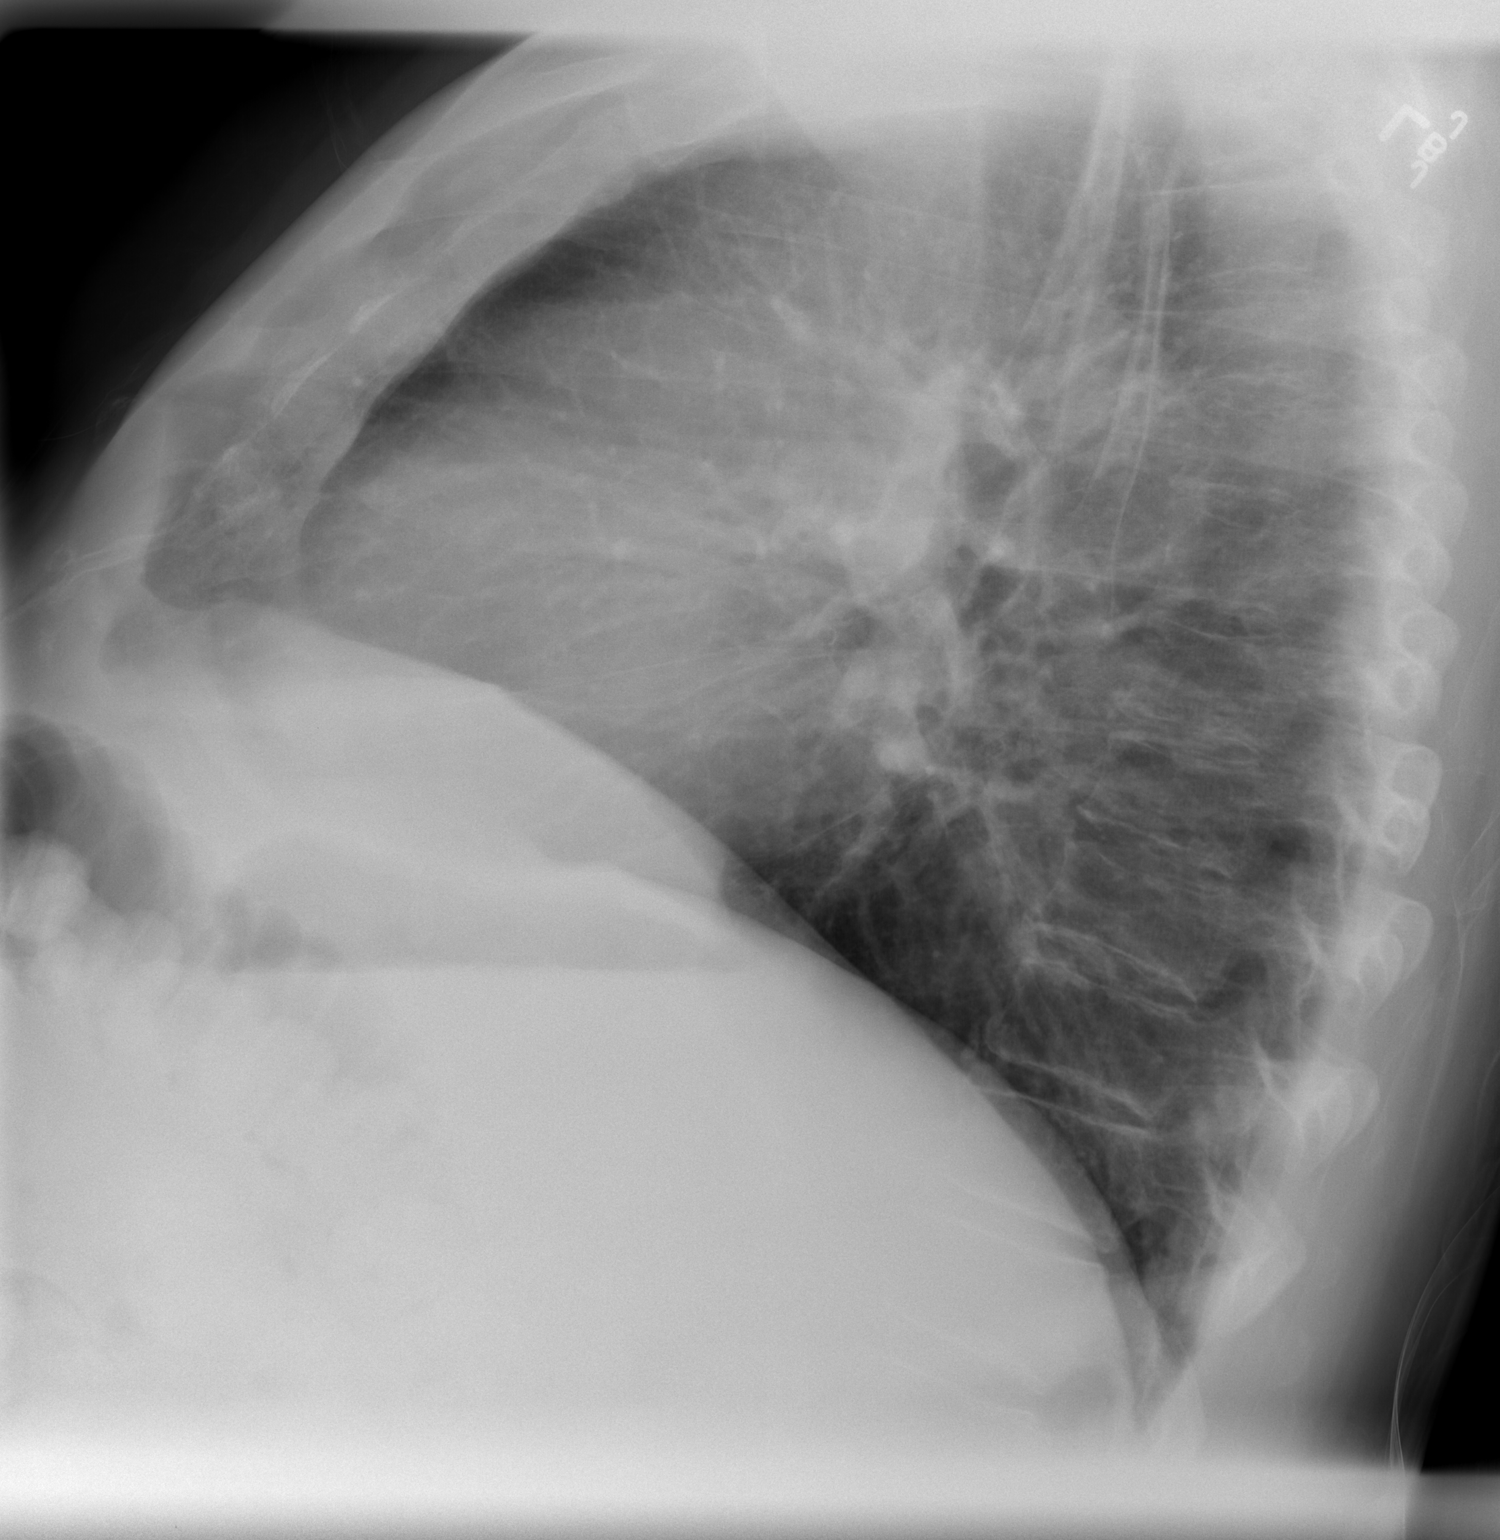

[view not recorded]
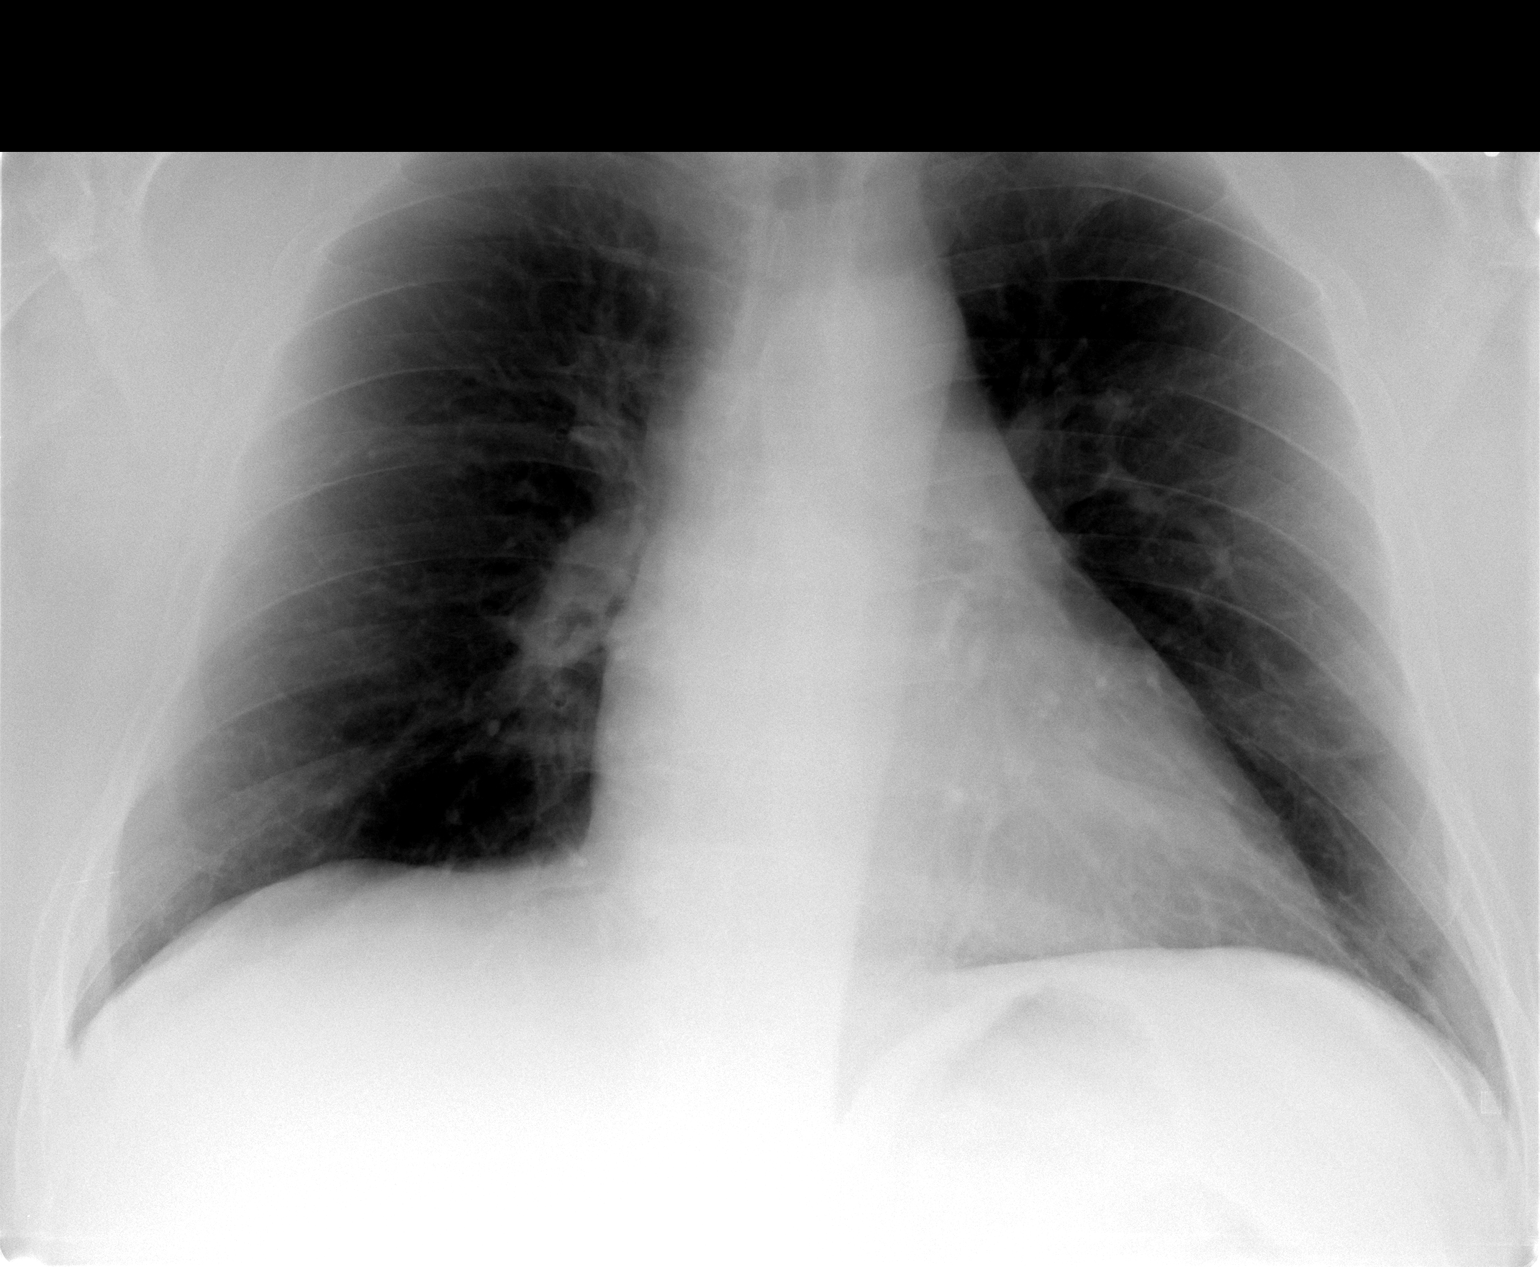

[2 of 2 positions shown; findings below may reference images not displayed]

FINDINGS: Normal heart size, mediastinal contours, and pulmonary vascularity.
Lungs clear.
No pleural effusion or pneumothorax.
Bones unremarkable.
IMPRESSION: No acute abnormalities.

## 2013-02-08 ENCOUNTER — Telehealth: Payer: Self-pay | Admitting: *Deleted

## 2013-02-08 NOTE — Telephone Encounter (Signed)
CALLED PATIENT TO REMIND OF APPT., LVM FOR A RETURN CALL 

## 2013-02-09 LAB — COMPREHENSIVE METABOLIC PANEL
ALT: 13 U/L (ref 0–53)
AST: 17 U/L (ref 0–37)
Albumin: 3.8 g/dL (ref 3.5–5.2)
Alkaline Phosphatase: 72 U/L (ref 39–117)
BUN: 13 mg/dL (ref 6–23)
CO2: 25 mEq/L (ref 19–32)
Calcium: 9.8 mg/dL (ref 8.4–10.5)
Chloride: 103 mEq/L (ref 96–112)
Creatinine, Ser: 1.09 mg/dL (ref 0.50–1.35)
GFR calc Af Amer: 81 mL/min — ABNORMAL LOW (ref >90)
GFR calc non Af Amer: 70 mL/min — ABNORMAL LOW (ref >90)
Glucose, Bld: 105 mg/dL — ABNORMAL HIGH (ref 70–99)
Potassium: 4.1 mEq/L (ref 3.5–5.1)
Sodium: 139 mEq/L (ref 135–145)
Total Bilirubin: 0.6 mg/dL (ref 0.3–1.2)
Total Protein: 7.4 g/dL (ref 6.0–8.3)

## 2013-02-09 LAB — APTT: aPTT: 33 seconds (ref 24–37)

## 2013-02-09 LAB — PROTIME-INR
INR: 0.98 (ref 0.00–1.49)
Prothrombin Time: 12.9 seconds (ref 11.6–15.2)

## 2013-02-09 LAB — CBC
HCT: 39.4 % (ref 39.0–52.0)
Hemoglobin: 13.6 g/dL (ref 13.0–17.0)
MCH: 28.9 pg (ref 26.0–34.0)
MCHC: 34.5 g/dL (ref 30.0–36.0)
MCV: 83.7 fL (ref 78.0–100.0)
Platelets: 200 10*3/uL (ref 150–400)
RBC: 4.71 MIL/uL (ref 4.22–5.81)
RDW: 13.4 % (ref 11.5–15.5)
WBC: 6.7 10*3/uL (ref 4.0–10.5)

## 2013-02-13 ENCOUNTER — Encounter (HOSPITAL_BASED_OUTPATIENT_CLINIC_OR_DEPARTMENT_OTHER): Payer: Self-pay | Admitting: *Deleted

## 2013-02-13 NOTE — Progress Notes (Signed)
NPO AFTER MN. ARRIVES AT 0845. CURRENT EKG, CXR, AND LAB RESULTS IN EPIC AND CHART. WILL DO FLEET ENEMA AM OF SURG. MAY TAKE ONE RX PAIN IF NEEDED W/ SIPS OF WATER. S/P TOTAL LEFT KNEE DEC 2013.

## 2013-02-15 ENCOUNTER — Telehealth: Payer: Self-pay | Admitting: *Deleted

## 2013-02-15 NOTE — H&P (Signed)
History of Present Illness     Kevin Campbell is a 67 year old male with newly diagnosed prostate cancer.              Adenocarcinoma the prostate: He was found to have a PSA of 5.78/13% in 10/13. His PSA in 4 months prior to that was 4.58 and 8 months prior was 5.78. He said he had a UTI in the 1980s but has never had a UTI or prostatitis since that time. He was noted on DRE to have some slight induration at the apex on the left.  TRUS/Bx(08/24/12): Vol. 23 cc Pathology: Adenocarcinoma Gleason score 4+3 = 7 in 2 cores, 3+4 = 7 in 2 cores and 3+3 = 6 in 1 core all from the left lobe (5/12 cores positive).  Interval Hx: No new complaints are noted.   Past Medical History Problems  1. History of  Arthritis V13.4 2. History of  Depression 311 3. History of  Esophageal Reflux 530.81  Surgical History Problems  1. History of  Claviculectomy Left 2. History of  Knee Surgery Left 3. History of  Shoulder Surgery Right  Current Meds 1. Meloxicam 7.5 MG Oral Tablet; Therapy: 17Oct2013 to 2. Vitamin D3 2000 UNIT Oral Tablet; Therapy: (Recorded:29Oct2013) to  Family History Problems  1. Family history of  Breast Cancer V16.3 2. Family history of  Heart Disease V17.49 3. Family history of  Lung Cancer V16.1  Social History Problems  1. Caffeine Use 2. Cigars (___ A Day) 305.1 2 a day 5 years quit 24 years 3. Marital History - Currently Married Denied  4. History of  Alcohol Use  Review of Systems Genitourinary, constitutional, skin, eye, otolaryngeal, hematologic/lymphatic, cardiovascular, pulmonary, endocrine, musculoskeletal, gastrointestinal, neurological and psychiatric system(s) were reviewed and pertinent findings if present are noted.  Genitourinary: urinary frequency and nocturia.  Constitutional: recent weight loss.  ENT: sinus problems.  Hematologic/Lymphatic: a tendency to easily bruise.  Cardiovascular: leg swelling.  Musculoskeletal: joint pain.  Psychiatric:  depression.    Vitals Vital Signs  BMI Calculated: 37.98 BSA Calculated: 2.19 Height: 5 ft 7 in Weight: 242 lb  Blood Pressure: 155 / 92 Heart Rate: 74  Physical Exam Constitutional: Well nourished and well developed . No acute distress.  ENT:. The ears and nose are normal in appearance.  Neck: The appearance of the neck is normal and no neck mass is present.  Pulmonary: No respiratory distress and normal respiratory rhythm and effort.  Cardiovascular: Heart rate and rhythm are normal . No peripheral edema.  Abdomen: The abdomen is mildly obese. The abdomen is soft and nontender. No masses are palpated. No CVA tenderness. No hernias are palpable. No hepatosplenomegaly noted.  Rectal: Rectal exam demonstrates normal sphincter tone, no tenderness and no masses. The prostate has no nodularity, is indurated involving the left, apex of the prostate and is not tender. The left seminal vesicle is nonpalpable. The right seminal vesicle is nonpalpable. The perineum is normal on inspection.  Genitourinary: Examination of the penis demonstrates no discharge, no masses, no lesions and a normal meatus. The penis is uncircumcised. The scrotum is without lesions. The right epididymis is palpably normal and non-tender. The left epididymis is palpably normal and non-tender. The right testis is non-tender and without masses. The left testis is non-tender and without masses.  Lymphatics: The femoral and inguinal nodes are not enlarged or tender.  Skin: Normal skin turgor, no visible rash and no visible skin lesions.  Neuro/Psych:. Mood and affect are appropriate.  Plan Adenocarcinoma Of The Prostate Gland (185)  1. Lupron Depot 45 MG Intramuscular Kit; INJECT 45  MG Intramuscular; To Be Done: 05Dec2013;  Status: HOLD FOR - Administration 2. Radiation Oncology Referral Referral  Referral  Requested for: 05Dec2013 Health Maintenance (V70.0)  3. UA With REFLEX  Done: 05Dec2013  03:41PM  Discussion/Summary       AU Prostate Ca Discussion:  The patient was counseled about the natural history of prostate cancer and the standard treatment options that are available for prostate cancer. It was explained to him how his age and life expectancy, clinical stage, Gleason score, and PSA affect his prognosis, the decision to proceed with additional staging studies, as well as how that information influences recommended treatment strategies. We discussed the roles for active surveillance, radiation therapy, surgical therapy, androgen deprivation, as well as ablative therapy options for the treatment of prostate cancer as appropriate to his individual cancer situation. We discussed the risks and benefits of these options with regard to their impact on cancer control and also in terms of potential adverse events, complications, and impact on quiality of life particularly related to urinary, bowel, and sexual function. The patient was encouraged to ask questions throughout the discussion today and all questions were answered to his stated satisfaction. In addition, the patient was provided with and/or directed to appropriate resources and literature for further education about prostate cancer and treatment options.   After discussing the treatment options available he indicated that he wanted to proceed with radiation therapy. We discussed IMRT versus radiation plus seed boost. We will over the duration of the treatments. I also have discussed the fact that with Gleason 4+3 present he would likely benefit from ADT.  1. He received neoadjuvant Lupron therapy. 2. He underwent XRT. 3. He presents for radioactive seed boost.

## 2013-02-15 NOTE — Telephone Encounter (Signed)
Called patient to remind of procedure for 02-16-13, spoke with patient and he is aware of this procedure.

## 2013-02-16 ENCOUNTER — Other Ambulatory Visit: Payer: Self-pay

## 2013-02-16 ENCOUNTER — Ambulatory Visit (HOSPITAL_BASED_OUTPATIENT_CLINIC_OR_DEPARTMENT_OTHER)
Admission: RE | Admit: 2013-02-16 | Discharge: 2013-02-16 | Disposition: A | Discharge status: Home / Self Care | Payer: BC Managed Care – PPO | Source: Ambulatory Visit | Attending: Urology | Admitting: Urology

## 2013-02-16 ENCOUNTER — Ambulatory Visit (HOSPITAL_COMMUNITY): Payer: BC Managed Care – PPO

## 2013-02-16 ENCOUNTER — Encounter (HOSPITAL_BASED_OUTPATIENT_CLINIC_OR_DEPARTMENT_OTHER): Payer: Self-pay | Admitting: Anesthesiology

## 2013-02-16 ENCOUNTER — Ambulatory Visit (HOSPITAL_BASED_OUTPATIENT_CLINIC_OR_DEPARTMENT_OTHER): Payer: BC Managed Care – PPO | Admitting: Anesthesiology

## 2013-02-16 ENCOUNTER — Encounter (HOSPITAL_BASED_OUTPATIENT_CLINIC_OR_DEPARTMENT_OTHER)
Admission: RE | Disposition: A | Discharge status: Home / Self Care | Payer: Self-pay | Source: Ambulatory Visit | Attending: Urology

## 2013-02-16 ENCOUNTER — Encounter (HOSPITAL_BASED_OUTPATIENT_CLINIC_OR_DEPARTMENT_OTHER): Payer: Self-pay | Admitting: *Deleted

## 2013-02-16 DIAGNOSIS — Z79899 Other long term (current) drug therapy: Secondary | ICD-10-CM | POA: Insufficient documentation

## 2013-02-16 DIAGNOSIS — K219 Gastro-esophageal reflux disease without esophagitis: Secondary | ICD-10-CM | POA: Insufficient documentation

## 2013-02-16 DIAGNOSIS — C61 Malignant neoplasm of prostate: Secondary | ICD-10-CM | POA: Insufficient documentation

## 2013-02-16 HISTORY — DX: Nocturia: R35.1

## 2013-02-16 HISTORY — PX: RADIOACTIVE SEED IMPLANT: SHX5150

## 2013-02-16 HISTORY — DX: Carpal tunnel syndrome, bilateral upper limbs: G56.03

## 2013-02-16 SURGERY — INSERTION, RADIATION SOURCE, PROSTATE
Anesthesia: General | Site: Prostate | Wound class: Clean Contaminated

## 2013-02-16 MED ORDER — FLEET ENEMA 7-19 GM/118ML RE ENEM
1.0000 | ENEMA | Freq: Once | RECTAL | Status: DC
  Filled 2013-02-16: qty 1

## 2013-02-16 MED ORDER — FENTANYL CITRATE 0.05 MG/ML IJ SOLN
INTRAMUSCULAR | Status: DC | PRN
  Administered 2013-02-16: 50 ug via INTRAVENOUS
  Administered 2013-02-16 (×4): 25 ug via INTRAVENOUS
  Administered 2013-02-16: 50 ug via INTRAVENOUS
  Administered 2013-02-16 (×2): 25 ug via INTRAVENOUS

## 2013-02-16 MED ORDER — CIPROFLOXACIN IN D5W 400 MG/200ML IV SOLN
400.0000 mg | INTRAVENOUS | Status: AC
  Administered 2013-02-16: 400 mg via INTRAVENOUS
  Filled 2013-02-16: qty 200

## 2013-02-16 MED ORDER — HYDROCODONE-ACETAMINOPHEN 10-325 MG PO TABS: 1.0000 | ORAL_TABLET | Freq: Four times a day (QID) | ORAL | Status: DC | PRN

## 2013-02-16 MED ORDER — ACETAMINOPHEN 10 MG/ML IV SOLN
INTRAVENOUS | Status: DC | PRN
  Administered 2013-02-16: 1000 mg via INTRAVENOUS

## 2013-02-16 MED ORDER — LACTATED RINGERS IV SOLN
INTRAVENOUS | Status: DC
  Filled 2013-02-16: qty 1000

## 2013-02-16 MED ORDER — DEXAMETHASONE SODIUM PHOSPHATE 4 MG/ML IJ SOLN
INTRAMUSCULAR | Status: DC | PRN
  Administered 2013-02-16: 4 mg via INTRAVENOUS

## 2013-02-16 MED ORDER — PROPOFOL 10 MG/ML IV BOLUS
INTRAVENOUS | Status: DC | PRN
  Administered 2013-02-16: 50 mg via INTRAVENOUS
  Administered 2013-02-16: 200 mg via INTRAVENOUS

## 2013-02-16 MED ORDER — LACTATED RINGERS IV SOLN
INTRAVENOUS | Status: DC
  Administered 2013-02-16 (×2): via INTRAVENOUS
  Administered 2013-02-16: 100 mL/h via INTRAVENOUS
  Administered 2013-02-16: 12:00:00 via INTRAVENOUS
  Filled 2013-02-16: qty 1000

## 2013-02-16 MED ORDER — EPHEDRINE SULFATE 50 MG/ML IJ SOLN
INTRAMUSCULAR | Status: DC | PRN
  Administered 2013-02-16: 10 mg via INTRAVENOUS

## 2013-02-16 MED ORDER — STERILE WATER FOR IRRIGATION IR SOLN
Status: DC | PRN
  Administered 2013-02-16: 3000 mL via INTRAVESICAL

## 2013-02-16 MED ORDER — FENTANYL CITRATE 0.05 MG/ML IJ SOLN
25.0000 ug | INTRAMUSCULAR | Status: DC | PRN
  Administered 2013-02-16: 0.25 ug via INTRAVENOUS
  Filled 2013-02-16: qty 1

## 2013-02-16 MED ORDER — PROMETHAZINE HCL 25 MG/ML IJ SOLN
6.2500 mg | INTRAMUSCULAR | Status: DC | PRN
  Filled 2013-02-16: qty 1

## 2013-02-16 MED ORDER — LIDOCAINE HCL (CARDIAC) 20 MG/ML IV SOLN
INTRAVENOUS | Status: DC | PRN
  Administered 2013-02-16: 50 mg via INTRAVENOUS

## 2013-02-16 MED ORDER — CIPROFLOXACIN HCL 500 MG PO TABS: 500.0000 mg | ORAL_TABLET | Freq: Two times a day (BID) | ORAL | Status: DC

## 2013-02-16 MED ORDER — IOHEXOL 350 MG/ML SOLN
INTRAVENOUS | Status: DC | PRN
  Administered 2013-02-16: 7 mL

## 2013-02-16 MED ORDER — STERILE WATER FOR IRRIGATION IR SOLN
Status: DC | PRN
  Administered 2013-02-16: 1000 mL

## 2013-02-16 SURGICAL SUPPLY — 25 items
BAG URINE DRAINAGE (UROLOGICAL SUPPLIES) ×2 IMPLANT
BLADE SURG ROTATE 9660 (MISCELLANEOUS) ×2 IMPLANT
CATH FOLEY 2WAY SLVR  5CC 16FR (CATHETERS) ×2
CATH FOLEY 2WAY SLVR 5CC 16FR (CATHETERS) ×2 IMPLANT
CATH ROBINSON RED A/P 20FR (CATHETERS) ×2 IMPLANT
CLOTH BEACON ORANGE TIMEOUT ST (SAFETY) ×2 IMPLANT
COVER MAYO STAND STRL (DRAPES) ×2 IMPLANT
COVER TABLE BACK 60X90 (DRAPES) ×2 IMPLANT
DRSG TEGADERM 4X4.75 (GAUZE/BANDAGES/DRESSINGS) ×2 IMPLANT
DRSG TEGADERM 8X12 (GAUZE/BANDAGES/DRESSINGS) ×2 IMPLANT
GAUZE SPONGE 4X4 12PLY STRL LF (GAUZE/BANDAGES/DRESSINGS) ×1 IMPLANT
GLOVE BIO SURGEON STRL SZ7.5 (GLOVE) IMPLANT
GLOVE BIO SURGEON STRL SZ8 (GLOVE) ×7 IMPLANT
GLOVE ECLIPSE 8.0 STRL XLNG CF (GLOVE) IMPLANT
GOWN STRL REIN XL XLG (GOWN DISPOSABLE) ×2 IMPLANT
GOWN XL W/COTTON TOWEL STD (GOWNS) ×2 IMPLANT
HOLDER FOLEY CATH W/STRAP (MISCELLANEOUS) ×2 IMPLANT
IV NS IRRIG 3000ML ARTHROMATIC (IV SOLUTION) IMPLANT
IV WATER IRR. 1000ML (IV SOLUTION) ×2 IMPLANT
PACK CYSTOSCOPY (CUSTOM PROCEDURE TRAY) ×2 IMPLANT
SYRINGE 10CC LL (SYRINGE) ×2 IMPLANT
UNDERPAD 30X30 INCONTINENT (UNDERPADS AND DIAPERS) ×4 IMPLANT
WATER STERILE IRR 3000ML UROMA (IV SOLUTION) IMPLANT
WATER STERILE IRR 500ML POUR (IV SOLUTION) ×2 IMPLANT
radioactive seeds ×1 IMPLANT

## 2013-02-16 NOTE — Progress Notes (Signed)
Dr Vernie Ammons notified of pt's documented hx of allergy to hydrocodone. Dr Vernie Ammons indicates pt only has dizziness w hydrocodone & can take med or take tylenol or motrin to substitute hydrocodone if pt doesn't want to take hydrocodone.

## 2013-02-16 NOTE — Anesthesia Procedure Notes (Signed)
Procedure Name: LMA Insertion Date/Time: 02/16/2013 10:16 AM Performed by: Fran Lowes Pre-anesthesia Checklist: Patient identified, Emergency Drugs available, Suction available and Patient being monitored Patient Re-evaluated:Patient Re-evaluated prior to inductionOxygen Delivery Method: Circle System Utilized Preoxygenation: Pre-oxygenation with 100% oxygen Intubation Type: IV induction Ventilation: Mask ventilation without difficulty LMA: LMA inserted LMA Size: 4.0 Number of attempts: 1 Airway Equipment and Method: bite block Placement Confirmation: positive ETCO2 Tube secured with: Tape Dental Injury: Teeth and Oropharynx as per pre-operative assessment

## 2013-02-16 NOTE — Interval H&P Note (Signed)
History and Physical Interval Note:  02/16/2013 9:37 AM  Kevin Campbell  has presented today for surgery, with the diagnosis of prostate cancer   The various methods of treatment have been discussed with the patient and family. After consideration of risks, benefits and other options for treatment, the patient has consented to  Procedure(s): RADIOACTIVE PROSTATE SEED IMPLANT (N/A) as a surgical intervention .  The patient's history has been reviewed, patient examined, no change in status, stable for surgery.  I have reviewed the patient's chart and labs.  Questions were answered to the patient's satisfaction.     [place]

## 2013-02-16 NOTE — Transfer of Care (Signed)
Immediate Anesthesia Transfer of Care Note  Patient: Kevin Campbell  Procedure(s) Performed: Procedure(s) (LRB): RADIOACTIVE PROSTATE SEED IMPLANT (N/A)  Patient Location: Patient transported to PACU with oxygen via face mask at 4 Liters / Min  Anesthesia Type: General  Level of Consciousness: awake and alert   Airway & Oxygen Therapy: Patient Spontanous Breathing and Patient connected to face mask oxygen  Post-op Assessment: Report given to PACU RN and Post -op Vital signs reviewed and stable  Post vital signs: Reviewed and stable  Dentition: Teeth and oropharynx remain in pre-op condition  Complications: No apparent anesthesia complications

## 2013-02-16 NOTE — Anesthesia Preprocedure Evaluation (Addendum)
Anesthesia Evaluation  Patient identified by MRN, date of birth, ID band Patient awake    Reviewed: Allergy & Precautions, H&P , NPO status , Patient's Chart, lab work & pertinent test results  Airway Mallampati: II TM Distance: >3 FB Neck ROM: Full    Dental  (+) Dental Advisory Given, Edentulous Upper and Edentulous Lower   Pulmonary former smoker,  breath sounds clear to auscultation  Pulmonary exam normal       Cardiovascular - CAD Rhythm:Regular Rate:Normal     Neuro/Psych  Neuromuscular disease negative neurological ROS  negative psych ROS   GI/Hepatic Neg liver ROS, GERD-  Medicated,  Endo/Other  Morbid obesity  Renal/GU negative Renal ROS     Musculoskeletal negative musculoskeletal ROS (+)   Abdominal   Peds  Hematology negative hematology ROS (+)   Anesthesia Other Findings   Reproductive/Obstetrics                          Anesthesia Physical Anesthesia Plan  ASA: II  Anesthesia Plan: General   Post-op Pain Management:    Induction: Intravenous  Airway Management Planned: LMA  Additional Equipment:   Intra-op Plan:   Post-operative Plan: Extubation in OR  Informed Consent: I have reviewed the patients History and Physical, chart, labs and discussed the procedure including the risks, benefits and alternatives for the proposed anesthesia with the patient or authorized representative who has indicated his/her understanding and acceptance.   Dental advisory given  Plan Discussed with: CRNA  Anesthesia Plan Comments:       Anesthesia Quick Evaluation

## 2013-02-16 NOTE — Anesthesia Postprocedure Evaluation (Signed)
  Anesthesia Post-op Note  Patient: Kevin Campbell  Procedure(s) Performed: Procedure(s) (LRB): RADIOACTIVE PROSTATE SEED IMPLANT (N/A)  Patient Location: PACU  Anesthesia Type: General  Level of Consciousness: awake and alert   Airway and Oxygen Therapy: Patient Spontanous Breathing  Post-op Pain: mild  Post-op Assessment: Post-op Vital signs reviewed, Patient's Cardiovascular Status Stable, Respiratory Function Stable, Patent Airway and No signs of Nausea or vomiting  Last Vitals:  Filed Vitals:   02/16/13 1245  BP: 140/85  Pulse: 80  Temp:   Resp: 11    Post-op Vital Signs: stable   Complications: No apparent anesthesia complications

## 2013-02-16 NOTE — Op Note (Signed)
PATIENT:  [name]  PRE-OPERATIVE DIAGNOSIS:  Adenocarcinoma of the prostate  POST-OPERATIVE DIAGNOSIS:  Same  PROCEDURE:  Procedure(s): 1. I-125 radioactive seed implantation 2. Cystoscopy  SURGEON:  Surgeon(s): Garnett Farm  Radiation oncologist: Dr. Margaretmary Dys  ANESTHESIA:  General  EBL:  Minimal  DRAINS: 16 French Foley catheter  INDICATION: Kevin Campbell is a 67 year old male with biopsy-proven adenocarcinoma of the prostate. His biopsy was positive for Gleason 4+3 = 7 in 2 cores, 3+4 = 7 in 2 cores and 3+3 = 6 in 1 core all from the left lobe with a total of 5/12 cores positive. He elected to proceed with radiation and therefore underwent external beam radiation therapy and presents today for radioactive seed boost.  Description of procedure: After informed consent the patient was brought to the major OR, placed on the table and administered general anesthesia. He was then moved to the modified lithotomy position with his perineum perpendicular to the floor. His perineum and genitalia were then sterilely prepped. An official timeout was then performed. A 16 French Foley catheter was then placed in the bladder and filled with dilute contrast, a rectal tube was placed in the rectum and the transrectal ultrasound probe was placed in the rectum and affixed to the stand. He was then sterilely draped.  Real time ultrasonography was used along with the seed planning software spot-pro version 3.1-00. This was used to develop the seed plan including the number of needles as well as number of seeds required for complete and adequate coverage. Real-time ultrasonography was then used along with the previously developed plan and the Nucletron device to implant a total of 51 seeds using 18 needles. This proceeded without difficulty or complication.  A Foley catheter was then removed as well as the transrectal ultrasound probe and rectal probe. Flexible cystoscopy was then  performed using the 17 French flexible scope which revealed a normal urethra throughout its length down to the sphincter which appeared intact. The prostatic urethra revealed bilobar hypertrophy but no evidence of obstruction, seeds, spacers or lesions. The bladder was then entered and fully and systematically inspected. The ureteral orifices were noted to be of normal configuration and position. The mucosa revealed no evidence of tumors. There were also no stones identified within the bladder. I noted no seeds or spacers on the floor of the bladder and retroflexion of the scope revealed no seeds protruding from the base of the prostate.  The cystoscope was then removed and a new 16 French Foley catheter was then inserted and the balloon was filled with 10 cc of sterile water. This was connected to closed system drainage and the patient was awakened and taken to recovery room in stable and satisfactory condition. He tolerated procedure well and there were no intraoperative complications.

## 2013-02-17 ENCOUNTER — Encounter: Payer: Self-pay | Admitting: Family Medicine

## 2013-02-19 ENCOUNTER — Encounter (HOSPITAL_BASED_OUTPATIENT_CLINIC_OR_DEPARTMENT_OTHER): Payer: Self-pay | Admitting: Urology

## 2013-02-19 NOTE — Procedures (Signed)
  Radiation Oncology         [phone] ________________________________  Name: Kevin Campbell MRN: [id number]  Date: 12/26/2012  DOB: 04/17/1946       Prostate Seed Implant  NW:GNFAOZ Sharen Hones, MD  No ref. provider found  DIAGNOSIS: 67 y.o. gentleman with stage T2a adenocarcinoma of the prostate with a Gleason's score of 4+3 and a PSA of 5.78  PROCEDURE: Insertion of radioactive I-125 seeds into the prostate gland.  RADIATION DOSE: 110 Gy, boost therapy.  TECHNIQUE: QUINDELL SHERE was brought to the operating room with the urologist. He was placed in the dorsolithotomy position. He was catheterized and a rectal tube was inserted. The perineum was shaved, prepped and draped. The ultrasound probe was then introduced into the rectum to see the prostate gland.  TREATMENT DEVICE: A needle grid was attached to the ultrasound probe stand and anchor needles were placed.  COMPLEX ISODOSE CALCULATION: The prostate was imaged in 3D using a sagittal sweep of the prostate probe. These images were transferred to the planning computer. There, the prostate, urethra and rectum were defined on each axial reconstructed image. Then, the software created an optimized plan and a few seed positions were adjusted. Then the accepted plan was uploaded to the seed Selectron afterloading unit.  SPECIAL TREATMENT PROCEDURE/SUPERVISION AND HANDLING: The Nucletron FIRST system was used to place the needles under sagittal guidance. A total of 16 needles were used to deposit 51 seeds in the prostate gland. The individual seed activity was 0.304 mCi for a total implant activity of 15.504 mCi.  COMPLEX SIMULATION: At the end of the procedure, an anterior radiograph of the pelvis was obtained to document seed positioning and count. Cystoscopy was performed to check the urethra and bladder.  MICRODOSIMETRY: At the end of the procedure, the patient was emitting 0.03 mrem/hr at 1 meter. Accordingly, he was considered  safe for hospital discharge.  PLAN: The patient will return to the radiation oncology clinic for post implant CT dosimetry in three weeks.   ________________________________  Artist Pais Kathrynn Running, M.D.

## 2013-03-07 ENCOUNTER — Telehealth: Payer: Self-pay | Admitting: *Deleted

## 2013-03-07 NOTE — Telephone Encounter (Signed)
Called patient to remind of appts. For 03-08-13, confirmed appts. With patient

## 2013-03-08 ENCOUNTER — Ambulatory Visit
Admission: RE | Admit: 2013-03-08 | Discharge: 2013-03-08 | Disposition: A | Discharge status: Home / Self Care | Payer: BC Managed Care – PPO | Source: Ambulatory Visit | Attending: Radiation Oncology | Admitting: Radiation Oncology

## 2013-03-08 VITALS — BP 132/75 | HR 84 | Temp 98.2°F

## 2013-03-08 DIAGNOSIS — R3911 Hesitancy of micturition: Secondary | ICD-10-CM | POA: Insufficient documentation

## 2013-03-08 DIAGNOSIS — C61 Malignant neoplasm of prostate: Secondary | ICD-10-CM | POA: Insufficient documentation

## 2013-03-08 DIAGNOSIS — R350 Frequency of micturition: Secondary | ICD-10-CM | POA: Insufficient documentation

## 2013-03-08 NOTE — Progress Notes (Signed)
Kevin Campbell here with his wife for post seed visit. He had 51 seeds placed in his prostate on 12/26/2012.  He is reporting pain in his lower back that he rates at a 5/10.  He is also reporting an increase in his urinary symptoms since the seed implant.  He reports that he is having to strain to urinate and is not able to stand to urinate anymore.  He reports a weak urinary stream.  He is going frequently and has urgency.  He is getting up 3 times a night to urinate.  He denies diarrhea but does have burning when he has a bowel movement.  He denies nausea.

## 2013-03-08 NOTE — Progress Notes (Signed)
  Radiation Oncology         [phone] ________________________________  Name: TIRTH COTHRON MRN: [id number]  Date: 03/08/2013  DOB: 1946-01-10  Follow-Up Visit Note  CC: Eustaquio Boyden, MD  Garnett Farm, MD  Diagnosis:   67 y.o. gentleman with stage T2a adenocarcinoma of the prostate with a Gleason's score of 4+3 and a PSA of 5.78  Interval Since Last Radiation:  3  weeks  Narrative:  The patient returns today for routine follow-up.  He is complaining of increased urinary frequency and urinary hesitation symptoms. He filled out a questionnaire regarding urinary function today providing and overall IPSS score of 28 characterizing his symptoms as severe.  His pre-implant score was 12. He denies any bowel symptoms.  ALLERGIES:  is allergic to benadryl; celebrex; codeine; flomax; and hydrocodone.  Meds: Current Outpatient Prescriptions  Medication Sig Dispense Refill  . HYDROcodone-acetaminophen (NORCO) 10-325 MG per tablet Take 1 tablet by mouth every 6 (six) hours as needed for pain.  30 tablet  0  . HYDROcodone-acetaminophen (NORCO/VICODIN) 5-325 MG per tablet Take 1 tablet by mouth every 6 (six) hours as needed for pain.      . methocarbamol (ROBAXIN) 500 MG tablet Take 1 tablet (500 mg total) by mouth every 6 (six) hours as needed.  40 tablet  0  . oxyCODONE (OXY IR/ROXICODONE) 5 MG immediate release tablet Take 5-10 mg by mouth 2 (two) times daily as needed.      . ciprofloxacin (CIPRO) 500 MG tablet Take 1 tablet (500 mg total) by mouth 2 (two) times daily.  6 tablet  0   No current facility-administered medications for this encounter.    Physical Findings: The patient is in no acute distress. Patient is alert and oriented.  temperature is 98.2 F (36.8 C). His blood pressure is 132/75 and his pulse is 84. .  No significant changes.  Lab Findings: Lab Results  Component Value Date   WBC 6.7 02/09/2013   HGB 13.6 02/09/2013   HCT 39.4 02/09/2013   MCV 83.7 02/09/2013   PLT 200 02/09/2013    Radiographic Findings:  Patient underwent CT imaging in our clinic for post implant dosimetry. The CT appears to demonstrate an adequate distribution of radioactive seeds throughout the prostate gland. There no seeds in her near the rectum. I suspect the final radiation plan and dosimetry will show appropriate coverage of the prostate gland.   Impression: The patient is recovering from the effects of radiation. His urinary symptoms should gradually improve over the next 4-6 months. We talked about this today. He is concerned about his symptoms.  Plan: Today, I spent time talking to the patient about his prostate seed implant and resolving urinary symptoms. We also talked about long-term follow-up for prostate cancer following seed implant. He understands that ongoing PSA determinations and digital rectal exams will help perform surveillance to rule out disease recurrence. He understands what to expect with his PSA measures. Patient was also educated today about some of the long-term effects would radiation including the Small risk for rectal bleeding and possibly erectile dysfunction. Talked about some of the general management approaches to these potential complications. However, I did encourage the patient to contact her office or return at any point if he has questions or concerns related to his previous radiation and prostate cancer.   _____________________________________  Artist Pais. Kathrynn Running, M.D.

## 2013-03-08 NOTE — Progress Notes (Signed)
  Radiation Oncology         [phone] ________________________________  Name: Kevin Campbell MRN: [id number]  Date: 03/08/2013  DOB: 11-18-1945  COMPLEX SIMULATION NOTE  NARRATIVE:  The patient was brought to the CT Simulation planning suite today following prostate seed implantation approximately one month ago.  Identity was confirmed.  All relevant records and images related to the planned course of therapy were reviewed.  Then, the patient was set-up supine.  CT images were obtained.  The CT images were loaded into the planning software.  Then the prostate and rectum were contoured.  Treatment planning then occurred.  The implanted iodine 125 seeds were identified by the physics staff for projection of radiation distribution  I have requested : 3D Simulation  I have requested a DVH of the following structures: Prostate and rectum.    ________________________________  Artist Pais Kathrynn Running, M.D.

## 2013-03-12 ENCOUNTER — Encounter: Payer: Self-pay | Admitting: Radiation Oncology

## 2013-03-12 ENCOUNTER — Ambulatory Visit
Admission: RE | Admit: 2013-03-12 | Discharge: 2013-03-12 | Disposition: A | Discharge status: Home / Self Care | Payer: BC Managed Care – PPO | Source: Ambulatory Visit | Attending: Radiation Oncology | Admitting: Radiation Oncology

## 2013-03-12 DIAGNOSIS — C61 Malignant neoplasm of prostate: Secondary | ICD-10-CM | POA: Insufficient documentation

## 2013-03-12 DIAGNOSIS — R350 Frequency of micturition: Secondary | ICD-10-CM | POA: Insufficient documentation

## 2013-03-12 DIAGNOSIS — Z79899 Other long term (current) drug therapy: Secondary | ICD-10-CM | POA: Insufficient documentation

## 2013-03-12 DIAGNOSIS — R300 Dysuria: Secondary | ICD-10-CM | POA: Insufficient documentation

## 2013-03-30 NOTE — Progress Notes (Signed)
  Radiation Oncology         [phone] ________________________________  Name: Kevin Campbell MRN: [id number]  Date: 03/12/2013  DOB: 02-Aug-1946  3-D Planning Note Prostate Brachytherapy  Diagnosis: 67 y.o. gentleman with stage T2a adenocarcinoma of the prostate with a Gleason's score of 4+3 and a PSA of 5.78  Narrative: On a previous date, Kevin Campbell returned following prostate seed implantation for post implant planning. He underwent CT scan complex simulation to delineate the three-dimensional structures of the pelvis and demonstrate the radiation distribution.  Since that time, the seed localization, and 3D planning with dose volume histograms have now been completed.  Results:   Prostate Coverage - The dose of radiation delivered to the 90% or more of the prostate gland (D90) was 109.42% of the prescription dose. This exceeds our goal of greater than 90%. Rectal Sparing - The volume of rectal tissue receiving the prescription dose or higher was 0.05 cc. This falls under our thresholds tolerance of 1.0 cc.  Impression: The prostate seed implant appears to show adequate target coverage and appropriate rectal sparing.  Plan:  The patient will continue to follow with urology for ongoing PSA determinations. I would anticipate a high likelihood for local tumor control with minimal risk for rectal morbidity.  ________________________________  Artist Pais Kathrynn Running, M.D.

## 2013-06-18 ENCOUNTER — Encounter: Payer: Self-pay | Admitting: Family Medicine

## 2013-10-15 ENCOUNTER — Telehealth: Payer: Self-pay | Admitting: Radiation Oncology

## 2013-10-15 NOTE — Telephone Encounter (Signed)
Faxed records to [facility]  OK per [facility].  Received confirmation. °

## 2013-12-21 ENCOUNTER — Encounter: Payer: Self-pay | Admitting: Family Medicine

## 2014-01-07 ENCOUNTER — Encounter: Payer: Self-pay | Admitting: Family Medicine

## 2014-01-07 ENCOUNTER — Ambulatory Visit (INDEPENDENT_AMBULATORY_CARE_PROVIDER_SITE_OTHER)
Admission: RE | Admit: 2014-01-07 | Discharge: 2014-01-07 | Disposition: A | Discharge status: Home / Self Care | Payer: Medicare HMO | Source: Ambulatory Visit | Attending: Family Medicine | Admitting: Family Medicine

## 2014-01-07 ENCOUNTER — Ambulatory Visit (INDEPENDENT_AMBULATORY_CARE_PROVIDER_SITE_OTHER): Payer: Medicare HMO | Admitting: Family Medicine

## 2014-01-07 ENCOUNTER — Ambulatory Visit
Admission: RE | Admit: 2014-01-07 | Discharge: 2014-01-07 | Disposition: A | Discharge status: Home / Self Care | Payer: Medicare HMO | Source: Ambulatory Visit | Attending: Family Medicine | Admitting: Family Medicine

## 2014-01-07 VITALS — BP 136/80 | HR 74 | Temp 98.1°F | Wt 260.2 lb

## 2014-01-07 DIAGNOSIS — M79609 Pain in unspecified limb: Secondary | ICD-10-CM

## 2014-01-07 DIAGNOSIS — M79641 Pain in right hand: Secondary | ICD-10-CM

## 2014-01-07 DIAGNOSIS — M79642 Pain in left hand: Principal | ICD-10-CM

## 2014-01-07 LAB — BASIC METABOLIC PANEL
BUN: 14 mg/dL (ref 6–23)
CHLORIDE: 104 meq/L (ref 96–112)
CO2: 27 meq/L (ref 19–32)
Calcium: 9.2 mg/dL (ref 8.4–10.5)
Creatinine, Ser: 1.3 mg/dL (ref 0.4–1.5)
GFR: 57.79 mL/min — ABNORMAL LOW (ref >60.00)
GLUCOSE: 117 mg/dL — AB (ref 70–99)
POTASSIUM: 4.2 meq/L (ref 3.5–5.1)
Sodium: 137 mEq/L (ref 135–145)

## 2014-01-07 LAB — HIGH SENSITIVITY CRP: CRP, High Sensitivity: 2.01 mg/L (ref 0.000–5.000)

## 2014-01-07 LAB — URIC ACID: URIC ACID, SERUM: 6.5 mg/dL (ref 4.0–7.8)

## 2014-01-07 LAB — RHEUMATOID FACTOR: Rhuematoid fact SerPl-aCnc: <10 IU/mL (ref <=14)

## 2014-01-07 MED ORDER — TRAMADOL HCL 50 MG PO TABS
50.0000 mg | ORAL_TABLET | Freq: Two times a day (BID) | ORAL | Status: DC | PRN
Start: 2014-01-07 — End: 2014-01-25

## 2014-01-07 MED ORDER — MELOXICAM 15 MG PO TABS: ORAL_TABLET | ORAL | Status: DC

## 2014-01-07 NOTE — Progress Notes (Signed)
Pre visit review using our clinic review tool, if applicable. No additional management support is needed unless otherwise documented below in the visit note. 

## 2014-01-07 NOTE — Assessment & Plan Note (Signed)
Significant hand pain R>L hands focused around MCPs.  Known h/o OA s/p L knee replacement. Significant joint breakdown on my read of R 3rd MCP. Will obtain labwork to eval for gout, rheumatoid arthritis and other inflammatory arthritides. In interim, start meloxicam 15mg  daily for 1 wk then prn, may use tramadol for breakthrough pain.  Discussed tramadol precautions with patient. Pt agrees with plan.

## 2014-01-07 NOTE — Progress Notes (Signed)
   BP 136/80  Pulse 74  Temp(Src) 98.1 F (36.7 C) (Oral)  Wt 260 lb 4 oz (118.049 kg)   CC: hand pains   Subjective:    Patient ID: Kevin Campbell, male    DOB: 03/10/1946, 68 y.o.   MRN: [id number]  HPI: Kevin Campbell is a 68 y.o. male presenting on 01/07/2014 for Hand Pain   Kevin Campbell presents today to discuss worsening hand pain R>L for the last 3-4 months.  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  Describes throbbing ache worse at knuckles then radiates down fingers, increased tightness/swelling, decreased grip strength.  Worse at night time - affecting sleeping.  . Using ice pack at night.  Using aleve 1 tablet bid prn. Manual work - working on Therapist, art but causing too much pain. Denies redness or warmth of knuckles.  Lab Results  Component Value Date   CREATININE 1.09 02/09/2013  Denies wrist or IP involvement.  No other joint pains currently (knees, hip, lower back or shoulders) H/o L knee replacement.  Retired 08/2013. Denies inciting trauma/falls. 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 Denies h/o gout. Taking rapaflo every other day - some breast soreness endorsed.  Relevant past medical, surgical, family and social history reviewed and updated as indicated.  Allergies and medications reviewed and updated. No current outpatient prescriptions on file prior to visit.   No current facility-administered medications on file prior to visit.    Review of Systems Per HPI unless specifically indicated above    Objective:    BP 136/80  Pulse 74  Temp(Src) 98.1 F (36.7 C) (Oral)  Wt 260 lb 4 oz (118.049 kg)  Physical Exam  Nursing note and vitals reviewed. Constitutional: He appears well-developed and well-nourished. No distress.  Musculoskeletal:  No wrist pain, FROM at wrist. Tender to palpation mainly at R 3rd MCP with mild swelling noted overlying MCP.  No erythema or warmth of joints however. Grip strength intact bilaterally but unable to  form closed fist on right 2/2 sensation of swelling tightness of hand.       Assessment & Plan:   Problem List Items Addressed This Visit   Bilateral hand pain - Primary     Significant hand pain R>L hands focused around MCPs.  Known h/o OA s/p L knee replacement. Significant joint breakdown on my read of R 3rd MCP. Will obtain labwork to eval for gout, rheumatoid arthritis and other inflammatory arthritides. In interim, start meloxicam 15mg  daily for 1 wk then prn, may use tramadol for breakthrough pain.  Discussed tramadol precautions with patient. Pt agrees with plan.    Relevant Orders      DG Hand Complete Right      DG Hand Complete Left      Basic metabolic panel      High sensitivity CRP      Rheumatoid factor      Cyclic citrul peptide antibody, IgG      Uric acid       Follow up plan: Return if symptoms worsen or fail to improve.

## 2014-01-07 NOTE — Patient Instructions (Signed)
xrays today. Start meloxicam (mobic) for pain/inflammation of hands.  Take with food - 1 wk daily then as needed.  Don't take with aleve or advil or ibuprofen. May use tramadol for breakthrough pain (stronger pain medicine) Let me know if any redness or warmth or worsening pain of joints.

## 2014-01-08 ENCOUNTER — Encounter: Payer: Self-pay | Admitting: Family Medicine

## 2014-01-08 LAB — CYCLIC CITRUL PEPTIDE ANTIBODY, IGG: Cyclic Citrullin Peptide Ab: <2 U/mL (ref 0.0–5.0)

## 2014-01-10 ENCOUNTER — Encounter: Payer: Self-pay | Admitting: Family Medicine

## 2014-01-25 ENCOUNTER — Other Ambulatory Visit: Payer: Self-pay | Admitting: Family Medicine

## 2014-01-27 ENCOUNTER — Other Ambulatory Visit: Payer: Self-pay | Admitting: Family Medicine

## 2014-01-28 NOTE — Telephone Encounter (Signed)
Called into [facility] as directed. Gasper Sells, MA Student

## 2014-02-25 ENCOUNTER — Ambulatory Visit (INDEPENDENT_AMBULATORY_CARE_PROVIDER_SITE_OTHER): Payer: Medicare HMO | Admitting: Family Medicine

## 2014-02-25 ENCOUNTER — Encounter: Payer: Self-pay | Admitting: Family Medicine

## 2014-02-25 VITALS — BP 142/94 | HR 68 | Temp 98.1°F | Wt 259.5 lb

## 2014-02-25 DIAGNOSIS — E669 Obesity, unspecified: Secondary | ICD-10-CM

## 2014-02-25 DIAGNOSIS — M79642 Pain in left hand: Secondary | ICD-10-CM

## 2014-02-25 DIAGNOSIS — N62 Hypertrophy of breast: Secondary | ICD-10-CM

## 2014-02-25 DIAGNOSIS — M79609 Pain in unspecified limb: Secondary | ICD-10-CM

## 2014-02-25 DIAGNOSIS — M79641 Pain in right hand: Secondary | ICD-10-CM

## 2014-02-25 LAB — COMPREHENSIVE METABOLIC PANEL
ALT: 25 U/L (ref 0–53)
AST: 26 U/L (ref 0–37)
Albumin: 3.9 g/dL (ref 3.5–5.2)
Alkaline Phosphatase: 66 U/L (ref 39–117)
BILIRUBIN TOTAL: 0.8 mg/dL (ref 0.2–1.2)
BUN: 16 mg/dL (ref 6–23)
CALCIUM: 9.2 mg/dL (ref 8.4–10.5)
CHLORIDE: 107 meq/L (ref 96–112)
CO2: 27 meq/L (ref 19–32)
Creatinine, Ser: 1.3 mg/dL (ref 0.4–1.5)
GFR: 58.79 mL/min — AB (ref >60.00)
Glucose, Bld: 84 mg/dL (ref 70–99)
Potassium: 4.3 mEq/L (ref 3.5–5.1)
SODIUM: 141 meq/L (ref 135–145)
TOTAL PROTEIN: 7 g/dL (ref 6.0–8.3)

## 2014-02-25 LAB — TSH: TSH: 0.34 u[IU]/mL — ABNORMAL LOW (ref 0.35–4.50)

## 2014-02-25 LAB — LUTEINIZING HORMONE: LH: 16.58 m[IU]/mL — ABNORMAL HIGH (ref 1.50–9.30)

## 2014-02-25 LAB — TESTOSTERONE: Testosterone: 314.42 ng/dL (ref 300.00–890.00)

## 2014-02-25 NOTE — Assessment & Plan Note (Signed)
Discussed noted weight gain over the last year. Encouraged starting exercise regimen for goal weight loss. Wt Readings from Last 3 Encounters:  02/25/14 259 lb 8 oz (117.708 kg)  01/07/14 260 lb 4 oz (118.049 kg)  02/16/13 234 lb (106.142 kg)

## 2014-02-25 NOTE — Assessment & Plan Note (Signed)
Relatively new tender bilateral gynecomastia. No inciting medication or etiology found on H&P. Will commence laboratory workup including CMP, TSH, testosterone, estradiol, LH and bHCG.

## 2014-02-25 NOTE — Progress Notes (Signed)
Pre visit review using our clinic review tool, if applicable. No additional management support is needed unless otherwise documented below in the visit note. 

## 2014-02-25 NOTE — Progress Notes (Signed)
BP 142/94  Pulse 68  Temp(Src) 98.1 F (36.7 C) (Oral)  Wt 259 lb 8 oz (117.708 kg)   CC: breast pain and f/u hand pains  Subjective:    Patient ID: Kevin Campbell, male    DOB: 1945/12/08, 68 y.o.   MRN: [id number]  HPI: Kevin Campbell is a 68 y.o. male presenting on 02/25/2014 for Pain in breast   See prior note for details - seen here 12/2013 with bilateral hand pain - xrays concerning for rheumatoid arthritis. However, RF and anti-CCP levels were normal.  Treated with meloxicam and then tramadol with no improvement. Interested in referral to rheumatology  Bilateral breast pain ongoing for last several months - described as sore breasts and nipples, wife feels his breasts have enlarged some.  Denies mass in breasts. Denies bleeding or discharge. Denies recent vigorous exercise. H/o prostate cancer s/p XRT with seed implant and stable.  Never had hormonal medication for this. Rapaflo recently started by urologist.  Doesn't think he's been on 5a reductase inhibitors - and reviewing urology notes doesn't seem like he's been on these. Denies new rashes or other new joint pains.  No nausea/vomiting.  Asks if he's healthy enough for sex.  Denies dyspnea and chest pain with exertion or with sex.  Has prescription for cialis by Dr. Karsten Ro.  Body mass index is 40.63 kg/(m^2).  Weight gain noted in last year after prostate cancer treatment. Wt Readings from Last 3 Encounters:  02/25/14 259 lb 8 oz (117.708 kg)  01/07/14 260 lb 4 oz (118.049 kg)  02/16/13 234 lb (106.142 kg)    LEFT HAND - COMPLETE 3+ VIEW  COMPARISON: None.  FINDINGS:  There is mild narrowing and sclerosis of the third  metacarpophalangeal joint with mild erosive change and spurring.  There is mild marginal erosion of the fourth metacarpophalangeal  joint and fifth metacarpophalangeal joint. There is mild sclerosis  of the first metacarpal carpal joint. The interphalangeal joints all  show very mild narrowing  and periarticular osteopenia is seen at  many of these. The third and fourth distal interphalangeal joints  demonstrate mild osteophyte formation.  IMPRESSION:  Multifocal arthropathy. Findings suggest the possibility of  rheumatoid arthropathy.  RIGHT HAND - COMPLETE 3+ VIEW  COMPARISON: None.  FINDINGS:  There is mild sclerosis and narrowing with osteophyte formation of  the first carpometacarpal joint. There is moderate degenerative  change of the third metacarpophalangeal joint, with narrowing,  sclerosis, as well as osteophyte formation and erosive change. Of  the second through fifth distal interphalangeal joints, mild, as  well as of the interphalangeal joint of the thumb with minimal  spurring. Proximal interphalangeal joints are also mildly narrowed.  IMPRESSION:  Findings suggestive of rheumatoid arthritis.  Electronically Signed  By: Skipper Cliche M.D.  On: 01/07/2014 14:22  Past Medical History  Diagnosis Date  . Seasonal allergies   . Colon polyps 2011    [name]  . Prostate cancer 08/24/12    Adenocarcinoma,gleason=3+4=7,& 3+3=6,PSA=5.75,volume=23cc(5/12)cores positive; treated with XRT and radioactive seed implant, doing well ([facility])  . GERD (gastroesophageal reflux disease)     watches diet  . Rheumatoid arthritis     of hands by Xray but RF, CCP, CRP negative (12/2013)  . Carpal tunnel syndrome on both sides   . Nocturia   . BPH (benign prostatic hypertrophy)     Past Surgical History  Procedure Laterality Date  . Clavicle surgery Left 1968    REMOVAL OF BONE SPUR FROM PREVIOUS  FX YEARS AGO  . Colonoscopy  2011    1 polyp sigmoid (hyperplastic), diverticulosis, int hemorrhoids [facility] Kocher at [facility])  . Cardiovascular stress test  11-26-2010    Normal lexiscan nuclear study with inferior thinning but no ischemia  . Total knee arthroplasty  09/26/2012    Procedure: TOTAL KNEE ARTHROPLASTY;  Surgeon: Sydnee Cabal, MD;  Location: [facility];  Service: Orthopedics;   Laterality: Left;  . Prostate biopsy  08/24/12    Adenocarcinoma  . Shoulder arthroscopy with biceps tendon repair Right APRIL 2013  . Knee arthroscopy Left JAN 2013  . Radioactive seed implant N/A 02/16/2013    Procedure: RADIOACTIVE PROSTATE SEED IMPLANT;  Surgeon: Claybon Jabs, MD     Relevant past medical, surgical, family and social history reviewed and updated as indicated.  Allergies and medications reviewed and updated. Current Outpatient Prescriptions on File Prior to Visit  Medication Sig  . naproxen sodium (ANAPROX) 220 MG tablet Take 220 mg by mouth 2 (two) times daily as needed.   No current facility-administered medications on file prior to visit.    Review of Systems Per HPI unless specifically indicated above    Objective:    BP 142/94  Pulse 68  Temp(Src) 98.1 F (36.7 C) (Oral)  Wt 259 lb 8 oz (117.708 kg)  Physical Exam  Nursing note and vitals reviewed. Constitutional: He appears well-developed and well-nourished. No distress.  Neck: Normal range of motion. Neck supple. No thyromegaly present.  Cardiovascular: Normal rate, regular rhythm, normal heart sounds and intact distal pulses.   No murmur heard. Pulmonary/Chest: Effort normal and breath sounds normal. No respiratory distress. He has no decreased breath sounds. He has no wheezes. He has no rales. He exhibits tenderness (mild mid sternal discomfort). Right breast exhibits tenderness. Right breast exhibits no inverted nipple, no mass, no nipple discharge and no skin change. Left breast exhibits tenderness. Left breast exhibits no inverted nipple, no mass, no nipple discharge and no skin change. Breasts are symmetrical.  Bilateral tenderness to palpation of nipples, as well as tender glandular breast tissue subareola about 3cm diameter  Musculoskeletal: He exhibits no edema.  Lymphadenopathy:    He has no cervical adenopathy.   Results for orders placed in visit on 30/86/57  BASIC METABOLIC PANEL       Result Value Ref Range   Sodium 137  135 - 145 mEq/L   Potassium 4.2  3.5 - 5.1 mEq/L   Chloride 104  96 - 112 mEq/L   CO2 27  19 - 32 mEq/L   Glucose, Bld 117 (*) 70 - 99 mg/dL   BUN 14  6 - 23 mg/dL   Creatinine, Ser 1.3  0.4 - 1.5 mg/dL   Calcium 9.2  8.4 - 10.5 mg/dL   GFR 57.79 (*) >60.00 mL/min  HIGH SENSITIVITY CRP      Result Value Ref Range   CRP, High Sensitivity 2.010  0.000 - 5.000 mg/L  RHEUMATOID FACTOR      Result Value Ref Range   Rheumatoid Factor <10  <=84 IU/mL  CYCLIC CITRUL PEPTIDE ANTIBODY, IGG      Result Value Ref Range   Cyclic Citrullin Peptide Ab <2.0  0.0 - 5.0 U/mL  URIC ACID      Result Value Ref Range   Uric Acid, Serum 6.5  4.0 - 7.8 mg/dL      Assessment & Plan:   Problem List Items Addressed This Visit   OBESITY      Discussed  noted weight gain over the last year. Encouraged starting exercise regimen for goal weight loss. 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 Meloxicam, tramadol did not help. Pt interested in further evaluation by rheum.  Will refer. ?Seronegative arthritis.    Relevant Orders      Ambulatory referral to Rheumatology   Gynecomastia, male - Primary     Relatively new tender bilateral gynecomastia. No inciting medication or etiology found on H&P. Will commence laboratory workup including CMP, TSH, testosterone, estradiol, LH and bHCG.    Relevant Orders      Comprehensive metabolic panel      TSH      Testosterone      Luteinizing hormone      HCG, tumor marker      Estradiol       Follow up plan: Return if symptoms worsen or fail to improve.

## 2014-02-25 NOTE — Assessment & Plan Note (Signed)
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 Meloxicam, tramadol did not help. Pt interested in further evaluation by rheum.  Will refer. ?Seronegative arthritis.

## 2014-02-25 NOTE — Patient Instructions (Signed)
I'd like to refer you to rheumatology to discuss hand pains. Let's check blood work today for soreness in breasts. Work on losing weight - start incorporating exercise into routine.  Goal 183min/week of moderate intensity aerobic exercise.  I think with weight loss this should get better.

## 2014-02-26 LAB — ESTRADIOL: Estradiol: 46 pg/mL — ABNORMAL HIGH

## 2014-02-28 LAB — BETA HCG QUANT (REF LAB)

## 2014-03-09 ENCOUNTER — Other Ambulatory Visit: Payer: Self-pay | Admitting: Family Medicine

## 2014-03-09 DIAGNOSIS — N62 Hypertrophy of breast: Secondary | ICD-10-CM

## 2014-03-09 DIAGNOSIS — R7989 Other specified abnormal findings of blood chemistry: Secondary | ICD-10-CM

## 2014-03-18 ENCOUNTER — Other Ambulatory Visit (INDEPENDENT_AMBULATORY_CARE_PROVIDER_SITE_OTHER): Payer: Medicare HMO

## 2014-03-18 DIAGNOSIS — R7309 Other abnormal glucose: Secondary | ICD-10-CM

## 2014-03-18 DIAGNOSIS — R946 Abnormal results of thyroid function studies: Secondary | ICD-10-CM

## 2014-03-18 DIAGNOSIS — I10 Essential (primary) hypertension: Secondary | ICD-10-CM

## 2014-03-18 DIAGNOSIS — R7989 Other specified abnormal findings of blood chemistry: Secondary | ICD-10-CM

## 2014-03-18 DIAGNOSIS — R7303 Prediabetes: Secondary | ICD-10-CM

## 2014-03-18 DIAGNOSIS — E785 Hyperlipidemia, unspecified: Secondary | ICD-10-CM

## 2014-03-18 DIAGNOSIS — N62 Hypertrophy of breast: Secondary | ICD-10-CM

## 2014-03-18 LAB — TSH: TSH: 0.95 u[IU]/mL (ref 0.35–4.50)

## 2014-03-18 LAB — T4, FREE: FREE T4: 0.89 ng/dL (ref 0.60–1.60)

## 2014-03-19 LAB — TESTOSTERONE, FREE, TOTAL, SHBG
Sex Hormone Binding: 40 nmol/L (ref 13–71)
TESTOSTERONE-% FREE: 1.8 % (ref 1.6–2.9)
TESTOSTERONE: 333 ng/dL (ref 300–890)
Testosterone, Free: 58.7 pg/mL (ref 47.0–244.0)

## 2014-03-25 ENCOUNTER — Ambulatory Visit (INDEPENDENT_AMBULATORY_CARE_PROVIDER_SITE_OTHER): Payer: Medicare HMO | Admitting: Family Medicine

## 2014-03-25 ENCOUNTER — Encounter: Payer: Self-pay | Admitting: Family Medicine

## 2014-03-25 VITALS — BP 148/84 | HR 68 | Temp 98.0°F | Wt 259.0 lb

## 2014-03-25 DIAGNOSIS — N62 Hypertrophy of breast: Secondary | ICD-10-CM

## 2014-03-25 NOTE — Progress Notes (Signed)
Pre visit review using our clinic review tool, if applicable. No additional management support is needed unless otherwise documented below in the visit note. 

## 2014-03-25 NOTE — Assessment & Plan Note (Addendum)
Reviewed blood work with patient. No etiology found for recent tender bilateral gynecomastia - although now has improved. ?idiopathic gynecomastia I would like to get endo eval of case and will refer today. Pt/wife agree with plan.

## 2014-03-25 NOTE — Patient Instructions (Signed)
Blood work overall stable. Awaiting one final blood test. I'd like to have you see hormone doctor - pass by Marion's office if she's available if not we will call you with appointment. Good to see you today, call us with quesitons. Continue to work on weight loss.

## 2014-03-25 NOTE — Progress Notes (Signed)
   BP 148/84  Pulse 68  Temp(Src) 98 F (36.7 C) (Oral)  Wt 259 lb (117.482 kg)   CC: gynecomastia/labwork f/u  Subjective:    Patient ID: Kevin Campbell, male    DOB: Apr 09, 1946, 68 y.o.   MRN: [id number]  HPI: Kevin Campbell is a 68 y.o. male presenting on 03/25/2014 for Follow-up   Presents with wife today.  Rheum appt pending with Dr. Ouida Sills on 04/03/2014 for bilateral hand pain ?rheumatoid arthritis.  Persistent breast soreness from gynecomastia - but this has improved significantly. Persistent enlargement noted. Reviewed recent f/u labwork - estradiol still pending.  Denies EtOH. No rec drugs. Only on rapaflo and NSAID.  Relevant past medical, surgical, family and social history reviewed and updated as indicated.  Allergies and medications reviewed and updated. Current Outpatient Prescriptions on File Prior to Visit  Medication Sig  . naproxen sodium (ANAPROX) 220 MG tablet Take 220 mg by mouth 2 (two) times daily as needed.  . silodosin (RAPAFLO) 4 MG CAPS capsule Take 4 mg by mouth every other day.   No current facility-administered medications on file prior to visit.    Review of Systems Per HPI unless specifically indicated above    Objective:    BP 148/84  Pulse 68  Temp(Src) 98 F (36.7 C) (Oral)  Wt 259 lb (117.482 kg)  Physical Exam  Nursing note and vitals reviewed. Constitutional: He appears well-developed and well-nourished. No distress.  Pulmonary/Chest:  Did not re examine today. Rrom prior note: Bilateral tenderness to palpation of nipples, as well as tender glandular breast tissue subareola about 3cm diameter   Genitourinary: Testes normal and penis normal. Right testis shows no mass, no swelling and no tenderness. Right testis is descended. Left testis shows no mass, no swelling and no tenderness. Left testis is descended. Uncircumcised.   Results for orders placed in visit on 03/18/14  TSH      Result Value Ref Range   TSH 0.95  0.35 -  4.50 uIU/mL  T4, FREE      Result Value Ref Range   Free T4 0.89  0.60 - 1.60 ng/dL  TESTOSTERONE, FREE, TOTAL      Result Value Ref Range   Testosterone 333  300 - 890 ng/dL   Sex Hormone Binding 40  13 - 71 nmol/L   Testosterone, Free 58.7  47.0 - 244.0 pg/mL   Testosterone-% Free 1.8  1.6 - 2.9 %  ESTRADIOL, FREE      Result Value Ref Range   Estradiol, Free       Estradiol       Results Received          Assessment & Plan:   Problem List Items Addressed This Visit   Gynecomastia, male - Primary     Reviewed blood work with patient. No etiology found for recent tender bilateral gynecomastia - although now has improved. ?idiopathic gynecomastia I would like to get endo eval of case and will refer today. Pt/wife agree with plan.    Relevant Orders      Ambulatory referral to Endocrinology       Follow up plan: Return as needed.

## 2014-03-27 LAB — ESTRADIOL, FREE
Estradiol, Free: 0.99 pg/mL — ABNORMAL HIGH
Estradiol: 48 pg/mL — ABNORMAL HIGH

## 2014-04-02 ENCOUNTER — Ambulatory Visit (INDEPENDENT_AMBULATORY_CARE_PROVIDER_SITE_OTHER): Payer: Medicare HMO | Admitting: Internal Medicine

## 2014-04-02 ENCOUNTER — Encounter: Payer: Self-pay | Admitting: Family Medicine

## 2014-04-02 ENCOUNTER — Encounter: Payer: Self-pay | Admitting: Internal Medicine

## 2014-04-02 VITALS — BP 122/90 | HR 58 | Temp 98.2°F | Ht 67.0 in | Wt 253.0 lb

## 2014-04-02 DIAGNOSIS — N62 Hypertrophy of breast: Secondary | ICD-10-CM

## 2014-04-02 MED ORDER — TAMOXIFEN CITRATE 10 MG PO TABS: 10.0000 mg | ORAL_TABLET | Freq: Two times a day (BID) | ORAL | Status: DC

## 2014-04-02 NOTE — Patient Instructions (Signed)
Please start Tamoxifen 10 mg 2x a day for 3 months.  Please return for another visit in 6 months. We will need to repeat your labs then.

## 2014-04-02 NOTE — Progress Notes (Addendum)
Patient ID: Kevin Campbell, male   DOB: 03/23/1946, 68 y.o.   MRN: [id number]  HPI: Kevin Campbell is a 68 y.o.-year-old man, referred by his PCP, Dr. Danise Mina, for evaluation and management of gynecomastia (GM).  Pt started to have pain in his breasts after his surgery for Pr Ca in 01/2013. He started to have pain in both breasts 3-4 mo ago. Nipples hurt too. No discharge from breasts.  Reviewed labs obtained by Dr Danise Mina: testosterone normal but estradiol high. Beta HCG normal. LH appropriately elevated: Component     Latest Ref Rng 02/25/2014 03/18/2014  Testosterone     300 - 890 ng/dL 314.42 333  Sex Hormone Binding     13 - 71 nmol/L  40  Testosterone Free     47.0 - 244.0 pg/mL  58.7  Testosterone-% Free     1.6 - 2.9 %  1.8  Estradiol, Free       0.99 (H)  Estradiol      46.0 (H) 48 (H)  Results received       03/27/14  LH     1.50 - 9.30 mIU/mL 16.58 (H)   Beta hCG, Tumor Marker     < 5.0 mIU/mL < 2.0    Pt's thyroid tests have also been normal at last check, although TSH a little low at one previous check: Component     Latest Ref Rng 11/15/2010 11/08/2011 01/28/2012 02/25/2014 03/18/2014  TSH     0.35 - 4.50 uIU/mL 0.818 1.13 0.98 0.34 (L) 0.95  Free T4     0.60 - 1.60 ng/dL     0.89   Of note, pt has a h/o Prostate cancer, s/p LUTS and s/p I125 radioactive seeds implant in 02/2013. He had BPH and was started by his urologist, Dt Ottelin, on Rapaflo. His erectile dysfunction and gynecomastia started after RxTx and starting the androgen alpha blockade. Since then, he saw urology on 02/22/2014 and Rapaflo was stopped due to the fact that it did not improve his BPH sxs. It was considered that Rapaflo is not the cause for his ED and GM. He was started on Myrbetriq and recommended to try Cialis. He tried Myrbetriq but did not help >> started back on Rapaflo every other day.  He did not have a mammogram.   He admits for decreased libido Has difficulty obtaining or  maintaining an erection No trauma to testes + testicular irradiation (see above) No h/o of mumps orchitis/h/o autoimmune ds. No h/o cryptorchidism No shrinking of testes. No very small testes (<5 ml)    No loss of body hair (axillary/pubic)/decreased need for shaving + hot flushes + vision problems No personal h/o infertility - has 2 children + excessive weight gain (15-18 lbs since surgery) - initially lost 20 lbs during the tx, then gained it back Not on opiates, does not take steroids.  No more than 2 drinks a day of alcohol at a time, and this is rarely No anabolic steroids use Not on antidepressants  ROS: see HPI + Constitutional: + both weight gain/loss, no fatigue, + subjective hyperthermia (hot flushes), + nocturia  Eyes: no blurry vision, no xerophthalmia ENT: no sore throat, no nodules palpated in throat, no dysphagia/odynophagia, no hoarseness Cardiovascular: + CP/no SOB/palpitations/+ leg swelling Respiratory: no cough/SOB Gastrointestinal: no N/V/D/C, + heartburn Musculoskeletal: no muscle/joint aches Skin: no rashes, + hair loss, + easy bruising Neurological: no tremors/numbness/tingling/dizziness, + occasional HA Psychiatric: no depression/anxiety  Past Medical History  Diagnosis Date  .  Seasonal allergies   . Colon polyps 2011    [name]  . Prostate cancer 08/24/12    Adenocarcinoma,gleason=3+4=7,& 3+3=6,PSA=5.75,volume=23cc(5/12)cores positive; treated with XRT and radioactive seed implant, doing well ([facility])  . GERD (gastroesophageal reflux disease)     watches diet  . Rheumatoid arthritis     of hands by Xray but RF, CCP, CRP negative (12/2013)  . Carpal tunnel syndrome on both sides   . Nocturia   . BPH (benign prostatic hypertrophy)    Past Surgical History  Procedure Laterality Date  . Clavicle surgery Left 1968    REMOVAL OF BONE SPUR FROM PREVIOUS FX YEARS AGO  . Colonoscopy  2011    1 polyp sigmoid (hyperplastic), diverticulosis, int  hemorrhoids [facility] Kocher at [facility])  . Cardiovascular stress test  11-26-2010    Normal lexiscan nuclear study with inferior thinning but no ischemia  . 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 Prostate biopsy  08/24/12    Adenocarcinoma  . Shoulder arthroscopy with biceps tendon repair Right APRIL 2013  . Knee arthroscopy Left JAN 2013  . Radioactive seed implant N/A 02/16/2013    Procedure: RADIOACTIVE PROSTATE SEED IMPLANT;  Surgeon: Claybon Jabs, MD   History   Social History  . Marital Status: Married    Spouse Name: N/A    Number of Children: 2   Occupational History  . retired   Social History Main Topics  . Smoking status: Former Smoker -- 5 years    Types: Cigars    Quit date: 10/11/1990  . Smokeless tobacco: Never Used     Comment: usually  chewed on cigars  . Alcohol Use: No  . Drug Use: No   Social History Narrative   Caffeine: 2 cups coffee occasionally, soda throughout the day   Lives with wife, no pets.  2 grown children   Occupation: truck driver   Edu: HS   Activity: sedentary   Diet: rare water, good fruits and vegetables   Current Outpatient Prescriptions on File Prior to Visit  Medication Sig Dispense Refill  . naproxen sodium (ANAPROX) 220 MG tablet Take 220 mg by mouth 2 (two) times daily as needed.      . silodosin (RAPAFLO) 4 MG CAPS capsule Take 4 mg by mouth every other day.       No current facility-administered medications on file prior to visit.   Allergies  Allergen Reactions  . Benadryl [Diphenhydramine Hcl] Shortness Of Breath  . Celebrex [Celecoxib] Other (See Comments)    Extreme drowsiness  . Codeine Other (See Comments)    Extreme fatigue; dizziness  . Flomax [Tamsulosin Hcl] Other (See Comments)    "can't sleep"  . Hydrocodone Other (See Comments)    Drunk feeling   Family History  Problem Relation Age  of Onset  . Coronary artery disease Mother 28    3vCABG  . Arthritis Mother   . Cancer Father     lung, smoker  . Cancer Sister     twin sister - breast  . Stroke Neg Hx   . Diabetes Neg Hx   . Rheumatologic disease Mother    PE: BP 122/90  Pulse 58  Temp(Src) 98.2 F (36.8 C) (Oral)  Ht '5\' 7"'  (1.702 m)  Wt 253 lb (114.76 kg)  BMI 39.62 kg/m2  SpO2 94% Wt Readings from Last 3 Encounters:  04/02/14  253 lb (114.76 kg)  03/25/14 259 lb (117.482 kg)  02/25/14 259 lb 8 oz (117.708 kg)   Constitutional: overweight, in NAD Eyes: PERRLA, EOMI, no exophthalmos ENT: moist mucous membranes, no thyromegaly, no cervical lymphadenopathy Cardiovascular: RRR, No MRG Respiratory: CTA B Gastrointestinal: abdomen soft, NT, ND, BS+ Musculoskeletal: no deformities, strength intact in all 4 Skin: moist, warm, no rashes Neurological: no tremor with outstretched hands, DTR normal in all 4 Genital exam: deferred + bilateral symmetric gynecomastia (~2-3 cm), non-nodular, w/o nipple discharge  ASSESSMENT: 1. Gynecomastia - normal free T  2. ED - started after testicular irradiation  PLAN:  1. Gynecomastia  - likely 2/2 testicular irradiation >> low testosterone >> compensatory increase in LH >> increase in estradiol >> gynecomastia  - since this is the expected evolution of the hormone levels after testicular irradiation, I do not have a good reason to suspect other etiology (especially since beta HCG also normal >> likely no testicular tumor) - will need to repeat tests in the future (LH, estradiol, testosterone, prolactin, TSH, free T4, and maybe a mammogram if pain and GM still present, although I doubt BrCa since he has symmetric, non-nodular GM, w/o nipple discharge), but we will do this when he comes back in 6 mo - start Tamoxifen 10 mg bid x 3 mo, then taper off and stay off for 3 mo. Explained possible SEs. - RTC in 6 mo  2. Erectile dysfunction - to try PDE5 inhibitor as  recommended by Dr Karsten Ro

## 2014-05-18 IMAGING — CR DG HAND COMPLETE 3+V*L*
3 series · 3 of 3 positions shown · non-contrast
Comparison: None.

CLINICAL DATA: Evaluate arthritis of bilateral hands right worse
than left, with pain involving first second and third
metacarpophalangeal joints

EXAM:
LEFT HAND - COMPLETE 3+ VIEW

[view not recorded (1 of 3)]
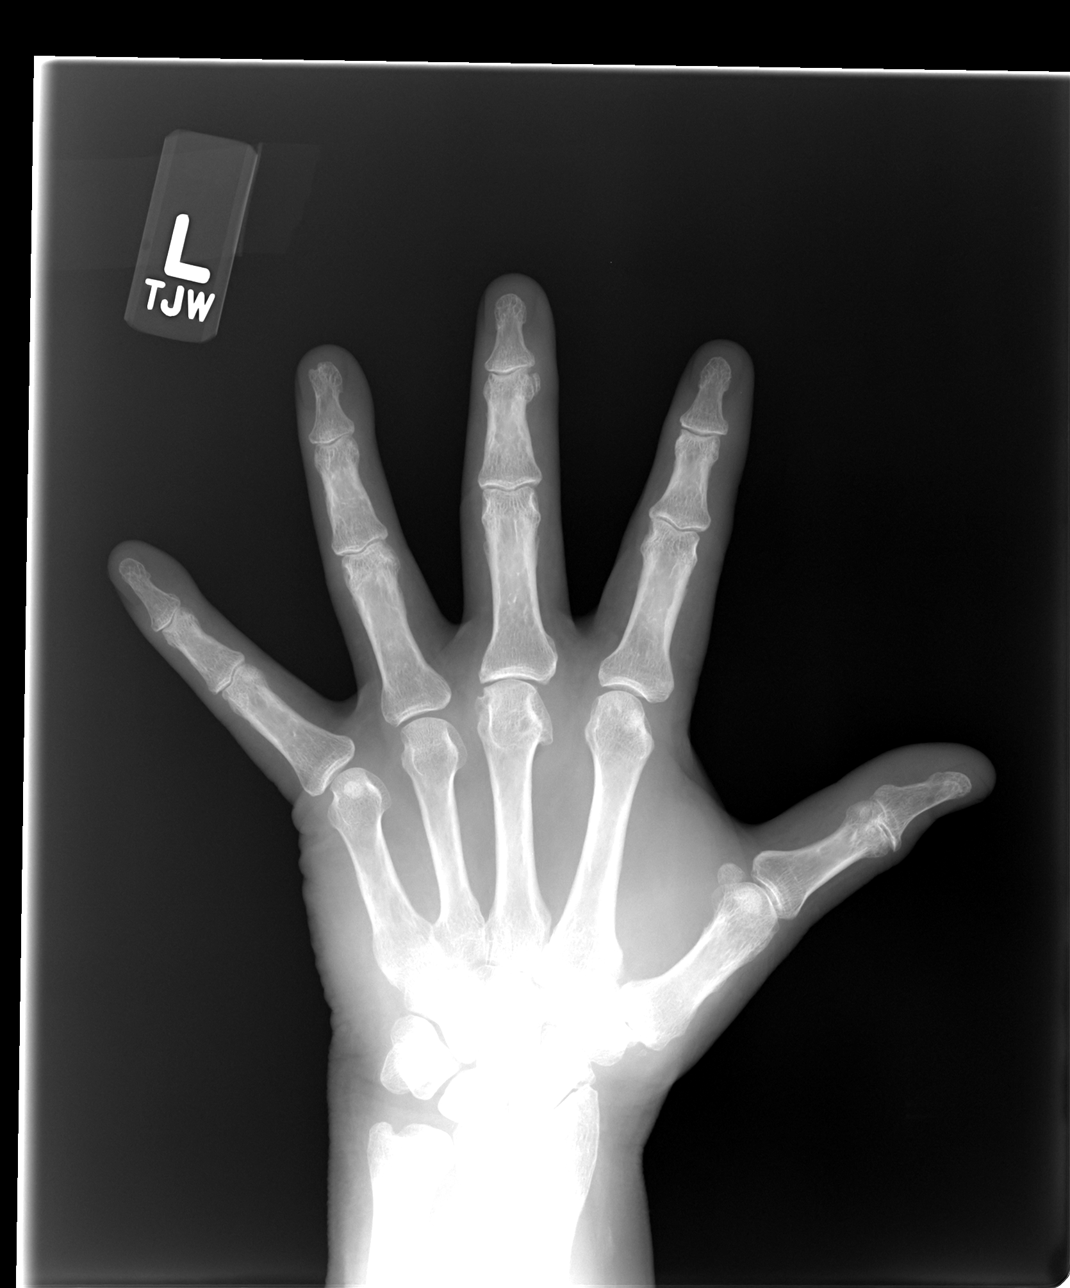

[view not recorded (2 of 3)]
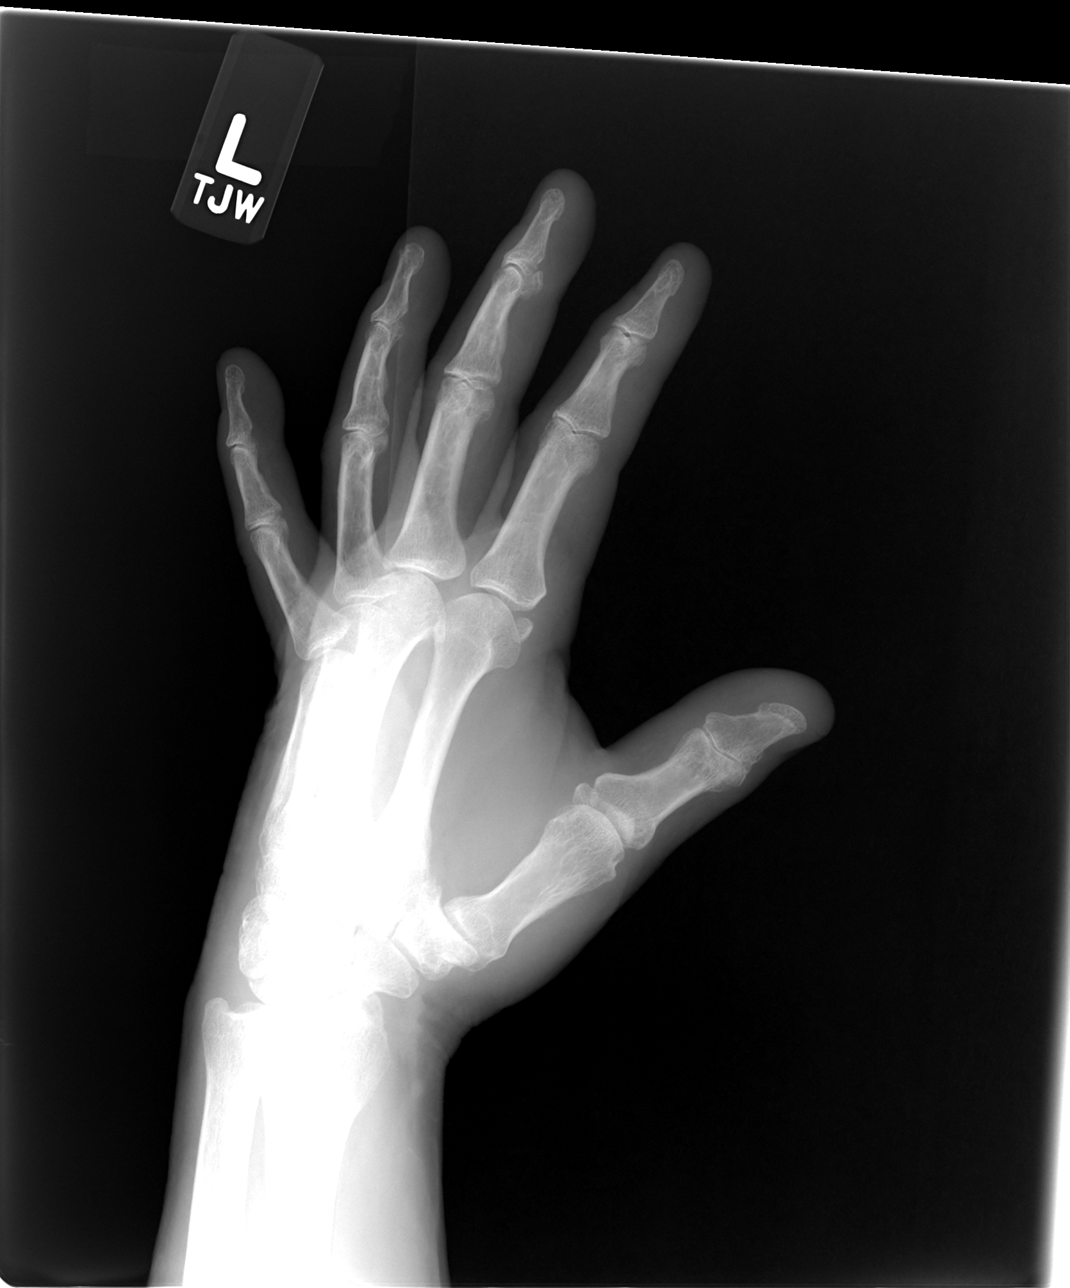

[view not recorded (3 of 3)]
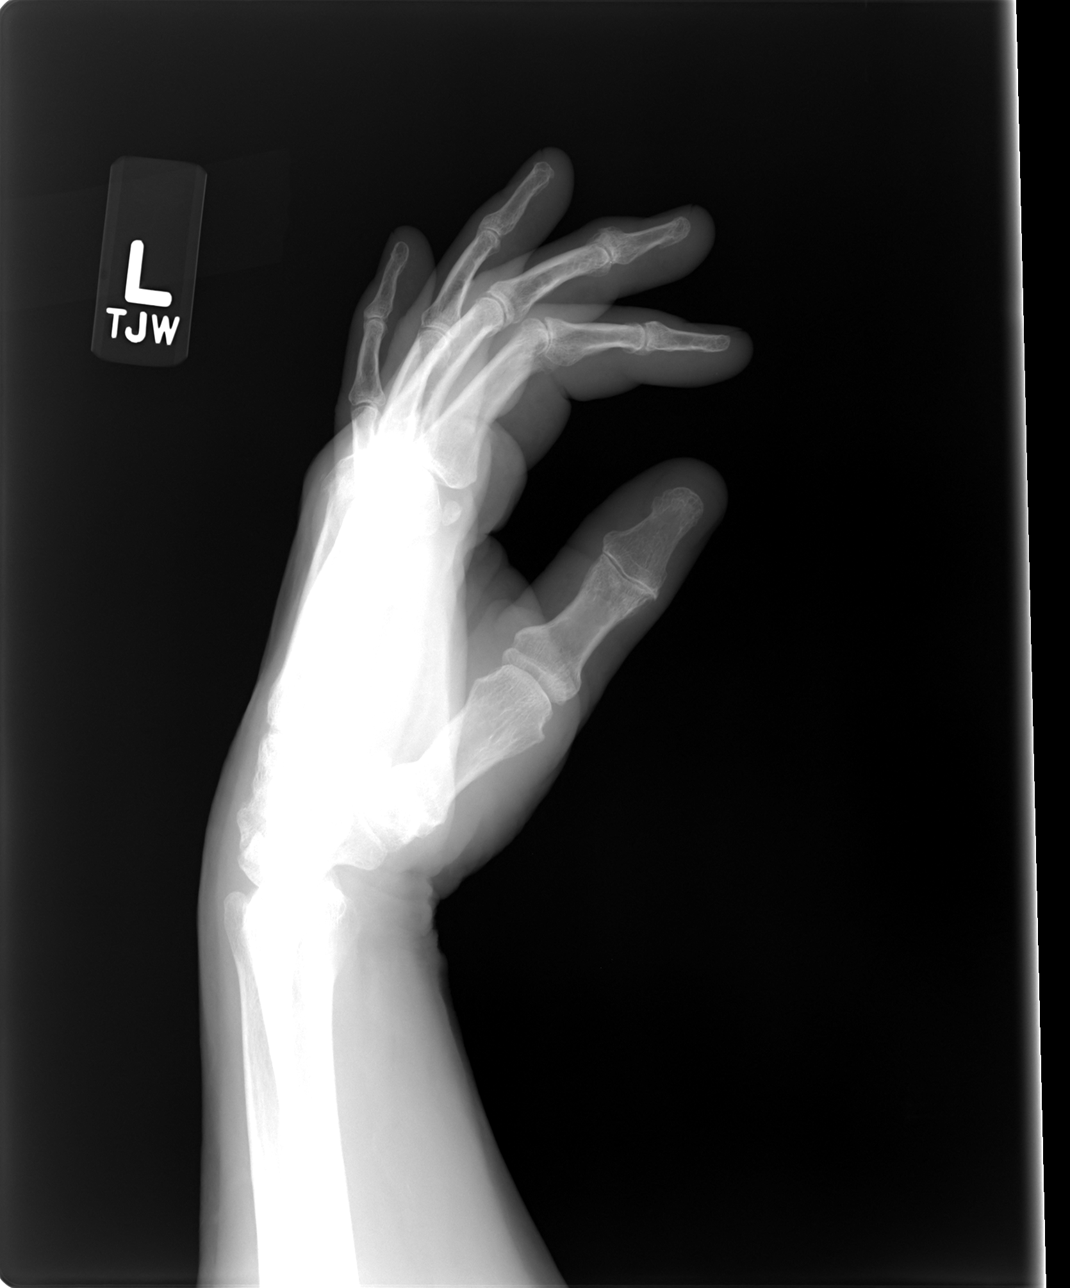

[3 of 3 positions shown; findings below may reference images not displayed]

FINDINGS: There is mild narrowing and sclerosis of the third
metacarpophalangeal joint with mild erosive change and spurring.
There is mild marginal erosion of the fourth metacarpophalangeal
joint and fifth metacarpophalangeal joint. There is mild sclerosis
of the first metacarpal carpal joint. The interphalangeal joints all
show very mild narrowing and periarticular osteopenia is seen at
many of these. The third and fourth distal interphalangeal joints
demonstrate mild osteophyte formation.
IMPRESSION: Multifocal arthropathy. Findings suggest the possibility of
rheumatoid arthropathy.

## 2014-07-15 ENCOUNTER — Encounter: Payer: Self-pay | Admitting: Family Medicine

## 2014-08-09 ENCOUNTER — Encounter: Payer: Self-pay | Admitting: Family Medicine

## 2014-08-09 ENCOUNTER — Ambulatory Visit (INDEPENDENT_AMBULATORY_CARE_PROVIDER_SITE_OTHER): Payer: Medicare HMO | Admitting: Family Medicine

## 2014-08-09 VITALS — BP 148/92 | HR 76 | Temp 98.2°F | Wt 256.8 lb

## 2014-08-09 DIAGNOSIS — R3915 Urgency of urination: Secondary | ICD-10-CM

## 2014-08-09 DIAGNOSIS — N401 Enlarged prostate with lower urinary tract symptoms: Secondary | ICD-10-CM

## 2014-08-09 DIAGNOSIS — C61 Malignant neoplasm of prostate: Secondary | ICD-10-CM

## 2014-08-09 DIAGNOSIS — R351 Nocturia: Secondary | ICD-10-CM

## 2014-08-09 DIAGNOSIS — N3281 Overactive bladder: Secondary | ICD-10-CM | POA: Insufficient documentation

## 2014-08-09 LAB — POCT URINALYSIS DIPSTICK
BILIRUBIN UA: NEGATIVE
Blood, UA: NEGATIVE
GLUCOSE UA: NEGATIVE
KETONES UA: NEGATIVE
LEUKOCYTES UA: NEGATIVE
Nitrite, UA: NEGATIVE
PH UA: 6
Protein, UA: NEGATIVE
Spec Grav, UA: 1.02
Urobilinogen, UA: 0.2

## 2014-08-09 MED ORDER — TADALAFIL 5 MG PO TABS: 5.0000 mg | ORAL_TABLET | Freq: Every day | ORAL | Status: DC

## 2014-08-09 MED ORDER — TERAZOSIN HCL 5 MG PO CAPS: 5.0000 mg | ORAL_CAPSULE | Freq: Every day | ORAL | Status: DC

## 2014-08-09 NOTE — Assessment & Plan Note (Signed)
Worsening sxs over last few months, has failed several meds including myrbetriq, rapaflo, toviaz, intolerant to flomax. Previously on jalyn with good results. Anticipate BPH related along with overactive bladder - will trial cialis 5mg  daily for ED/BPH (coupon book provided) and if not improved sent in trial of terazosin. May need to restart jalyn as well. UA today WNL. Pt will update [place] with effect.

## 2014-08-09 NOTE — Assessment & Plan Note (Signed)
Stable with prostate seed implants.

## 2014-08-09 NOTE — Patient Instructions (Addendum)
Back off most caffeine. No fluids after 9pm. Void before getting into bed. Let's try cialis 5mg  daily for 2 weeks, if this doesn't help, then may start terazosin 5mg  nightly for prostate If not better with this, let myself or Dr. Karsten Ro know.

## 2014-08-09 NOTE — Progress Notes (Signed)
BP 148/92  Pulse 76  Temp(Src) 98.2 F (36.8 C) (Oral)  Wt 256 lb 12 oz (116.461 kg)   CC: urinary trouble  Subjective:    Patient ID: Kevin Campbell, male    DOB: 06-18-1946, 68 y.o.   MRN: [id number]  HPI: Kevin Campbell is a 68 y.o. male presenting on 08/09/2014 for Nocturia   Worsening nocturia x5-6. Saw Dr Karsten Ro, told prostate was fine. Daytime and night time frequency, incomplete emptying, voids again if sits for 2-3 min on commode. Some dysuria. Weak stream.   No fevers/chills, abd pain, hematuria, dribbling.  Prostate cancer s/p radioactive seed implant. BPH with nocturia, overactive bladder failed rapaflo, toviaz and myrbetriq. Thinks he's tried rapaflo without improvement.  No stress incontinence sxs. Marked urgency and leaking if he doesn't get to bathroom in time. This is affecting his quality of life - unable to go out and do things he wants to do. + key in door and foot on floor phenomenon.  Relevant past medical, surgical, family and social history reviewed and updated as indicated.  Allergies and medications reviewed and updated. Current Outpatient Prescriptions on File Prior to Visit  Medication Sig  . naproxen sodium (ANAPROX) 220 MG tablet Take 220 mg by mouth 2 (two) times daily as needed.   No current facility-administered medications on file prior to visit.   Past Medical History  Diagnosis Date  . Seasonal allergies   . Colon polyps 2011    [name]  . Prostate cancer 08/24/12    Adenocarcinoma,gleason=3+4=7,& 3+3=6,PSA=5.75,volume=23cc(5/12)cores positive; treated with XRT and radioactive seed implant, doing well ([facility])  . GERD (gastroesophageal reflux disease)     watches diet  . Rheumatoid arthritis     of hands by Xray but RF, CCP, CRP negative (12/2013)  . Carpal tunnel syndrome on both sides   . Nocturia   . BPH (benign prostatic hypertrophy)   . Overactive bladder     since prostate seed implants, failed myrbetriq, rapaflo      Past Surgical History  Procedure Laterality Date  . Clavicle surgery Left 1968    REMOVAL OF BONE SPUR FROM PREVIOUS FX YEARS AGO  . Colonoscopy  2011    1 polyp sigmoid (hyperplastic), diverticulosis, int hemorrhoids [facility] Kocher at [facility])  . Cardiovascular stress test  11-26-2010    Normal lexiscan nuclear study with inferior thinning but no ischemia  . 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 Prostate biopsy  08/24/12    Adenocarcinoma  . Shoulder arthroscopy with biceps tendon repair Right APRIL 2013  . Knee arthroscopy Left JAN 2013  . Radioactive seed implant N/A 02/16/2013    Procedure: RADIOACTIVE PROSTATE SEED IMPLANT;  Surgeon: Claybon Jabs, MD    Review of Systems Per HPI unless specifically indicated above    Objective:    BP 148/92  Pulse 76  Temp(Src) 98.2 F (36.8 C) (Oral)  Wt 256 lb 12 oz (116.461 kg)  Physical Exam  Nursing note and vitals reviewed. Constitutional: He appears well-developed and well-nourished. No distress.   Results for orders placed in visit on 08/09/14  POCT URINALYSIS DIPSTICK      Result Value Ref Range   Color, UA Yellow     Clarity, UA Clear     Glucose, UA Negative     Bilirubin, UA Negative     Ketones, UA Negative  Spec Grav, UA 1.020     Blood, UA Negative     pH, UA 6.0     Protein, UA Negative     Urobilinogen, UA 0.2     Nitrite, UA Negative     Leukocytes, UA Negative        Assessment & Plan:   Problem List Items Addressed This Visit   Urinary urgency     Worsening sxs over last few months, has failed several meds including myrbetriq, rapaflo, toviaz, intolerant to flomax. Previously on jalyn with good results. Anticipate BPH related along with overactive bladder - will trial cialis 5mg  daily for ED/BPH (coupon book provided) and if not improved sent in trial of terazosin. May need to  restart jalyn as well. UA today WNL. Pt will update [place] with effect.    Primary prostate adenocarcinoma     Stable with prostate seed implants.    BPH associated with nocturia - Primary     See above. Previously on [place].     Other Visit Diagnoses   Nocturia more than twice per night        Relevant Orders       POCT Urinalysis Dipstick (Completed)        Follow up plan: Return if symptoms worsen or fail to improve.

## 2014-08-09 NOTE — Assessment & Plan Note (Signed)
See above. Previously on [place].

## 2014-08-09 NOTE — Progress Notes (Signed)
Pre visit review using our clinic review tool, if applicable. No additional management support is needed unless otherwise documented below in the visit note. 

## 2014-08-26 ENCOUNTER — Ambulatory Visit (INDEPENDENT_AMBULATORY_CARE_PROVIDER_SITE_OTHER): Payer: Medicare HMO | Admitting: Family Medicine

## 2014-08-26 ENCOUNTER — Encounter: Payer: Self-pay | Admitting: Family Medicine

## 2014-08-26 VITALS — BP 154/86 | HR 60 | Temp 98.1°F | Wt 259.5 lb

## 2014-08-26 DIAGNOSIS — N401 Enlarged prostate with lower urinary tract symptoms: Secondary | ICD-10-CM

## 2014-08-26 DIAGNOSIS — R351 Nocturia: Secondary | ICD-10-CM

## 2014-08-26 DIAGNOSIS — N411 Chronic prostatitis: Secondary | ICD-10-CM | POA: Insufficient documentation

## 2014-08-26 LAB — POCT URINALYSIS DIPSTICK
Bilirubin, UA: NEGATIVE
Blood, UA: NEGATIVE
Glucose, UA: NEGATIVE
Ketones, UA: NEGATIVE
Leukocytes, UA: NEGATIVE
Nitrite, UA: NEGATIVE
Protein, UA: NEGATIVE
Spec Grav, UA: 1.025
Urobilinogen, UA: NEGATIVE
pH, UA: 6

## 2014-08-26 MED ORDER — SULFAMETHOXAZOLE-TRIMETHOPRIM 800-160 MG PO TABS: 1.0000 | ORAL_TABLET | Freq: Two times a day (BID) | ORAL | Status: DC

## 2014-08-26 MED ORDER — DUTASTERIDE-TAMSULOSIN HCL 0.5-0.4 MG PO CAPS: 1.0000 | ORAL_CAPSULE | Freq: Every day | ORAL | Status: DC

## 2014-08-26 NOTE — Progress Notes (Signed)
Pre visit review using our clinic review tool, if applicable. No additional management support is needed unless otherwise documented below in the visit note. 

## 2014-08-26 NOTE — Assessment & Plan Note (Signed)
Sxs are concerning for chronic prostate infection despite clear UA today. Will send UCx and will treat presumptively for this - with 2 wk bactrim course. If not improved, start jalyn and f/u with urology. Will send today's not attn Dr. Karsten Ro.

## 2014-08-26 NOTE — Assessment & Plan Note (Signed)
Has failed myriad of treatment options. Will start jalyn. See below for further treatment plan.

## 2014-08-26 NOTE — Progress Notes (Signed)
BP 154/86 mmHg  Pulse 60  Temp(Src) 98.1 F (36.7 C) (Oral)  Wt 259 lb 8 oz (117.708 kg)  SpO2 97%   CC: f/u visit  Subjective:    Patient ID: Kevin Campbell, male    DOB: 07-24-1946, 68 y.o.   MRN: [id number]  HPI: Kevin Campbell is a 68 y.o. male presenting on 08/26/2014 for Follow-up   Saw Dr Karsten Ro 06/2014, prostate ok. Daytime and night time frequency continues, incomplete emptying, voids again if sits for 2-3 min on commode. Persistent dysuria. Weak stream.   H/o prostate cancer s/p radioactive seed implant. Also with known BPH with nocturia, overactive bladder failed rapaflo, toviaz and myrbetriq. cialis didn't help, terazosin didn't help. Intolerant to flomax. Previously jalyn worked well. Last visit we prescribed cialis 75m daily along with terazosin to try. UA last visit normal.   This has affected his quality of life - unable to go out and do things he wants to do. + key in door and foot on floor phenomenon.   Denies fevers/chills, abd pain, nausea/vomiting, flank or back pain. Has cut back on caffeine to no avail.  Relevant past medical, surgical, family and social history reviewed and updated as indicated.  Allergies and medications reviewed and updated. Current Outpatient Prescriptions on File Prior to Visit  Medication Sig  . naproxen sodium (ANAPROX) 220 MG tablet Take 220 mg by mouth 2 (two) times daily as needed.  . tadalafil (CIALIS) 5 MG tablet Take 1 tablet (5 mg total) by mouth daily.  .Marland Kitchenterazosin (HYTRIN) 5 MG capsule Take 1 capsule (5 mg total) by mouth at bedtime.   No current facility-administered medications on file prior to visit.   Past Medical History  Diagnosis Date  . Seasonal allergies   . Colon polyps 2011    [name]  . Prostate cancer 08/24/12    Adenocarcinoma,gleason=3+4=7,& 3+3=6,PSA=5.75,volume=23cc(5/12)cores positive; treated with XRT and radioactive seed implant, doing well ([facility]tt[facility])  . GERD (gastroesophageal reflux disease)       watches diet  . Rheumatoid arthritis     of hands by Xray but RF, CCP, CRP negative (12/2013)  . Carpal tunnel syndrome on both sides   . Nocturia   . BPH (benign prostatic hypertrophy)   . Overactive bladder     since prostate seed implants, failed myrbetriq, rapaflo    Review of Systems Per HPI unless specifically indicated above    Objective:    BP 154/86 mmHg  Pulse 60  Temp(Src) 98.1 F (36.7 C) (Oral)  Wt 259 lb 8 oz (117.708 kg)  SpO2 97%  Physical Exam  Constitutional: He appears well-developed and well-nourished. No distress.  Abdominal: Soft. Normal appearance and bowel sounds are normal. He exhibits no distension and no mass. There is no tenderness. There is no rigidity, no rebound, no guarding, no CVA tenderness and negative Murphy's sign.  Genitourinary: Rectum normal. Rectal exam shows no external hemorrhoid, no internal hemorrhoid, no fissure, no mass, no tenderness and anal tone normal. Prostate is enlarged (boggy) and tender (mild tenderness to palpation).  Nursing note and vitals reviewed.      Assessment & Plan:   Problem List Items Addressed This Visit    Prostatitis, chronic - Primary    Sxs are concerning for chronic prostate infection despite clear UA today. Will send UCx and will treat presumptively for this - with 2 wk bactrim course. If not improved, start jalyn and f/u with urology. Will send today's not attn Dr. [place]arsten Ro  Relevant Orders      Urine culture   BPH associated with nocturia    Has failed myriad of treatment options. Will start jalyn. See below for further treatment plan.    Relevant Medications      Dutasteride-Tamsulosin HCl 0.5-0.4 MG CAPS   Other Relevant Orders      POCT Urinalysis Dipstick       Follow up plan: No Follow-up on file.

## 2014-08-26 NOTE — Patient Instructions (Addendum)
Urine checked today. I think you have a prostate infection.  Treat with bactrim twice daily for next 2 weeks and then if not better, start jalyn for enlarged prostate as this has worked well for you in the past. If not better with this, please return to see Dr Karsten Ro for follow up. I will send him today's note.  Prostatitis The prostate gland is about the size and shape of a walnut. It is located just below your bladder. It produces one of the components of semen, which is made up of sperm and the fluids that help nourish and transport it out from the testicles. Prostatitis is inflammation of the prostate gland.  There are four types of prostatitis:  Acute bacterial prostatitis. This is the least common type of prostatitis. It starts quickly and usually is associated with a bladder infection, high fever, and shaking chills. It can occur at any age.  Chronic bacterial prostatitis. This is a persistent bacterial infection in the prostate. It usually develops from repeated acute bacterial prostatitis or acute bacterial prostatitis that was not properly treated. It can occur in men of any age but is most common in middle-aged men whose prostate has begun to enlarge. The symptoms are not as severe as those in acute bacterial prostatitis. Discomfort in the part of your body that is in front of your rectum and below your scrotum (perineum), lower abdomen, or in the head of your penis (glans) may represent your primary discomfort.  Chronic prostatitis (nonbacterial). This is the most common type of prostatitis. It is inflammation of the prostate gland that is not caused by a bacterial infection. The cause is unknown and may be associated with a viral infection or autoimmune disorder.  Prostatodynia (pelvic floor disorder). This is associated with increased muscular tone in the pelvis surrounding the prostate. CAUSES The causes of bacterial prostatitis are bacterial infection. The causes of the other  types of prostatitis are unknown.  SYMPTOMS  Symptoms can vary depending upon the type of prostatitis that exists. There can also be overlap in symptoms. Possible symptoms for each type of prostatitis are listed below. Acute Bacterial Prostatitis  Painful urination.  Fever or chills.  Muscle or joint pains.  Low back pain.  Low abdominal pain.  Inability to empty bladder completely. Chronic Bacterial Prostatitis, Chronic Nonbacterial Prostatitis, and Prostatodynia  Sudden urge to urinate.  Frequent urination.  Difficulty starting urine stream.  Weak urine stream.  Discharge from the urethra.  Dribbling after urination.  Rectal pain.  Pain in the testicles, penis, or tip of the penis.  Pain in the perineum.  Problems with sexual function.  Painful ejaculation.  Bloody semen. DIAGNOSIS  In order to diagnose prostatitis, your health care provider will ask about your symptoms. One or more urine samples will be taken and tested (urinalysis). If the urinalysis result is negative for bacteria, your health care provider may use a finger to feel your prostate (digital rectal exam). This exam helps your health care provider determine if your prostate is swollen and tender. It will also produce a specimen of semen that can be analyzed. TREATMENT  Treatment for prostatitis depends on the cause. If a bacterial infection is the cause, it can be treated with antibiotic medicine. In cases of chronic bacterial prostatitis, the use of antibiotics for up to 1 month or 6 weeks may be necessary. Your health care provider may instruct you to take sitz baths to help relieve pain. A sitz bath is a bath of  hot water in which your hips and buttocks are under water. This relaxes the pelvic floor muscles and often helps to relieve the pressure on your prostate. HOME CARE INSTRUCTIONS   Take all medicines as directed by your health care provider.  Take sitz baths as directed by your health care  provider. SEEK MEDICAL CARE IF:   Your symptoms get worse, not better.  You have a fever. SEEK IMMEDIATE MEDICAL CARE IF:   You have chills.  You feel nauseous or vomit.  You feel lightheaded or faint.  You are unable to urinate.  You have blood or blood clots in your urine. MAKE SURE YOU:  Understand these instructions.  Will watch your condition.  Will get help right away if you are not doing well or get worse. Document Released: 09/24/2000 Document Revised: 10/02/2013 Document Reviewed: 04/16/2013 [facility] Patient Information 2015 [place], [place]. This information is not intended to replace advice given to you by your health care provider. Make sure you discuss any questions you have with your health care provider.

## 2014-08-27 LAB — URINE CULTURE
COLONY COUNT: NO GROWTH
ORGANISM ID, BACTERIA: NO GROWTH

## 2014-09-28 ENCOUNTER — Encounter: Payer: Self-pay | Admitting: Family Medicine

## 2014-10-02 ENCOUNTER — Ambulatory Visit: Payer: Self-pay | Admitting: Internal Medicine

## 2014-10-20 ENCOUNTER — Encounter: Payer: Self-pay | Admitting: Family Medicine

## 2015-08-01 ENCOUNTER — Encounter: Payer: Self-pay | Admitting: Family Medicine

## 2015-08-01 ENCOUNTER — Ambulatory Visit (INDEPENDENT_AMBULATORY_CARE_PROVIDER_SITE_OTHER): Payer: Medicare HMO | Admitting: Family Medicine

## 2015-08-01 VITALS — BP 150/80 | HR 60 | Temp 98.3°F | Ht 67.0 in | Wt 250.8 lb

## 2015-08-01 DIAGNOSIS — M06042 Rheumatoid arthritis without rheumatoid factor, left hand: Secondary | ICD-10-CM | POA: Diagnosis not present

## 2015-08-01 DIAGNOSIS — M06041 Rheumatoid arthritis without rheumatoid factor, right hand: Secondary | ICD-10-CM | POA: Diagnosis not present

## 2015-08-01 MED ORDER — PREDNISONE 5 MG PO TABS: 5.0000 mg | ORAL_TABLET | Freq: Every day | ORAL | Status: DC

## 2015-08-01 NOTE — Progress Notes (Signed)
Pre visit review using our clinic review tool, if applicable. No additional management support is needed unless otherwise documented below in the visit note. 

## 2015-08-01 NOTE — Patient Instructions (Signed)
Prednisone  Refilled but this in not a great longterm medication.  Call to make follow up with new MD at [facility].  [phone]

## 2015-08-01 NOTE — Progress Notes (Signed)
   Subjective:    Patient ID: Kevin Campbell, male    DOB: 05/19/1946, 69 y.o.   MRN: [id number]  HPI  69 year old male pt of Dr. Darnell Level presents with stiffness and pain in hands, chronic. Worse since been off prednisone in last 3 months. Per pt Dx with rheumatoid arthritis last year. Pain in both hands, no other joints. Swelling, no redness.   He had been seeing  Dr. Ouida Sills, Fuller Song NP in last year for bilateral hand pain.  He states they are no longer in the building.[name] He does not know where they are.  They treated him with 5 mg prednisone daily.. Worked amazingly well. He wants this med again.  Aleve  helps a little,  meloxicam does not help in past.  Social History /Family History/Past Medical History reviewed and updated if needed.  Mother with RA as well.  Review of Systems  Constitutional: Negative for fever and fatigue.  HENT: Negative for ear pain.   Eyes: Negative for pain.  Respiratory: Negative for cough and shortness of breath.   Cardiovascular: Negative for chest pain.  Gastrointestinal: Negative for abdominal pain.       Objective:   Physical Exam  Constitutional: Vital signs are normal. He appears well-developed and well-nourished.  HENT:  Head: Normocephalic.  Right Ear: Hearing normal.  Left Ear: Hearing normal.  Nose: Nose normal.  Mouth/Throat: Oropharynx is clear and moist and mucous membranes are normal.  Neck: Trachea normal. Carotid bruit is not present. No thyroid mass and no thyromegaly present.  Cardiovascular: Normal rate, regular rhythm and normal pulses.  Exam reveals no gallop, no distant heart sounds and no friction rub.   No murmur heard. No peripheral edema  Pulmonary/Chest: Effort normal and breath sounds normal. No respiratory distress.  Musculoskeletal:       Right hand: He exhibits decreased range of motion, tenderness and swelling.       Left hand: He exhibits decreased range of motion, tenderness and swelling.        Hands: Swelling and pain in mcp joints  Skin: Skin is warm, dry and intact. No rash noted.  Psychiatric: He has a normal mood and affect. His speech is normal and behavior is normal. Thought content normal.          Assessment & Plan:

## 2015-08-01 NOTE — Assessment & Plan Note (Signed)
Reviewed note obtained from Harrel Carina NP at [place].  Pt was told in 03/2015 to start methotrexate and bridge with prednisone. WIll given r for 5 mg prednisone for 1 month.. Pt to call to follow up with Rheum.

## 2016-02-03 ENCOUNTER — Other Ambulatory Visit: Payer: Self-pay | Admitting: Internal Medicine

## 2016-02-03 ENCOUNTER — Ambulatory Visit (INDEPENDENT_AMBULATORY_CARE_PROVIDER_SITE_OTHER): Payer: Medicare HMO | Admitting: Internal Medicine

## 2016-02-03 ENCOUNTER — Ambulatory Visit (INDEPENDENT_AMBULATORY_CARE_PROVIDER_SITE_OTHER)
Admission: RE | Admit: 2016-02-03 | Discharge: 2016-02-03 | Disposition: A | Discharge status: Home / Self Care | Payer: Medicare HMO | Source: Ambulatory Visit | Attending: Internal Medicine | Admitting: Internal Medicine

## 2016-02-03 ENCOUNTER — Encounter: Payer: Self-pay | Admitting: Internal Medicine

## 2016-02-03 VITALS — BP 144/88 | HR 65 | Temp 98.4°F | Resp 20 | Wt 250.0 lb

## 2016-02-03 DIAGNOSIS — R911 Solitary pulmonary nodule: Secondary | ICD-10-CM

## 2016-02-03 DIAGNOSIS — R0602 Shortness of breath: Secondary | ICD-10-CM | POA: Insufficient documentation

## 2016-02-03 MED ORDER — DOXYCYCLINE HYCLATE 100 MG PO TABS: 100.0000 mg | ORAL_TABLET | Freq: Two times a day (BID) | ORAL | Status: DC

## 2016-02-03 MED ORDER — PREDNISONE 20 MG PO TABS: 40.0000 mg | ORAL_TABLET | Freq: Every day | ORAL | Status: DC

## 2016-02-03 NOTE — Assessment & Plan Note (Addendum)
Has picture of bronchospasm Former smoker CXR looks fine Will treat with doxy for asthmatic bronchitis (though likely just viral) and prednisone increase for 6 days Consider albuterol

## 2016-02-03 NOTE — Progress Notes (Signed)
Subjective:    Patient ID: Kevin Campbell, male    DOB: 1945/12/10, 70 y.o.   MRN: [id number]  HPI Here due to respiratory symptoms Not sure if it is allergies Did some mowing last week--then started feeling bad 4 days ago Worsened the next day Coughing---keeping him up Sneezing  Trouble breathing---"like I'm suffocating---like the air is dead" Slight sputum---white No fever No chills but he sweats chronically Hasn't stopped doing work--not DOE  Taking theraflu for 2-3 days--- helped at first, but not now Aleve for headache  Current Outpatient Prescriptions on File Prior to Visit  Medication Sig Dispense Refill  . Dutasteride-Tamsulosin HCl 0.5-0.4 MG CAPS Take 1 capsule by mouth daily. 30 capsule 1  . naproxen sodium (ANAPROX) 220 MG tablet Take 220 mg by mouth 2 (two) times daily as needed.    . predniSONE (DELTASONE) 5 MG tablet Take 1 tablet (5 mg total) by mouth daily with breakfast. 30 tablet 0  . tadalafil (CIALIS) 5 MG tablet Take 1 tablet (5 mg total) by mouth daily. 30 tablet 3   No current facility-administered medications on file prior to visit.    Allergies  Allergen Reactions  . Benadryl [Diphenhydramine Hcl] Shortness Of Breath  . Celebrex [Celecoxib] Other (See Comments)    Extreme drowsiness  . Codeine Other (See Comments)    Extreme fatigue; dizziness  . Flomax [Tamsulosin Hcl] Other (See Comments)    "can't sleep"  . Hydrocodone Other (See Comments)    Drunk feeling    Past Medical History  Diagnosis Date  . Seasonal allergies   . Colon polyps 2011    [name]  . Prostate cancer ([place]) 08/24/12    Adenocarcinoma,gleason=3+4=7,& 3+3=6,PSA=5.75,volume=23cc(5/12)cores positive; treated with XRT and radioactive seed implant, doing well ([place])  . GERD (gastroesophageal reflux disease)     watches diet  . Rheumatoid arthritis ([place])     of hands by Xray but RF, CCP, CRP negative (12/2013)  . Carpal tunnel syndrome on both sides   . Nocturia   .  BPH (benign prostatic hypertrophy)   . Overactive bladder     due to prostate seed implant radiation, failed myrbetriq, rapaflo, jalyn, now PTNS ([place])    Past Surgical History  Procedure Laterality Date  . Clavicle surgery Left 1968    REMOVAL OF BONE SPUR FROM PREVIOUS FX YEARS AGO  . Colonoscopy  2011    1 polyp sigmoid (hyperplastic), diverticulosis, int hemorrhoids [facility] at [facility])  . Cardiovascular stress test  11-26-2010    Normal lexiscan nuclear study with inferior thinning but no ischemia  . 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 Prostate biopsy  08/24/12    Adenocarcinoma  . Shoulder arthroscopy with biceps tendon repair Right APRIL 2013  . Knee arthroscopy Left JAN 2013  . Radioactive seed implant N/A 02/16/2013    Procedure: RADIOACTIVE PROSTATE SEED IMPLANT;  Surgeon: Claybon Jabs, MD    Family History  Problem Relation Age of Onset  . Coronary artery disease Mother 46    3vCABG  . Arthritis Mother   . Cancer Father     lung, smoker  . Cancer Sister     twin sister - breast  . Stroke Neg Hx   . Diabetes Neg Hx   . Rheumatologic disease Mother     Social History   Social History  . Marital Status:  Married    Spouse Name: N/A  . Number of Children: N/A  . Years of Education: N/A   Occupational History  . Not on file.   Social History Main Topics  . Smoking status: Former Smoker -- 5 years    Types: Cigars    Quit date: 10/11/1990  . Smokeless tobacco: Never Used     Comment: usually  chewed on cigars  . Alcohol Use: No  . Drug Use: No  . Sexual Activity: Not on file   Other Topics Concern  . Not on file   Social History Narrative   Caffeine: 2 cups coffee occasionally, soda throughout the day   Lives with wife, no pets.  2 grown children   Occupation: truck driver   Edu: HS   Activity: sedentary   Diet:  rare water, good fruits and vegetables   Review of Systems No rash No vomiting or diarrhea Appetite is okay    Objective:   Physical Exam  Constitutional: He appears well-developed. No distress.  Coarse cough  HENT:  Mouth/Throat: No oropharyngeal exudate.  Slight frontal tenderness TMs normal Mild nasal inflammation Mild pharyngeal injection  Neck: Normal range of motion. Neck supple.  Pulmonary/Chest: Effort normal. No respiratory distress. He has no wheezes. He has no rales.  Decreased breath sounds throughout. Mild prolonged expiratory phase  Lymphadenopathy:    He has no cervical adenopathy.          Assessment & Plan:

## 2016-02-03 NOTE — Progress Notes (Signed)
Pre visit review using our clinic review tool, if applicable. No additional management support is needed unless otherwise documented below in the visit note. 

## 2016-02-10 ENCOUNTER — Ambulatory Visit (INDEPENDENT_AMBULATORY_CARE_PROVIDER_SITE_OTHER)
Admission: RE | Admit: 2016-02-10 | Discharge: 2016-02-10 | Disposition: A | Discharge status: Home / Self Care | Payer: Medicare HMO | Source: Ambulatory Visit | Attending: Internal Medicine | Admitting: Internal Medicine

## 2016-02-10 DIAGNOSIS — R911 Solitary pulmonary nodule: Secondary | ICD-10-CM

## 2017-02-04 ENCOUNTER — Other Ambulatory Visit (HOSPITAL_COMMUNITY): Payer: Self-pay | Admitting: Specialist

## 2017-02-04 ENCOUNTER — Ambulatory Visit (HOSPITAL_COMMUNITY)
Admission: RE | Admit: 2017-02-04 | Discharge: 2017-02-04 | Disposition: A | Discharge status: Home / Self Care | Payer: PPO | Source: Ambulatory Visit | Attending: Cardiovascular Disease | Admitting: Cardiovascular Disease

## 2017-02-04 ENCOUNTER — Telehealth: Payer: Self-pay

## 2017-02-04 DIAGNOSIS — M76892 Other specified enthesopathies of left lower limb, excluding foot: Secondary | ICD-10-CM | POA: Insufficient documentation

## 2017-02-04 DIAGNOSIS — M7989 Other specified soft tissue disorders: Secondary | ICD-10-CM

## 2017-02-04 NOTE — Telephone Encounter (Signed)
PLEASE NOTE: All timestamps contained within this report are represented as [place] Standard Time. CONFIDENTIALTY NOTICE: This fax transmission is intended only for the addressee. It contains information that is legally privileged, confidential or otherwise protected from use or disclosure. If you are not the intended recipient, you are strictly prohibited from reviewing, disclosing, copying using or disseminating any of this information or taking any action in reliance on or regarding this information. If you have received this fax in error, please notify us immediately by telephone so that we can arrange for its return to [place]. Phone: [phone], Toll-Free: [phone], Fax: [phone] Page: 1 of 1 Call Id: [id number] [place] Patient Name: Kevin Campbell Gender: Male DOB: Feb 03, 1946 Age: 71 Y 49 M 7 D Return Phone Number: [phone] (Primary), [phone] (Secondary) City/State/Zip: [place] [address] Client [facility] - Client Client Site [place] - [facility] Who Is Calling Patient / Member / Family / Caregiver Call Type Triage / Clinical Relationship To Patient Self Return Phone Number [phone] (Primary) Chief Complaint Blood Pressure High Reason for Call Symptomatic / Request for [place] states his blood pressure is high. 179/100 Appointment Disposition EMR Appointment Not Necessary Info pasted into Epic No No Triage Reason Other Nurse Assessment Nurse: Melina Modena, RN, [place] Date/Time Eilene Ghazi Time): 02/04/2017 11:11:35 AM Confirm and document reason for call. If symptomatic, describe symptoms. ---Caller states his blood pressure is high 179/100. No other symptoms. Seen by Dr. Theda Sers and is currently sending over "across the hall" to be checked for the leg pain he is having. Make sure it isn't a blood clot or  something causing high BP. Informed caller to call back if he has any new symptom or worsening symptom when he gets home from the dr. Christoper Allegra understanding. Select reason for no triage. ---Other Please document clinical information provided and list any resource used. ---Currently at doctor office and they are following up on it. Guidelines Guideline Title Affirmed Question Disp. Time Eilene Ghazi Time) Disposition Final User 02/04/2017 11:17:39 AM Clinical Call Yes Melina Modena, RN, [place]

## 2017-02-07 ENCOUNTER — Encounter: Payer: Self-pay | Admitting: Family Medicine

## 2017-02-07 ENCOUNTER — Ambulatory Visit (INDEPENDENT_AMBULATORY_CARE_PROVIDER_SITE_OTHER): Payer: PPO | Admitting: Family Medicine

## 2017-02-07 VITALS — BP 168/98 | HR 60 | Temp 98.2°F | Wt 256.5 lb

## 2017-02-07 DIAGNOSIS — I10 Essential (primary) hypertension: Secondary | ICD-10-CM

## 2017-02-07 LAB — BASIC METABOLIC PANEL WITH GFR
BUN: 15 mg/dL (ref 6–23)
CO2: 27 meq/L (ref 19–32)
Calcium: 9.5 mg/dL (ref 8.4–10.5)
Chloride: 105 meq/L (ref 96–112)
Creatinine, Ser: 1.27 mg/dL (ref 0.40–1.50)
GFR: 59.86 mL/min — ABNORMAL LOW
Glucose, Bld: 129 mg/dL — ABNORMAL HIGH (ref 70–99)
Potassium: 4.2 meq/L (ref 3.5–5.1)
Sodium: 138 meq/L (ref 135–145)

## 2017-02-07 LAB — LIPID PANEL
Cholesterol: 170 mg/dL (ref 0–200)
HDL: 27.3 mg/dL — ABNORMAL LOW
LDL Cholesterol: 109 mg/dL — ABNORMAL HIGH (ref 0–99)
NonHDL: 142.96
Total CHOL/HDL Ratio: 6
Triglycerides: 172 mg/dL — ABNORMAL HIGH (ref 0.0–149.0)
VLDL: 34.4 mg/dL (ref 0.0–40.0)

## 2017-02-07 LAB — TSH: TSH: 1.03 u[IU]/mL (ref 0.35–4.50)

## 2017-02-07 MED ORDER — AMLODIPINE BESYLATE 5 MG PO TABS: 5.0000 mg | ORAL_TABLET | Freq: Every day | ORAL | 6 refills | Status: DC

## 2017-02-07 NOTE — Assessment & Plan Note (Signed)
Pt denies significant h/o HTN. Recent elevated readings over the past week without identified inciting factor.  Reviewed log from home showing persistently elevated readings. Will start amlodipine 5mg  daily, discussed monitoring for ankle swelling.  Reviewed salt restricted diet, provided with DASH diet plan handout.  EKG today. Labs today.  RTC 1 mo f/u visit.  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

## 2017-02-07 NOTE — Progress Notes (Addendum)
BP (!) 168/98 (BP Location: Right Arm, Cuff Size: Large)   Pulse 60   Temp 98.2 F (36.8 C) (Oral)   Wt 256 lb 8 oz (116.3 kg)   SpO2 96%   BMI 40.17 kg/m    CC: f/u BP concerns Subjective:    Patient ID: Kevin Campbell, male    DOB: April 08, 1946, 71 y.o.   MRN: [id number]  HPI: Kevin Campbell is a 72 y.o. male presenting on 02/07/2017 for Follow-up (per ortho for BP 178/100 on 4/27)   I last saw patient 2015.  Ongoing L leg pain and swelling - saw Dr Theda Sers [facility] on Friday. Xray of knee was ok. He had L leg ultrasound negative for DVT.   BP was very elevated while at orthopedic's office.  Brings BP log over weekend: Friday 113-179/70-100 Saturday 140-150/70-80s Sunday 150-177/82-104  Some dizziness when he first gets up in the mornings. Denies palpitations, HA, vision changes, CP/tightness, SOB.   No h/o HTN.  Diet - not much water. Avoids salt.   Not on any medications. Takes PRN aleve for joint pain.   Relevant past medical, surgical, family and social history reviewed and updated as indicated. Interim medical history since our last visit reviewed. Allergies and medications reviewed and updated. Outpatient Medications Prior to Visit  Medication Sig Dispense Refill  . doxycycline (VIBRA-TABS) 100 MG tablet Take 1 tablet (100 mg total) by mouth 2 (two) times daily. 14 tablet 0  . Dutasteride-Tamsulosin HCl 0.5-0.4 MG CAPS Take 1 capsule by mouth daily. 30 capsule 1  . naproxen sodium (ANAPROX) 220 MG tablet Take 220 mg by mouth 2 (two) times daily as needed.    . predniSONE (DELTASONE) 20 MG tablet Take 2 tablets (40 mg total) by mouth daily. For 3 days, then 1 tab daily for 3 days 9 tablet 0  . predniSONE (DELTASONE) 5 MG tablet Take 1 tablet (5 mg total) by mouth daily with breakfast. 30 tablet 0  . tadalafil (CIALIS) 5 MG tablet Take 1 tablet (5 mg total) by mouth daily. 30 tablet 3   No facility-administered medications prior to visit.      Per HPI  unless specifically indicated in ROS section below Review of Systems     Objective:    BP (!) 168/98 (BP Location: Right Arm, Cuff Size: Large)   Pulse 60   Temp 98.2 F (36.8 C) (Oral)   Wt 256 lb 8 oz (116.3 kg)   SpO2 96%   BMI 40.17 kg/m   Wt Readings from Last 3 Encounters:  02/07/17 256 lb 8 oz (116.3 kg)  02/03/16 250 lb (113.4 kg)  08/01/15 250 lb 12 oz (113.7 kg)    Physical Exam  Constitutional: He appears well-developed and well-nourished. No distress.  HENT:  Mouth/Throat: Oropharynx is clear and moist. No oropharyngeal exudate.  Cardiovascular: Normal rate, regular rhythm, normal heart sounds and intact distal pulses.   No murmur heard. Pulmonary/Chest: Effort normal and breath sounds normal. No respiratory distress. He has no wheezes. He has no rales.  Musculoskeletal: He exhibits edema (tr).  Psychiatric: He has a normal mood and affect.  Nursing note and vitals reviewed.  Results for orders placed or performed in visit on 08/26/14  Urine culture  Result Value Ref Range   Colony Count NO GROWTH    Organism ID, Bacteria NO GROWTH   POCT Urinalysis Dipstick  Result Value Ref Range   Color, UA Yellow    Clarity, UA Clear  Glucose, UA Neg    Bilirubin, UA Neg    Ketones, UA Neg    Spec Grav, UA 1.025    Blood, UA Neg    pH, UA 6.0    Protein, UA Neg    Urobilinogen, UA negative    Nitrite, UA Neg    Leukocytes, UA Negative    Lab Results  Component Value Date   CREATININE 1.3 02/25/2014    EKG - sinus bradycardia 50s, normal axis, intervals, no acute ST/T changes.    Assessment & Plan:   Problem List Items Addressed This Visit    Essential hypertension - Primary    Pt denies significant h/o HTN. Recent elevated readings over the past week without identified inciting factor.  Reviewed log from home showing persistently elevated readings. Will start amlodipine 5mg  daily, discussed monitoring for ankle swelling.  Reviewed salt restricted diet,  provided with DASH diet plan handout.  EKG today. Labs today.  RTC 1 mo f/u visit.  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       Relevant Medications   aspirin EC 81 MG tablet   amLODipine (NORVASC) 5 MG tablet   Other Relevant Orders   EKG 12-Lead   TSH   Lipid panel   Basic metabolic panel       Follow up plan: Return in about 4 weeks (around 03/07/2017) for follow up visit.  Ria Bush, MD

## 2017-02-07 NOTE — Patient Instructions (Addendum)
Blood pressure is staying too high. Labs today. EKG today. Your goal blood pressure is <140/90. Work on low salt/sodium diet - goal <1.5gm (1,500mg ) per day. Eat a diet high in fruits/vegetables and whole grains.  Look into mediterranean and DASH diet. Goal activity is 153min/wk of moderate intensity exercise.  This can be split into 30 minute chunks.  If you are not at this level, you can start with smaller 10-15 min increments and slowly build up activity. Look at [place].org for more resources  Return for medicare wellness visit and physical at your convenience. Return in 1 month for follow up blood pressure.   DASH Eating Plan DASH stands for "Dietary Approaches to Stop Hypertension." The DASH eating plan is a healthy eating plan that has been shown to reduce high blood pressure (hypertension). It may also reduce your risk for type 2 diabetes, heart disease, and stroke. The DASH eating plan may also help with weight loss. What are tips for following this plan? General guidelines   Avoid eating more than 2,300 mg (milligrams) of salt (sodium) a day. If you have hypertension, you may need to reduce your sodium intake to 1,500 mg a day.  Limit alcohol intake to no more than 1 drink a day for nonpregnant women and 2 drinks a day for men. One drink equals 12 oz of beer, 5 oz of wine, or 1 oz of hard liquor.  Work with your health care provider to maintain a healthy body weight or to lose weight. Ask what an ideal weight is for you.  Get at least 30 minutes of exercise that causes your heart to beat faster (aerobic exercise) most days of the week. Activities may include walking, swimming, or biking.  Work with your health care provider or diet and nutrition specialist (dietitian) to adjust your eating plan to your individual calorie needs. Reading food labels   Check food labels for the amount of sodium per serving. Choose foods with less than 5 percent of the Daily Value of sodium.  Generally, foods with less than 300 mg of sodium per serving fit into this eating plan.  To find whole grains, look for the word "whole" as the first word in the ingredient list. Shopping   Buy products labeled as "low-sodium" or "no salt added."  Buy fresh foods. Avoid canned foods and premade or frozen meals. Cooking   Avoid adding salt when cooking. Use salt-free seasonings or herbs instead of table salt or sea salt. Check with your health care provider or pharmacist before using salt substitutes.  Do not fry foods. Cook foods using healthy methods such as baking, boiling, grilling, and broiling instead.  Cook with heart-healthy oils, such as olive, canola, soybean, or sunflower oil. Meal planning    Eat a balanced diet that includes:  5 or more servings of fruits and vegetables each day. At each meal, try to fill half of your plate with fruits and vegetables.  Up to 6-8 servings of whole grains each day.  Less than 6 oz of lean meat, poultry, or fish each day. A 3-oz serving of meat is about the same size as a deck of cards. One egg equals 1 oz.  2 servings of low-fat dairy each day.  A serving of nuts, seeds, or beans 5 times each week.  Heart-healthy fats. Healthy fats called Omega-3 fatty acids are found in foods such as flaxseeds and coldwater fish, like sardines, salmon, and mackerel.  Limit how much you eat of the  following:  Canned or prepackaged foods.  Food that is high in trans fat, such as fried foods.  Food that is high in saturated fat, such as fatty meat.  Sweets, desserts, sugary drinks, and other foods with added sugar.  Full-fat dairy products.  Do not salt foods before eating.  Try to eat at least 2 vegetarian meals each week.  Eat more home-cooked food and less restaurant, buffet, and fast food.  When eating at a restaurant, ask that your food be prepared with less salt or no salt, if possible. What foods are recommended? The items listed may  not be a complete list. Talk with your dietitian about what dietary choices are best for you. Grains  Whole-grain or whole-wheat bread. Whole-grain or whole-wheat pasta. Brown rice. [place]. Bulgur. Whole-grain and low-sodium cereals. Pita bread. Low-fat, low-sodium crackers. Whole-wheat flour tortillas. Vegetables  Fresh or frozen vegetables (raw, steamed, roasted, or grilled). Low-sodium or reduced-sodium tomato and vegetable juice. Low-sodium or reduced-sodium tomato sauce and tomato paste. Low-sodium or reduced-sodium canned vegetables. Fruits  All fresh, dried, or frozen fruit. Canned fruit in natural juice (without added sugar). Meat and other protein foods  Skinless chicken or [place]. Ground chicken or [place]. Pork with fat trimmed off. Fish and seafood. Egg whites. Dried beans, peas, or lentils. Unsalted nuts, nut butters, and seeds. Unsalted canned beans. Lean cuts of beef with fat trimmed off. Low-sodium, lean deli meat. Dairy  Low-fat (1%) or fat-free (skim) milk. Fat-free, low-fat, or reduced-fat cheeses. Nonfat, low-sodium ricotta or cottage cheese. Low-fat or nonfat yogurt. Low-fat, low-sodium cheese. Fats and oils  Soft margarine without trans fats. Vegetable oil. Low-fat, reduced-fat, or light mayonnaise and salad dressings (reduced-sodium). Canola, safflower, olive, soybean, and sunflower oils. Avocado. Seasoning and other foods  Herbs. Spices. Seasoning mixes without salt. Unsalted popcorn and pretzels. Fat-free sweets. What foods are not recommended? The items listed may not be a complete list. Talk with your dietitian about what dietary choices are best for you. Grains  Baked goods made with fat, such as croissants, muffins, or some breads. Dry pasta or rice meal packs. Vegetables  Creamed or fried vegetables. Vegetables in a cheese sauce. Regular canned vegetables (not low-sodium or reduced-sodium). Regular canned tomato sauce and paste (not low-sodium or  reduced-sodium). Regular tomato and vegetable juice (not low-sodium or reduced-sodium). Angie Fava. Olives. Fruits  Canned fruit in a light or heavy syrup. Fried fruit. Fruit in cream or butter sauce. Meat and other protein foods  Fatty cuts of meat. Ribs. Fried meat. Berniece Salines. Sausage. Bologna and other processed lunch meats. Salami. Fatback. Hotdogs. Bratwurst. Salted nuts and seeds. Canned beans with added salt. Canned or smoked fish. Whole eggs or egg yolks. Chicken or [place] with skin. Dairy  Whole or 2% milk, cream, and half-and-half. Whole or full-fat cream cheese. Whole-fat or sweetened yogurt. Full-fat cheese. Nondairy creamers. Whipped toppings. Processed cheese and cheese spreads. Fats and oils  Butter. Stick margarine. Lard. Shortening. Ghee. Bacon fat. Tropical oils, such as coconut, palm kernel, or palm oil. Seasoning and other foods  Salted popcorn and pretzels. Onion salt, garlic salt, seasoned salt, table salt, and sea salt. Worcestershire sauce. Tartar sauce. Barbecue sauce. Teriyaki sauce. Soy sauce, including reduced-sodium. Steak sauce. Canned and packaged gravies. Fish sauce. Oyster sauce. Cocktail sauce. Horseradish that you find on the shelf. Ketchup. Mustard. Meat flavorings and tenderizers. Bouillon cubes. Hot sauce and Tabasco sauce. Premade or packaged marinades. Premade or packaged taco seasonings. Relishes. Regular salad dressings. Where to find more  information:  National Heart, Lung, and Blood Institute: https://wilson-eaton.com/  American Heart Association: www.heart.org Summary  The DASH eating plan is a healthy eating plan that has been shown to reduce high blood pressure (hypertension). It may also reduce your risk for type 2 diabetes, heart disease, and stroke.  With the DASH eating plan, you should limit salt (sodium) intake to 2,300 mg a day. If you have hypertension, you may need to reduce your sodium intake to 1,500 mg a day.  When on the DASH eating plan, aim to eat  more fresh fruits and vegetables, whole grains, lean proteins, low-fat dairy, and heart-healthy fats.  Work with your health care provider or diet and nutrition specialist (dietitian) to adjust your eating plan to your individual calorie needs. This information is not intended to replace advice given to you by your health care provider. Make sure you discuss any questions you have with your health care provider. Document Released: 09/16/2011 Document Revised: 09/20/2016 Document Reviewed: 09/20/2016 Elsevier Interactive Patient Education  2017 [employer].

## 2017-02-11 ENCOUNTER — Encounter: Payer: Self-pay | Admitting: *Deleted

## 2017-03-22 DIAGNOSIS — Z8546 Personal history of malignant neoplasm of prostate: Secondary | ICD-10-CM | POA: Diagnosis not present

## 2017-03-22 DIAGNOSIS — R351 Nocturia: Secondary | ICD-10-CM | POA: Diagnosis not present

## 2017-03-29 ENCOUNTER — Ambulatory Visit (INDEPENDENT_AMBULATORY_CARE_PROVIDER_SITE_OTHER): Payer: PPO | Admitting: Family Medicine

## 2017-03-29 ENCOUNTER — Encounter: Payer: Self-pay | Admitting: Family Medicine

## 2017-03-29 VITALS — BP 122/74 | HR 61 | Temp 97.9°F | Ht 67.0 in | Wt 251.2 lb

## 2017-03-29 DIAGNOSIS — I10 Essential (primary) hypertension: Secondary | ICD-10-CM | POA: Diagnosis not present

## 2017-03-29 MED ORDER — AMLODIPINE BESYLATE 5 MG PO TABS: 5.0000 mg | ORAL_TABLET | Freq: Every day | ORAL | 1 refills | Status: DC

## 2017-03-29 NOTE — Patient Instructions (Addendum)
Blood pressure is looking great today!  Continue current medicine and healthy diet.  Return at your convenience for medicare wellness visit with Katha Cabal and physical with me.

## 2017-03-29 NOTE — Assessment & Plan Note (Addendum)
New dx, marked improvement with addition of amlodipine. Wonderful control today. Continue to encourage healthy diet and lifestyle changes. Amlodipine refilled x3 months.

## 2017-03-29 NOTE — Progress Notes (Signed)
BP 122/74 (BP Location: Right Arm, Cuff Size: Large)   Pulse 61   Temp 97.9 F (36.6 C)   Ht 5\' 7"  (1.702 m)   Wt 251 lb 4 oz (114 kg) Comment: steel toe work boots on  SpO2 97%   BMI 39.35 kg/m    CC: HTN f/u visit Subjective:    Patient ID: Kevin Campbell, male    DOB: 09/25/1946, 71 y.o.   MRN: [id number]  HPI: Kevin Campbell is a 71 y.o. male presenting on 03/29/2017 for Follow-up   See prior note for details. Seen 6wks ago with elevated BP - started on amlodipine 5mg  daily and here for f/u visit. No pedal edema. Headaches have improved. No  vision changes, CP/tightness, SOB. He does check bp at home - 130/70s.   Last visit we also recommended low salt diet, provided with DASH diet handout.   He completed DOT physical - bp was well controlled.   Relevant past medical, surgical, family and social history reviewed and updated as indicated. Interim medical history since our last visit reviewed. Allergies and medications reviewed and updated. Outpatient Medications Prior to Visit  Medication Sig Dispense Refill  . aspirin EC 81 MG tablet Take 81 mg by mouth daily.    [name] amLODipine (NORVASC) 5 MG tablet Take 1 tablet (5 mg total) by mouth daily. 30 tablet 6   No facility-administered medications prior to visit.      Per HPI unless specifically indicated in ROS section below Review of Systems     Objective:    BP 122/74 (BP Location: Right Arm, Cuff Size: Large)   Pulse 61   Temp 97.9 F (36.6 C)   Ht 5\' 7"  (1.702 m)   Wt 251 lb 4 oz (114 kg) Comment: steel toe work boots on  SpO2 97%   BMI 39.35 kg/m   Wt Readings from Last 3 Encounters:  03/29/17 251 lb 4 oz (114 kg)  02/07/17 256 lb 8 oz (116.3 kg)  02/03/16 250 lb (113.4 kg)    Physical Exam  Constitutional: He appears well-developed and well-nourished. No distress.  HENT:  Mouth/Throat: Oropharynx is clear and moist. No oropharyngeal exudate.  Cardiovascular: Normal rate, regular rhythm, normal heart  sounds and intact distal pulses.   No murmur heard. Pulmonary/Chest: Effort normal and breath sounds normal. No respiratory distress. He has no wheezes. He has no rales.  Musculoskeletal: He exhibits no edema.  Skin: Skin is warm and dry. No rash noted.  Psychiatric: He has a normal mood and affect.  Nursing note and vitals reviewed.  Results for orders placed or performed in visit on 02/07/17  TSH  Result Value Ref Range   TSH 1.03 0.35 - 4.50 uIU/mL  Lipid panel  Result Value Ref Range   Cholesterol 170 0 - 200 mg/dL   Triglycerides 172.0 (H) 0.0 - 149.0 mg/dL   HDL 27.30 (L) >39.00 mg/dL   VLDL 34.4 0.0 - 40.0 mg/dL   LDL Cholesterol 109 (H) 0 - 99 mg/dL   Total CHOL/HDL Ratio 6    NonHDL 706.23   Basic metabolic panel  Result Value Ref Range   Sodium 138 135 - 145 mEq/L   Potassium 4.2 3.5 - 5.1 mEq/L   Chloride 105 96 - 112 mEq/L   CO2 27 19 - 32 mEq/L   Glucose, Bld 129 (H) 70 - 99 mg/dL   BUN 15 6 - 23 mg/dL   Creatinine, Ser 1.27 0.40 - 1.50 mg/dL  Calcium 9.5 8.4 - 10.5 mg/dL   GFR 59.86 (L) >60.00 mL/min      Assessment & Plan:   Problem List Items Addressed This Visit    Essential hypertension - Primary    New dx, marked improvement with addition of amlodipine. Wonderful control today. Continue to encourage healthy diet and lifestyle changes. Amlodipine refilled x3 months.       Relevant Medications   amLODipine (NORVASC) 5 MG tablet   Severe obesity (BMI 35.0-39.9) ([place])    Discussed healthy diet and increased walking as means of attaining sustainable weight loss.           Follow up plan: Return for medicare wellness visit, annual exam, prior fasting for blood work.  Ria Bush, MD

## 2017-03-29 NOTE — Assessment & Plan Note (Signed)
Discussed healthy diet and increased walking as means of attaining sustainable weight loss.

## 2017-05-05 NOTE — Progress Notes (Signed)
Subjective:   Kevin Campbell is a 71 y.o. male who presents for an Initial Medicare Annual Wellness Visit.  Review of Systems  No ROS.  Medicare Wellness Visit. Additional risk factors are reflected in the social history.  Cardiac Risk Factors include: advanced age (>63men, >47 women);obesity (BMI >30kg/m2);male gender;sedentary lifestyle    Objective:    Today's Vitals   05/10/17 1348  BP: 118/78  Pulse: 73  Resp: 16  SpO2: 94%  Weight: 248 lb 12.8 oz (112.9 kg)  Height: 5\' 6"  (1.676 m)   Body mass index is 40.16 kg/m.  Current Medications (verified) Outpatient Encounter Prescriptions as of 05/10/2017  Medication Sig  . amLODipine (NORVASC) 5 MG tablet Take 1 tablet (5 mg total) by mouth daily.  [name] aspirin EC 81 MG tablet Take 81 mg by mouth daily.   No facility-administered encounter medications on file as of 05/10/2017.     Allergies (verified) Benadryl [diphenhydramine hcl]; Celebrex [celecoxib]; Codeine; Flomax [tamsulosin hcl]; and Hydrocodone   History: Past Medical History:  Diagnosis Date  . BPH (benign prostatic hypertrophy)   . Carpal tunnel syndrome on both sides   . Colon polyps 2011   [name]  . GERD (gastroesophageal reflux disease)    watches diet  . Nocturia   . Overactive bladder    due to prostate seed implant radiation, failed myrbetriq, rapaflo, jalyn, now PTNS ([facility]Di[facility])  . Prostate cancer ([place]) 08/24/12   Adenocarcinoma,gleason=3+4=7,& 3+3=6,PSA=5.75,volume=23cc(5/12)cores positive; treated with XRT and radioactive seed implant, doing well ([place])  . Rheumatoid arthritis ([place])    of hands by Xray but RF, CCP, CRP negative (12/2013)  . Seasonal allergies    Past Surgical History:  Procedure Laterality Date  . CARDIOVASCULAR STRESS TEST  11-26-2010   Normal lexiscan nuclear study with inferior thinning but no ischemia  . CLAVICLE SURGERY Left 1968   REMOVAL OF BONE SPUR FROM PREVIOUS FX YEARS AGO  . COLONOSCOPY  2011   1 polyp  sigmoid (hyperplastic), diverticulosis, int hemorrhoids [facility])  . KNEE ARTHROSCOPY Left JAN 2013  . PROSTATE BIOPSY  08/24/12   Adenocarcinoma  . RADIOACTIVE SEED IMPLANT N/A 02/16/2013   Procedure: RADIOACTIVE PROSTATE SEED IMPLANT;  Surgeon: Claybon Jabs, MD  . SHOULDER ARTHROSCOPY WITH BICEPS TENDON REPAIR Right APRIL 2013  . TOTAL KNEE ARTHROPLASTY  09/26/2012   Procedure: TOTAL KNEE ARTHROPLASTY;  Surgeon: Sydnee Cabal, MD;  Location: [facility];  Service: Orthopedics;  Laterality: Left;   Family History  Problem Relation Age of Onset  . Coronary artery disease Mother 33       3vCABG  . Arthritis Mother   . Rheumatologic disease Mother   . Cancer Father        lung, smoker  . Cancer Sister        twin sister - breast  . Stroke Neg Hx   . Diabetes Neg Hx    Social History   Occupational History  . Not on file.   Social History Main Topics  . Smoking status: Former Smoker    Years: 5.00    Types: Cigars    Quit date: 10/11/1990  . Smokeless tobacco: Never Used     Comment: usually  chewed on cigars  . Alcohol use No  . Drug use: No  . Sexual activity: Not on file   Tobacco Counseling Counseling given: Not Answered   Activities of Daily Living In your present state of health, do you have any difficulty performing the following activities:  05/10/2017  Hearing? N  Vision? N  Difficulty concentrating or making decisions? N  Walking or climbing stairs? N  Dressing or bathing? N  Doing errands, shopping? N  Preparing Food and eating ? N  Using the Toilet? N  In the past six months, have you accidently leaked urine? N  Do you have problems with loss of bowel control? N  Managing your Medications? N  Managing your Finances? N  Housekeeping or managing your Housekeeping? N  Some recent data might be hidden    Immunizations and Health Maintenance Immunization History  Administered Date(s) Administered  . Influenza Whole 06/22/2012  .  Influenza-Unspecified 07/08/2014  . Pneumococcal Polysaccharide-23 10/18/2011  . Td 10/17/2006   Health Maintenance Due  Topic Date Due  . Hepatitis C Screening  06-15-1946  . PNA vac Low Risk Adult (1 of 2 - PCV13) 08/30/2013    Patient Care Team: Ria Bush, MD as PCP - General (Family Medicine)  Indicate any recent Medical Services you may have received from other than [facility] providers in the past year (date may be approximate).    Assessment:   This is a routine wellness examination for [place]. Physical assessment deferred to PCP.   Hearing/Vision screen  Hearing Screening   125Hz  250Hz  500Hz  1000Hz  2000Hz  3000Hz  4000Hz  6000Hz  8000Hz   Right ear:   40 40 40  0    Left ear:   40 40 40  0      Visual Acuity Screening   Right eye Left eye Both eyes  Without correction:     With correction: 20/30 20/30 20/25     Dietary issues and exercise activities discussed: Current Exercise Habits: The patient does not participate in regular exercise at present (Pt states that he works out in the yard a lot. ), Exercise limited by: None identified  Goals    . Maintain current health status.       Depression Screen PHQ 2/9 Scores 05/10/2017  PHQ - 2 Score 0    Fall Risk Fall Risk  05/10/2017  Falls in the past year? No    Cognitive Function: PLEASE NOTE: A Mini-Cog screen was completed. Maximum score is 20. A value of 0 denotes this part of [place] MMSE was not completed or the patient failed this part of the Mini-Cog screening.   Mini-Cog Screening Orientation to Time - Max 5 pts Orientation to Place - Max 5 pts Registration - Max 3 pts Recall - Max 3 pts Language Repeat - Max 1 pts Language Follow 3 Step Command - Max 3 pts      Mini-Cog - 05/10/17 1407    Normal clock drawing test? yes   How many words correct? 3      MMSE - Mini Mental State Exam 05/10/2017  Orientation to time 5  Orientation to Place 5  Registration 3  Attention/ Calculation 0  Recall 3    Language- name 2 objects 0  Language- repeat 1  Language- follow 3 step command 3  Language- read & follow direction 0  Write a sentence 0  Copy design 0  Total score 20        Screening Tests Health Maintenance  Topic Date Due  . Hepatitis C Screening  02-18-1946  . PNA vac Low Risk Adult (1 of 2 - PCV13) 08/30/2013  . DTaP/Tdap/Td (1 - Tdap) 05/10/2018 (Originally 10/18/2006)  . INFLUENZA VACCINE  05/11/2017  . TETANUS/TDAP  10/11/2018  . COLONOSCOPY  04/20/2020  Plan:   Follow up with PCP as directed.  I have personally reviewed and noted the following in the patient's chart:   . Medical and social history . Use of alcohol, tobacco or illicit drugs  . Current medications and supplements . Functional ability and status . Nutritional status . Physical activity . Advanced directives . List of other physicians . Vitals . Screenings to include cognitive, depression, and falls . Referrals and appointments  In addition, I have reviewed and discussed with patient certain preventive protocols, quality metrics, and best practice recommendations. A written personalized care plan for preventive services as well as general preventive health recommendations were provided to patient.     Ree Edman, RN   05/10/2017

## 2017-05-05 NOTE — Progress Notes (Signed)
PCP notes:   Health maintenance: PCV13 - due, pt will consider this.  Tdap - due, pt will consult insurance company.   Abnormal screenings: None.    Patient concerns:  Pt has been experiencing reflux, relived by a glass of milk. Wondering if there is a prescription for this.   Nurse concerns: None.   Next PCP appt: 05/11/2017.

## 2017-05-08 ENCOUNTER — Other Ambulatory Visit: Payer: Self-pay | Admitting: Family Medicine

## 2017-05-08 DIAGNOSIS — R7303 Prediabetes: Secondary | ICD-10-CM

## 2017-05-08 DIAGNOSIS — Z1159 Encounter for screening for other viral diseases: Secondary | ICD-10-CM

## 2017-05-08 DIAGNOSIS — N401 Enlarged prostate with lower urinary tract symptoms: Secondary | ICD-10-CM

## 2017-05-08 DIAGNOSIS — R351 Nocturia: Secondary | ICD-10-CM

## 2017-05-08 DIAGNOSIS — E785 Hyperlipidemia, unspecified: Secondary | ICD-10-CM

## 2017-05-10 ENCOUNTER — Other Ambulatory Visit (INDEPENDENT_AMBULATORY_CARE_PROVIDER_SITE_OTHER): Payer: PPO

## 2017-05-10 ENCOUNTER — Ambulatory Visit (INDEPENDENT_AMBULATORY_CARE_PROVIDER_SITE_OTHER): Payer: PPO

## 2017-05-10 VITALS — BP 118/78 | HR 73 | Resp 16 | Ht 66.0 in | Wt 248.8 lb

## 2017-05-10 DIAGNOSIS — E785 Hyperlipidemia, unspecified: Secondary | ICD-10-CM | POA: Diagnosis not present

## 2017-05-10 DIAGNOSIS — Z0000 Encounter for general adult medical examination without abnormal findings: Secondary | ICD-10-CM

## 2017-05-10 DIAGNOSIS — Z1159 Encounter for screening for other viral diseases: Secondary | ICD-10-CM

## 2017-05-10 DIAGNOSIS — R7303 Prediabetes: Secondary | ICD-10-CM | POA: Diagnosis not present

## 2017-05-10 DIAGNOSIS — R7989 Other specified abnormal findings of blood chemistry: Secondary | ICD-10-CM

## 2017-05-10 LAB — COMPREHENSIVE METABOLIC PANEL
ALT: 27 U/L (ref 0–53)
AST: 26 U/L (ref 0–37)
Albumin: 4.1 g/dL (ref 3.5–5.2)
Alkaline Phosphatase: 58 U/L (ref 39–117)
BUN: 14 mg/dL (ref 6–23)
CHLORIDE: 102 meq/L (ref 96–112)
CO2: 27 meq/L (ref 19–32)
CREATININE: 1.29 mg/dL (ref 0.40–1.50)
Calcium: 10 mg/dL (ref 8.4–10.5)
GFR: 58.75 mL/min — AB (ref >60.00)
Glucose, Bld: 105 mg/dL — ABNORMAL HIGH (ref 70–99)
POTASSIUM: 4.2 meq/L (ref 3.5–5.1)
SODIUM: 137 meq/L (ref 135–145)
TOTAL PROTEIN: 7.5 g/dL (ref 6.0–8.3)
Total Bilirubin: 0.7 mg/dL (ref 0.2–1.2)

## 2017-05-10 LAB — LIPID PANEL
CHOL/HDL RATIO: 7
Cholesterol: 183 mg/dL (ref 0–200)
HDL: 26.5 mg/dL — ABNORMAL LOW (ref >39.00)
NONHDL: 156.35
TRIGLYCERIDES: 236 mg/dL — AB (ref 0.0–149.0)
VLDL: 47.2 mg/dL — ABNORMAL HIGH (ref 0.0–40.0)

## 2017-05-10 LAB — HEMOGLOBIN A1C: HEMOGLOBIN A1C: 6.4 % (ref 4.6–6.5)

## 2017-05-10 NOTE — Progress Notes (Signed)
I reviewed health advisor's note, was available for consultation, and agree with documentation and plan.  

## 2017-05-10 NOTE — Patient Instructions (Signed)
Kevin Campbell , Thank you for taking time to come for your Medicare Wellness Visit. I appreciate your ongoing commitment to your health goals. Please review the following plan we discussed and let me know if I can assist you in the future.   These are the goals we discussed: Goals    None      This is a list of the screening recommended for you and due dates:  Health Maintenance  Topic Date Due  .  Hepatitis C: One time screening is recommended by Center for Disease Control  (CDC) for  adults born from [birth year] through 1963.   1946/09/19  . DTaP/Tdap/Td vaccine (1 - Tdap) 10/18/2006  . Pneumonia vaccines (1 of 2 - PCV13) 08/30/2013  . Flu Shot  05/11/2017  . Tetanus Vaccine  10/11/2018  . Colon Cancer Screening  04/20/2020   Preventive Care for Adults  A healthy lifestyle and preventive care can promote health and wellness. Preventive health guidelines for adults include the following key practices.  . A routine yearly physical is a good way to check with your health care provider about your health and preventive screening. It is a chance to share any concerns and updates on your health and to receive a thorough exam.  . Visit your dentist for a routine exam and preventive care every 6 months. Brush your teeth twice a day and floss once a day. Good oral hygiene prevents tooth decay and gum disease.  . The frequency of eye exams is based on your age, health, family medical history, use  of contact lenses, and other factors. Follow your health care provider's ecommendations for frequency of eye exams.  . Eat a healthy diet. Foods like vegetables, fruits, whole grains, low-fat dairy products, and lean protein foods contain the nutrients you need without too many calories. Decrease your intake of foods high in solid fats, added sugars, and salt. Eat the right amount of calories for you. Get information about a proper diet from your health care provider, if necessary.  . Regular physical  exercise is one of the most important things you can do for your health. Most adults should get at least 150 minutes of moderate-intensity exercise (any activity that increases your heart rate and causes you to sweat) each week. In addition, most adults need muscle-strengthening exercises on 2 or more days a week.  Silver Sneakers may be a benefit available to you. To determine eligibility, you may visit the website: www.silversneakers.com or contact program at (680)863-3924 Mon-Fri between 8AM-8PM.   . Maintain a healthy weight. The body mass index (BMI) is a screening tool to identify possible weight problems. It provides an estimate of body fat based on height and weight. Your health care provider can find your BMI and can help you achieve or maintain a healthy weight.   For adults 20 years and older: ? A BMI below 18.5 is considered underweight. ? A BMI of 18.5 to 24.9 is normal. ? A BMI of 25 to 29.9 is considered overweight. ? A BMI of 30 and above is considered obese.   . Maintain normal blood lipids and cholesterol levels by exercising and minimizing your intake of saturated fat. Eat a balanced diet with plenty of fruit and vegetables. Blood tests for lipids and cholesterol should begin at age 6 and be repeated every 5 years. If your lipid or cholesterol levels are high, you are over 50, or you are at high risk for heart disease, you may need your  cholesterol levels checked more frequently. Ongoing high lipid and cholesterol levels should be treated with medicines if diet and exercise are not working.  . If you smoke, find out from your health care provider how to quit. If you do not use tobacco, please do not start.  . If you choose to drink alcohol, please do not consume more than 2 drinks per day. One drink is considered to be 12 ounces (355 mL) of beer, 5 ounces (148 mL) of wine, or 1.5 ounces (44 mL) of liquor.  . If you are 3-24 years old, ask your health care provider if you  should take aspirin to prevent strokes.  . Use sunscreen. Apply sunscreen liberally and repeatedly throughout the day. You should seek shade when your shadow is shorter than you. Protect yourself by wearing long sleeves, pants, a wide-brimmed hat, and sunglasses year round, whenever you are outdoors.  . Once a month, do a whole body skin exam, using a mirror to look at the skin on your back. Tell your health care provider of new moles, moles that have irregular borders, moles that are larger than a pencil eraser, or moles that have changed in shape or color.

## 2017-05-11 ENCOUNTER — Encounter: Payer: Self-pay | Admitting: Family Medicine

## 2017-05-11 ENCOUNTER — Ambulatory Visit (INDEPENDENT_AMBULATORY_CARE_PROVIDER_SITE_OTHER): Payer: PPO | Admitting: Family Medicine

## 2017-05-11 VITALS — BP 116/68 | HR 65 | Ht 66.0 in | Wt 248.8 lb

## 2017-05-11 DIAGNOSIS — C61 Malignant neoplasm of prostate: Secondary | ICD-10-CM | POA: Diagnosis not present

## 2017-05-11 DIAGNOSIS — N401 Enlarged prostate with lower urinary tract symptoms: Secondary | ICD-10-CM

## 2017-05-11 DIAGNOSIS — N183 Chronic kidney disease, stage 3 unspecified: Secondary | ICD-10-CM

## 2017-05-11 DIAGNOSIS — Z7189 Other specified counseling: Secondary | ICD-10-CM | POA: Diagnosis not present

## 2017-05-11 DIAGNOSIS — K219 Gastro-esophageal reflux disease without esophagitis: Secondary | ICD-10-CM | POA: Diagnosis not present

## 2017-05-11 DIAGNOSIS — R351 Nocturia: Secondary | ICD-10-CM

## 2017-05-11 DIAGNOSIS — E785 Hyperlipidemia, unspecified: Secondary | ICD-10-CM

## 2017-05-11 DIAGNOSIS — Z0000 Encounter for general adult medical examination without abnormal findings: Secondary | ICD-10-CM | POA: Insufficient documentation

## 2017-05-11 DIAGNOSIS — Z636 Dependent relative needing care at home: Secondary | ICD-10-CM | POA: Insufficient documentation

## 2017-05-11 DIAGNOSIS — Z0001 Encounter for general adult medical examination with abnormal findings: Secondary | ICD-10-CM

## 2017-05-11 DIAGNOSIS — Z6379 Other stressful life events affecting family and household: Secondary | ICD-10-CM

## 2017-05-11 DIAGNOSIS — I10 Essential (primary) hypertension: Secondary | ICD-10-CM | POA: Diagnosis not present

## 2017-05-11 DIAGNOSIS — R7303 Prediabetes: Secondary | ICD-10-CM | POA: Diagnosis not present

## 2017-05-11 LAB — LDL CHOLESTEROL, DIRECT: Direct LDL: 126 mg/dL

## 2017-05-11 LAB — HEPATITIS C ANTIBODY: HCV AB: NONREACTIVE

## 2017-05-11 MED ORDER — AMLODIPINE BESYLATE 5 MG PO TABS: 5.0000 mg | ORAL_TABLET | Freq: Every day | ORAL | 3 refills | Status: DC

## 2017-05-11 NOTE — Assessment & Plan Note (Signed)
Advanced directive - packet provided yesterday. HCPOA would be daughter Kevin Campbell

## 2017-05-11 NOTE — Assessment & Plan Note (Signed)
Reviewed with patient. Discussed GERD diet. Discussed OTC protonix or zantac PRN.

## 2017-05-11 NOTE — Assessment & Plan Note (Signed)
Preventative protocols reviewed and updated unless pt declined. Discussed healthy diet and lifestyle.  

## 2017-05-11 NOTE — Assessment & Plan Note (Signed)
Deteriorated off meds. Will need to strongly consider statin at f/u visit.  The 10-year ASCVD risk score Mikey Bussing DC Brooke Bonito., et al., 2013) is: 20%   Values used to calculate the score:     Age: 71 years     Sex: Male     Is Non-Hispanic African American: No     Diabetic: No     Tobacco smoker: No     Systolic Blood Pressure: 841 mmHg     Is BP treated: Yes     HDL Cholesterol: 26.5 mg/dL     Total Cholesterol: 183 mg/dL

## 2017-05-11 NOTE — Assessment & Plan Note (Addendum)
Reviewed with patient, encouraged avoiding added sugars and sweetened beverages in diet.

## 2017-05-11 NOTE — Assessment & Plan Note (Signed)
Chronic, stable. Continue current regimen of amlodipine 5mg daily.  

## 2017-05-11 NOTE — Assessment & Plan Note (Signed)
Sees urology regularly  

## 2017-05-11 NOTE — Assessment & Plan Note (Signed)
Discussed healthy diet and lifestyle changes to affect sustainable weight loss  

## 2017-05-11 NOTE — Assessment & Plan Note (Signed)
Discussed wife's recent dx, support provided.

## 2017-05-11 NOTE — Patient Instructions (Addendum)
For reflux, try to avoid citrus, fatty foods, chocolate, peppermint, and excessive alcohol, along with sodas, orange juice (acidic drinks) At least a few hours between dinner and bed, minimize naps after eating. If ongoing symptoms, may try over the counter zantac (weaker) or prilosec (stronger) Sugar and cholesterol levels were a bit high today - watch added sugars in diet, try to increase fatty fish in diet (salmon, tuna, trout) to help lower triglycerides.  Return as needed or in 6 months for follow up with labs.    Health Maintenance, Male A healthy lifestyle and preventive care is important for your health and wellness. Ask your health care provider about what schedule of regular examinations is right for you. What should I know about weight and diet? Eat a Healthy Diet  Eat plenty of vegetables, fruits, whole grains, low-fat dairy products, and lean protein.  Do not eat a lot of foods high in solid fats, added sugars, or salt.  Maintain a Healthy Weight Regular exercise can help you achieve or maintain a healthy weight. You should:  Do at least 150 minutes of exercise each week. The exercise should increase your heart rate and make you sweat (moderate-intensity exercise).  Do strength-training exercises at least twice a week.  Watch Your Levels of Cholesterol and Blood Lipids  Have your blood tested for lipids and cholesterol every 5 years starting at 69 years of age. If you are at high risk for heart disease, you should start having your blood tested when you are 69 years old. You may need to have your cholesterol levels checked more often if: ? Your lipid or cholesterol levels are high. ? You are older than 69 years of age. ? You are at high risk for heart disease.  What should I know about cancer screening? Many types of cancers can be detected early and may often be prevented. Lung Cancer  You should be screened every year for lung cancer if: ? You are a current smoker  who has smoked for at least 30 years. ? You are a former smoker who has quit within the past 15 years.  Talk to your health care provider about your screening options, when you should start screening, and how often you should be screened.  Colorectal Cancer  Routine colorectal cancer screening usually begins at 69 years of age and should be repeated every 5-10 years until you are 69 years old. You may need to be screened more often if early forms of precancerous polyps or small growths are found. Your health care provider may recommend screening at an earlier age if you have risk factors for colon cancer.  Your health care provider may recommend using home test kits to check for hidden blood in the stool.  A small camera at the end of a tube can be used to examine your colon (sigmoidoscopy or colonoscopy). This checks for the earliest forms of colorectal cancer.  Prostate and Testicular Cancer  Depending on your age and overall health, your health care provider may do certain tests to screen for prostate and testicular cancer.  Talk to your health care provider about any symptoms or concerns you have about testicular or prostate cancer.  Skin Cancer  Check your skin from head to toe regularly.  Tell your health care provider about any new moles or changes in moles, especially if: ? There is a change in a mole's size, shape, or color. ? You have a mole that is larger than a pencil eraser.  Always use sunscreen. Apply sunscreen liberally and repeat throughout the day.  Protect yourself by wearing long sleeves, pants, a wide-brimmed hat, and sunglasses when outside.  What should I know about heart disease, diabetes, and high blood pressure?  If you are 60-15 years of age, have your blood pressure checked every 3-5 years. If you are 76 years of age or older, have your blood pressure checked every year. You should have your blood pressure measured twice-once when you are at a hospital or  clinic, and once when you are not at a hospital or clinic. Record the average of the two measurements. To check your blood pressure when you are not at a hospital or clinic, you can use: ? An automated blood pressure machine at a pharmacy. ? A home blood pressure monitor.  Talk to your health care provider about your target blood pressure.  If you are between 37-55 years old, ask your health care provider if you should take aspirin to prevent heart disease.  Have regular diabetes screenings by checking your fasting blood sugar level. ? If you are at a normal weight and have a low risk for diabetes, have this test once every three years after the age of 67. ? If you are overweight and have a high risk for diabetes, consider being tested at a younger age or more often.  A one-time screening for abdominal aortic aneurysm (AAA) by ultrasound is recommended for men aged 22-75 years who are current or former smokers. What should I know about preventing infection? Hepatitis B If you have a higher risk for hepatitis B, you should be screened for this virus. Talk with your health care provider to find out if you are at risk for hepatitis B infection. Hepatitis C Blood testing is recommended for:  Everyone born from [birth year] through 1965.  Anyone with known risk factors for hepatitis C.  Sexually Transmitted Diseases (STDs)  You should be screened each year for STDs including gonorrhea and chlamydia if: ? You are sexually active and are younger than 69 years of age. ? You are older than 69 years of age and your health care provider tells you that you are at risk for this type of infection. ? Your sexual activity has changed since you were last screened and you are at an increased risk for chlamydia or gonorrhea. Ask your health care provider if you are at risk.  Talk with your health care provider about whether you are at high risk of being infected with HIV. Your health care provider may recommend a  prescription medicine to help prevent HIV infection.  What else can I do?  Schedule regular health, dental, and eye exams.  Stay current with your vaccines (immunizations).  Do not use any tobacco products, such as cigarettes, chewing tobacco, and e-cigarettes. If you need help quitting, ask your health care provider.  Limit alcohol intake to no more than 2 drinks per day. One drink equals 12 ounces of beer, 5 ounces of wine, or 1 ounces of hard liquor.  Do not use street drugs.  Do not share needles.  Ask your health care provider for help if you need support or information about quitting drugs.  Tell your health care provider if you often feel depressed.  Tell your health care provider if you have ever been abused or do not feel safe at home. This information is not intended to replace advice given to you by your health care provider. Make sure you discuss any  questions you have with your health care provider. Document Released: 03/25/2008 Document Revised: 05/26/2016 Document Reviewed: 07/01/2015 Elsevier Interactive Patient Education  [employer].

## 2017-05-11 NOTE — Assessment & Plan Note (Signed)
Mild, stable.  

## 2017-05-11 NOTE — Progress Notes (Signed)
BP 116/68 (BP Location: Left Arm, Patient Position: Sitting, Cuff Size: Normal)   Pulse 65   Ht 5\' 6"  (1.676 m)   Wt 248 lb 12.8 oz (112.9 kg)   SpO2 94%   BMI 40.16 kg/m    CC: CPE Subjective:    Patient ID: Kevin Campbell, male    DOB: 11/17/1945, 71 y.o.   MRN: [id number]  HPI: Kevin Campbell is a 71 y.o. male presenting on 05/11/2017 for Annual Exam (Pt has no complaints today, he does need refills of his amlodpine )   Saw Kevin Campbell yesterday for medicare wellness visit. Note reviewed. Increasing GERD noted.   Some stress over wife's new dementia dx.   RA - has not returned to see rheum. Prior saw Dr Kevin Campbell at [facility].   Has had DOT physical yearly.  Preventative: Colonoscopy 2011 - 1 HP, diverticulosis Kevin Campbell)  H/o prostate cancer 2013 s/p radioactive seed implant (Kevin Campbell).  Flu shot yearly Pneumovax 2013, declines prevnar - would like to get when he gets flu shot.  Td 2008 shingrix - discussed.  Advanced directive - packet provided yesterday. HCPOA would be daughter Kevin Campbell Seat belt use discussed Sunscreen use discussed. No changing moles on skin.  Ex smoker - quit 1992 alcohol - none  Caffeine: 2 cups coffee occasionally, soda throughout the day Lives with wife, no pets.  2 grown children Occupation: truck driver Edu: HS Activity: sedentary, some yardwork  Diet: some water with yardwork, good fruits and vegetables  Relevant past medical, surgical, family and social history reviewed and updated as indicated. Interim medical history since our last visit reviewed. Allergies and medications reviewed and updated. Outpatient Medications Prior to Visit  Medication Sig Dispense Refill  . aspirin EC 81 MG tablet Take 81 mg by mouth daily.    [name] amLODipine (NORVASC) 5 MG tablet Take 1 tablet (5 mg total) by mouth daily. 90 tablet 1   No facility-administered medications prior to visit.      Per HPI unless specifically indicated in ROS  section below Review of Systems  Constitutional: Negative for activity change, appetite change, chills, fatigue, fever and unexpected weight change.  HENT: Negative for hearing loss.   Eyes: Negative for visual disturbance.  Respiratory: Negative for cough, chest tightness, shortness of breath and wheezing.   Cardiovascular: Negative for chest pain, palpitations and leg swelling.  Gastrointestinal: Negative for abdominal distention, abdominal pain, blood in stool, constipation, diarrhea, nausea and vomiting.       GERD treated with milk  Genitourinary: Negative for difficulty urinating and hematuria.  Musculoskeletal: Negative for arthralgias, myalgias and neck pain.  Skin: Negative for rash.  Neurological: Negative for dizziness, seizures, syncope and headaches.  Hematological: Negative for adenopathy. Does not bruise/bleed easily.  Psychiatric/Behavioral: Negative for dysphoric mood. The patient is not nervous/anxious.        Objective:    BP 116/68 (BP Location: Left Arm, Patient Position: Sitting, Cuff Size: Normal)   Pulse 65   Ht 5\' 6"  (1.676 m)   Wt 248 lb 12.8 oz (112.9 kg)   SpO2 94%   BMI 40.16 kg/m   Wt Readings from Last 3 Encounters:  05/11/17 248 lb 12.8 oz (112.9 kg)  05/10/17 248 lb 12.8 oz (112.9 kg)  03/29/17 251 lb 4 oz (114 kg)    Physical Exam  Constitutional: He is oriented to person, place, and time. He appears well-developed and well-nourished. No distress.  HENT:  Head: Normocephalic and atraumatic.  Right  Ear: Hearing, tympanic membrane, external ear and ear canal normal.  Left Ear: Hearing, tympanic membrane, external ear and ear canal normal.  Nose: Nose normal.  Mouth/Throat: Uvula is midline, oropharynx is clear and moist and mucous membranes are normal. No oropharyngeal exudate, posterior oropharyngeal edema or posterior oropharyngeal erythema.  Eyes: Pupils are equal, round, and reactive to light. Conjunctivae and EOM are normal. No scleral  icterus.  Neck: Normal range of motion. Neck supple. Carotid bruit is not present. No thyromegaly present.  Cardiovascular: Normal rate, regular rhythm, normal heart sounds and intact distal pulses.   No murmur heard. Pulses:      Radial pulses are 2+ on the right side, and 2+ on the left side.  Pulmonary/Chest: Effort normal and breath sounds normal. No respiratory distress. He has no wheezes. He has no rales.  Abdominal: Soft. Bowel sounds are normal. He exhibits no distension and no mass. There is no tenderness. There is no rebound and no guarding.  Musculoskeletal: Normal range of motion. He exhibits no edema.  Lymphadenopathy:    He has no cervical adenopathy.  Neurological: He is alert and oriented to person, place, and time.  CN grossly intact, station and gait intact  Skin: Skin is warm and dry. No rash noted.  Psychiatric: He has a normal mood and affect. His behavior is normal. Judgment and thought content normal.  Nursing note and vitals reviewed.  Results for orders placed or performed in visit on 05/10/17  Lipid panel  Result Value Ref Range   Cholesterol 183 0 - 200 mg/dL   Triglycerides 236.0 (H) 0.0 - 149.0 mg/dL   HDL 26.50 (L) >39.00 mg/dL   VLDL 47.2 (H) 0.0 - 40.0 mg/dL   Total CHOL/HDL Ratio 7    NonHDL 156.35   Comprehensive metabolic panel  Result Value Ref Range   Sodium 137 135 - 145 mEq/L   Potassium 4.2 3.5 - 5.1 mEq/L   Chloride 102 96 - 112 mEq/L   CO2 27 19 - 32 mEq/L   Glucose, Bld 105 (H) 70 - 99 mg/dL   BUN 14 6 - 23 mg/dL   Creatinine, Ser 1.29 0.40 - 1.50 mg/dL   Total Bilirubin 0.7 0.2 - 1.2 mg/dL   Alkaline Phosphatase 58 39 - 117 U/L   AST 26 0 - 37 U/L   ALT 27 0 - 53 U/L   Total Protein 7.5 6.0 - 8.3 g/dL   Albumin 4.1 3.5 - 5.2 g/dL   Calcium 10.0 8.4 - 10.5 mg/dL   GFR 58.75 (L) >60.00 mL/min  Hemoglobin A1c  Result Value Ref Range   Hgb A1c MFr Bld 6.4 4.6 - 6.5 %  Hepatitis C antibody  Result Value Ref Range   HCV Ab  NON-REACTIVE NON-REACTIVE  LDL cholesterol, direct  Result Value Ref Range   Direct LDL 126.0 mg/dL      Assessment & Plan:   Problem List Items Addressed This Visit    Advanced care planning/counseling discussion    Advanced directive - packet provided yesterday. HCPOA would be daughter Kevin Campbell      BPH associated with nocturia    Sees urology regularly.      CKD (chronic kidney disease) stage 3, GFR 30-59 ml/min    Mild, stable.       Dyslipidemia    Deteriorated off meds. Will need to strongly consider statin at f/u visit.  The 10-year ASCVD risk score Mikey Bussing DC Jr., et al., 2013) is: 20%  Values used to calculate the score:     Age: 48 years     Sex: Male     Is Non-Hispanic African American: No     Diabetic: No     Tobacco smoker: No     Systolic Blood Pressure: 295 mmHg     Is BP treated: Yes     HDL Cholesterol: 26.5 mg/dL     Total Cholesterol: 183 mg/dL       Essential hypertension    Chronic, stable. Continue current regimen of amlodipine 5mg  daily.       Relevant Medications   amLODipine (NORVASC) 5 MG tablet   GERD    Reviewed with patient. Discussed GERD diet. Discussed OTC protonix or zantac PRN.       Healthcare maintenance - Primary    Preventative protocols reviewed and updated unless pt declined. Discussed healthy diet and lifestyle.       Obesity, Class III, BMI 40-49.9 (morbid obesity) ([facility])    Discussed healthy diet and lifestyle changes to affect sustainable weight loss.       Prediabetes    Reviewed with patient, encouraged avoiding added sugars and sweetened beverages in diet.       Primary prostate adenocarcinoma [facility])    Followed by urology appreciate their care. We did not check PSA today.       Stress due to illness of family member    Discussed wife's recent dx, support provided.          Follow up plan: Return in about 6 months (around 11/11/2017) for annual exam, prior fasting for blood work, medicare  wellness visit.  Ria Bush, MD

## 2017-05-11 NOTE — Assessment & Plan Note (Addendum)
Followed by urology appreciate their care. We did not check PSA today.

## 2017-05-12 NOTE — Progress Notes (Signed)
I reviewed health advisor's note, was available for consultation, and agree with documentation and plan.  

## 2017-07-25 DIAGNOSIS — M25511 Pain in right shoulder: Secondary | ICD-10-CM | POA: Diagnosis not present

## 2017-11-06 ENCOUNTER — Other Ambulatory Visit: Payer: Self-pay | Admitting: Family Medicine

## 2017-11-06 DIAGNOSIS — C61 Malignant neoplasm of prostate: Secondary | ICD-10-CM

## 2017-11-06 DIAGNOSIS — N183 Chronic kidney disease, stage 3 unspecified: Secondary | ICD-10-CM

## 2017-11-06 DIAGNOSIS — M06041 Rheumatoid arthritis without rheumatoid factor, right hand: Secondary | ICD-10-CM

## 2017-11-06 DIAGNOSIS — M06042 Rheumatoid arthritis without rheumatoid factor, left hand: Secondary | ICD-10-CM

## 2017-11-06 DIAGNOSIS — R7303 Prediabetes: Secondary | ICD-10-CM

## 2017-11-06 DIAGNOSIS — E785 Hyperlipidemia, unspecified: Secondary | ICD-10-CM

## 2017-11-07 ENCOUNTER — Other Ambulatory Visit: Payer: Self-pay

## 2017-11-08 DIAGNOSIS — H2513 Age-related nuclear cataract, bilateral: Secondary | ICD-10-CM | POA: Diagnosis not present

## 2017-11-08 DIAGNOSIS — H52223 Regular astigmatism, bilateral: Secondary | ICD-10-CM | POA: Diagnosis not present

## 2017-11-08 DIAGNOSIS — H25043 Posterior subcapsular polar age-related cataract, bilateral: Secondary | ICD-10-CM | POA: Diagnosis not present

## 2017-11-08 DIAGNOSIS — H524 Presbyopia: Secondary | ICD-10-CM | POA: Diagnosis not present

## 2017-11-14 ENCOUNTER — Ambulatory Visit: Payer: Self-pay | Admitting: Family Medicine

## 2017-11-22 DIAGNOSIS — M25561 Pain in right knee: Secondary | ICD-10-CM | POA: Diagnosis not present

## 2017-11-22 DIAGNOSIS — S83241A Other tear of medial meniscus, current injury, right knee, initial encounter: Secondary | ICD-10-CM | POA: Diagnosis not present

## 2017-11-29 DIAGNOSIS — M25561 Pain in right knee: Secondary | ICD-10-CM | POA: Diagnosis not present

## 2017-11-29 DIAGNOSIS — S83241A Other tear of medial meniscus, current injury, right knee, initial encounter: Secondary | ICD-10-CM | POA: Diagnosis not present

## 2017-12-09 HISTORY — PX: KNEE ARTHROSCOPY: SUR90

## 2017-12-14 DIAGNOSIS — M94261 Chondromalacia, right knee: Secondary | ICD-10-CM | POA: Diagnosis not present

## 2017-12-14 DIAGNOSIS — M6751 Plica syndrome, right knee: Secondary | ICD-10-CM | POA: Diagnosis not present

## 2017-12-14 DIAGNOSIS — M23241 Derangement of anterior horn of lateral meniscus due to old tear or injury, right knee: Secondary | ICD-10-CM | POA: Diagnosis not present

## 2017-12-14 DIAGNOSIS — M23221 Derangement of posterior horn of medial meniscus due to old tear or injury, right knee: Secondary | ICD-10-CM | POA: Diagnosis not present

## 2017-12-14 DIAGNOSIS — G8918 Other acute postprocedural pain: Secondary | ICD-10-CM | POA: Diagnosis not present

## 2017-12-19 DIAGNOSIS — M25661 Stiffness of right knee, not elsewhere classified: Secondary | ICD-10-CM | POA: Diagnosis not present

## 2017-12-19 DIAGNOSIS — R262 Difficulty in walking, not elsewhere classified: Secondary | ICD-10-CM | POA: Diagnosis not present

## 2017-12-19 DIAGNOSIS — M25561 Pain in right knee: Secondary | ICD-10-CM | POA: Diagnosis not present

## 2017-12-20 DIAGNOSIS — Z9889 Other specified postprocedural states: Secondary | ICD-10-CM | POA: Diagnosis not present

## 2017-12-21 DIAGNOSIS — R262 Difficulty in walking, not elsewhere classified: Secondary | ICD-10-CM | POA: Diagnosis not present

## 2017-12-21 DIAGNOSIS — M25561 Pain in right knee: Secondary | ICD-10-CM | POA: Diagnosis not present

## 2017-12-21 DIAGNOSIS — M25661 Stiffness of right knee, not elsewhere classified: Secondary | ICD-10-CM | POA: Diagnosis not present

## 2017-12-27 DIAGNOSIS — R262 Difficulty in walking, not elsewhere classified: Secondary | ICD-10-CM | POA: Diagnosis not present

## 2017-12-27 DIAGNOSIS — M25561 Pain in right knee: Secondary | ICD-10-CM | POA: Diagnosis not present

## 2017-12-27 DIAGNOSIS — M25661 Stiffness of right knee, not elsewhere classified: Secondary | ICD-10-CM | POA: Diagnosis not present

## 2017-12-30 DIAGNOSIS — M25561 Pain in right knee: Secondary | ICD-10-CM | POA: Diagnosis not present

## 2017-12-30 DIAGNOSIS — R262 Difficulty in walking, not elsewhere classified: Secondary | ICD-10-CM | POA: Diagnosis not present

## 2017-12-30 DIAGNOSIS — M25661 Stiffness of right knee, not elsewhere classified: Secondary | ICD-10-CM | POA: Diagnosis not present

## 2018-01-03 DIAGNOSIS — M25661 Stiffness of right knee, not elsewhere classified: Secondary | ICD-10-CM | POA: Diagnosis not present

## 2018-01-03 DIAGNOSIS — R262 Difficulty in walking, not elsewhere classified: Secondary | ICD-10-CM | POA: Diagnosis not present

## 2018-01-03 DIAGNOSIS — M25461 Effusion, right knee: Secondary | ICD-10-CM | POA: Diagnosis not present

## 2018-01-03 DIAGNOSIS — M25561 Pain in right knee: Secondary | ICD-10-CM | POA: Diagnosis not present

## 2018-01-06 DIAGNOSIS — R262 Difficulty in walking, not elsewhere classified: Secondary | ICD-10-CM | POA: Diagnosis not present

## 2018-01-06 DIAGNOSIS — M25661 Stiffness of right knee, not elsewhere classified: Secondary | ICD-10-CM | POA: Diagnosis not present

## 2018-01-06 DIAGNOSIS — M25561 Pain in right knee: Secondary | ICD-10-CM | POA: Diagnosis not present

## 2018-01-24 DIAGNOSIS — M25561 Pain in right knee: Secondary | ICD-10-CM | POA: Diagnosis not present

## 2018-02-21 DIAGNOSIS — M25561 Pain in right knee: Secondary | ICD-10-CM | POA: Diagnosis not present

## 2018-03-21 ENCOUNTER — Ambulatory Visit: Payer: Self-pay | Admitting: Family Medicine

## 2018-03-21 DIAGNOSIS — M25561 Pain in right knee: Secondary | ICD-10-CM | POA: Diagnosis not present

## 2018-03-21 DIAGNOSIS — M25461 Effusion, right knee: Secondary | ICD-10-CM | POA: Diagnosis not present

## 2018-03-27 ENCOUNTER — Encounter: Payer: Self-pay | Admitting: Family Medicine

## 2018-03-27 ENCOUNTER — Ambulatory Visit (INDEPENDENT_AMBULATORY_CARE_PROVIDER_SITE_OTHER): Payer: PPO | Admitting: Family Medicine

## 2018-03-27 VITALS — BP 120/68 | HR 72 | Temp 98.2°F | Ht 66.0 in | Wt 248.8 lb

## 2018-03-27 DIAGNOSIS — R351 Nocturia: Secondary | ICD-10-CM

## 2018-03-27 DIAGNOSIS — R5383 Other fatigue: Secondary | ICD-10-CM | POA: Diagnosis not present

## 2018-03-27 DIAGNOSIS — N401 Enlarged prostate with lower urinary tract symptoms: Secondary | ICD-10-CM

## 2018-03-27 DIAGNOSIS — L237 Allergic contact dermatitis due to plants, except food: Secondary | ICD-10-CM | POA: Diagnosis not present

## 2018-03-27 DIAGNOSIS — N3281 Overactive bladder: Secondary | ICD-10-CM | POA: Diagnosis not present

## 2018-03-27 DIAGNOSIS — R21 Rash and other nonspecific skin eruption: Secondary | ICD-10-CM | POA: Insufficient documentation

## 2018-03-27 MED ORDER — TAMSULOSIN HCL 0.4 MG PO CAPS: 0.4000 mg | ORAL_CAPSULE | Freq: Every day | ORAL | 3 refills | Status: DC

## 2018-03-27 MED ORDER — CLOBETASOL PROPIONATE 0.05 % EX CREA: 1.0000 "application " | TOPICAL_CREAM | Freq: Two times a day (BID) | CUTANEOUS | 0 refills | Status: DC

## 2018-03-27 NOTE — Assessment & Plan Note (Addendum)
Prolonged reaction. Will rx stronger potency topical steroid. Update if not improved with this. Discussed steroid precautions. He has tried and failed multiple OTC remedies.

## 2018-03-27 NOTE — Patient Instructions (Signed)
Treat poison oak with clobetasol steroid cream twice daily to affected areas for max 2 weeks at a time. Sounds like fatigue may be coming from poor sleep -  Trial flomax again - but take 1 tablet in the morning. Let me know how this helps, consider returning to Dr Matilde Sprang for further evaluation of night time awakenings.

## 2018-03-27 NOTE — Assessment & Plan Note (Addendum)
Chronic issue, has tried myrbetriq, rapaflo, jalyn, PTNS with mild benefit, has not returned to urology (MacDiarmid). Anticipate BPH with OAB that aleads to nocturia which leads to non-restorative sleep and consequently fatigue. Prior flomax caused trouble sleeping - will try this again, but in the mornings. Update with effect.

## 2018-03-27 NOTE — Assessment & Plan Note (Addendum)
Consider updated UA. Suggested return to urology if not better with treatment.

## 2018-03-27 NOTE — Assessment & Plan Note (Addendum)
Ongoing for 2 months - anticipate due to poor sleep from nocturia from OAB/BPH. See above. No red flags.

## 2018-03-27 NOTE — Progress Notes (Addendum)
BP 120/68 (BP Location: Left Arm, Patient Position: Sitting, Cuff Size: Large)   Pulse 72   Temp 98.2 F (36.8 C) (Oral)   Ht 5\' 6"  (1.676 m)   Wt 248 lb 12 oz (112.8 kg)   SpO2 94%   BMI 40.15 kg/m    CC: rash, fatigue Subjective:    Patient ID: Kevin Campbell, male    DOB: 02/16/1946, 72 y.o.   MRN: [id number]  HPI: Kevin Campbell is a 72 y.o. male presenting on 03/27/2018 for Rash (Located on lower LEs for about 5 wks. Rash is very itchy. Tried peroxied and cortisone cream at suggestion of pharmacist.  Barely helpful. ) and Fatigue (Has felt tired and low energy for last 2 mos.)   5 wk h/o lower extremity rash that is very itchy. Tried calomine, peroxide and cortisone 10 OTC with some improvement, but persistent itching. Identified poison oak.  Denies new lotions, detergents, soaps or shampoos.   Increasing fatigue noted over last 2 months. Easy fatiguability. Notes increased nocturia - 10 times a night. flomax didn't let him rest. myrbetriq was beneficial but unaffordable. He also had PTNS. Known prostate cancer s/p seed implants, latest PSA 0.  No weight loss noted, no night sweats, body aches.   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   Relevant past medical, surgical, family and social history reviewed and updated as indicated. Interim medical history since our last visit reviewed. Allergies and medications reviewed and updated. Outpatient Medications Prior to Visit  Medication Sig Dispense Refill  . amLODipine (NORVASC) 5 MG tablet Take 1 tablet (5 mg total) by mouth daily. 90 tablet 3  . aspirin EC 81 MG tablet Take 81 mg by mouth daily.     No facility-administered medications prior to visit.      Per HPI unless specifically indicated in ROS section below Review of Systems     Objective:    BP 120/68 (BP Location: Left Arm, Patient Position: Sitting, Cuff Size: Large)   Pulse 72   Temp 98.2 F (36.8 C) (Oral)   Ht 5\' 6"  (1.676 m)   Wt 248 lb 12  oz (112.8 kg)   SpO2 94%   BMI 40.15 kg/m   Wt Readings from Last 3 Encounters:  03/27/18 248 lb 12 oz (112.8 kg)  05/11/17 248 lb 12.8 oz (112.9 kg)  05/10/17 248 lb 12.8 oz (112.9 kg)    Physical Exam  Constitutional: He appears well-developed and well-nourished. No distress.  Musculoskeletal: He exhibits no edema.  Skin: Skin is warm and dry. Rash noted.  Faint erythematous papular rash bilateral lower legs as well as patches right knee medial and laterally - very pruritic  Nursing note and vitals reviewed.  Results for orders placed or performed in visit on 05/10/17  Lipid panel  Result Value Ref Range   Cholesterol 183 0 - 200 mg/dL   Triglycerides 236.0 (H) 0.0 - 149.0 mg/dL   HDL 26.50 (L) >39.00 mg/dL   VLDL 47.2 (H) 0.0 - 40.0 mg/dL   Total CHOL/HDL Ratio 7    NonHDL 156.35   Comprehensive metabolic panel  Result Value Ref Range   Sodium 137 135 - 145 mEq/L   Potassium 4.2 3.5 - 5.1 mEq/L   Chloride 102 96 - 112 mEq/L   CO2 27 19 - 32 mEq/L   Glucose, Bld 105 (H) 70 - 99 mg/dL   BUN 14 6 - 23 mg/dL   Creatinine, Ser 1.29 0.40 -  1.50 mg/dL   Total Bilirubin 0.7 0.2 - 1.2 mg/dL   Alkaline Phosphatase 58 39 - 117 U/L   AST 26 0 - 37 U/L   ALT 27 0 - 53 U/L   Total Protein 7.5 6.0 - 8.3 g/dL   Albumin 4.1 3.5 - 5.2 g/dL   Calcium 10.0 8.4 - 10.5 mg/dL   GFR 58.75 (L) >60.00 mL/min  Hemoglobin A1c  Result Value Ref Range   Hgb A1c MFr Bld 6.4 4.6 - 6.5 %  Hepatitis C antibody  Result Value Ref Range   HCV Ab NON-REACTIVE NON-REACTIVE  LDL cholesterol, direct  Result Value Ref Range   Direct LDL 126.0 mg/dL      Assessment & Plan:   Problem List Items Addressed This Visit    Poison ivy dermatitis - Primary    Prolonged reaction. Will rx stronger potency topical steroid. Update if not improved with this. Discussed steroid precautions. He has tried and failed multiple OTC remedies.       OAB (overactive bladder)    Chronic issue, has tried myrbetriq,  rapaflo, jalyn, PTNS with mild benefit, has not returned to urology ([facility]). Anticipate BPH with OAB that aleads to nocturia which leads to non-restorative sleep and consequently fatigue. Prior flomax caused trouble sleeping - will try this again, but in the mornings. Update with effect.      Fatigue    Ongoing for 2 months - anticipate due to poor sleep from nocturia from OAB/BPH. See above. No red flags.       BPH associated with nocturia    Consider updated UA. Suggested return to urology if not better with treatment.       Relevant Medications   tamsulosin (FLOMAX) 0.4 MG CAPS capsule      ADDENDUM ==> received notice from pharmacy that clobetasol was too expensive - will Rx TCI cream.  Meds ordered this encounter  Medications  . clobetasol cream (TEMOVATE) 0.05 %    Sig: Apply 1 application topically 2 (two) times daily. Apply to AA, no more than 2 weeks at a time    Dispense:  45 g    Refill:  0  . tamsulosin (FLOMAX) 0.4 MG CAPS capsule    Sig: Take 1 capsule (0.4 mg total) by mouth daily. Take in the mornings    Dispense:  30 capsule    Refill:  3   No orders of the defined types were placed in this encounter.   Follow up plan: Return if symptoms worsen or fail to improve.  Ria Bush, MD

## 2018-03-28 MED ORDER — TRIAMCINOLONE ACETONIDE 0.1 % EX CREA: 1.0000 "application " | TOPICAL_CREAM | Freq: Two times a day (BID) | CUTANEOUS | 0 refills | Status: AC

## 2018-03-28 NOTE — Addendum Note (Signed)
Addended by: Ria Bush on: 03/28/2018 05:48 PM   Modules accepted: Orders

## 2018-04-14 ENCOUNTER — Ambulatory Visit (INDEPENDENT_AMBULATORY_CARE_PROVIDER_SITE_OTHER): Payer: PPO | Admitting: Family Medicine

## 2018-04-14 ENCOUNTER — Encounter: Payer: Self-pay | Admitting: Family Medicine

## 2018-04-14 VITALS — BP 126/78 | HR 68 | Temp 98.2°F | Wt 248.8 lb

## 2018-04-14 DIAGNOSIS — G4730 Sleep apnea, unspecified: Secondary | ICD-10-CM

## 2018-04-14 DIAGNOSIS — R351 Nocturia: Secondary | ICD-10-CM | POA: Diagnosis not present

## 2018-04-14 DIAGNOSIS — N401 Enlarged prostate with lower urinary tract symptoms: Secondary | ICD-10-CM

## 2018-04-14 DIAGNOSIS — G4733 Obstructive sleep apnea (adult) (pediatric): Secondary | ICD-10-CM | POA: Insufficient documentation

## 2018-04-14 NOTE — Patient Instructions (Signed)
See our referral coordinators for appointment.

## 2018-04-14 NOTE — Assessment & Plan Note (Signed)
This is improving with flomax.

## 2018-04-14 NOTE — Assessment & Plan Note (Addendum)
Gives good story for OSA - some daytime somnolence, witnessed snoring and apneic episodes at night - despite low ESS score of 5.  Will refer to pulm. 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

## 2018-04-14 NOTE — Progress Notes (Signed)
BP 126/78 (BP Location: Left Arm, Patient Position: Sitting, Cuff Size: Large)   Pulse 68   Temp 98.2 F (36.8 C) (Oral)   Wt 248 lb 12 oz (112.8 kg)   SpO2 96%   BMI 40.15 kg/m    CC: sleep apnea referral Subjective:    Patient ID: NIKIA MANGINO, male    DOB: June 26, 1946, 72 y.o.   MRN: [id number]  HPI: KELYN KOSKELA is a 72 y.o. male presenting on 04/14/2018 for Referral (Pt request referral for sleep apnea.  Had DOT PE in 02/2018 and was told if neck was >7 in circumference, he will need sleep study. 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  Requests referral in [place].  Sometimes wakes up feeling rested, sometimes still feels tired.  He does snore, wife says he can stop breathing at night. No PNdyspnea. Occasional daytime somnolence.   Poison ivy dermatitis has resolved.  Flomax has helped nocturia.   Wife could not tolerate CPAP.   Relevant past medical, surgical, family and social history reviewed and updated as indicated. Interim medical history since our last visit reviewed. Allergies and medications reviewed and updated. Outpatient Medications Prior to Visit  Medication Sig Dispense Refill  . amLODipine (NORVASC) 5 MG tablet Take 1 tablet (5 mg total) by mouth daily. 90 tablet 3  . aspirin EC 81 MG tablet Take 81 mg by mouth daily.    . tamsulosin (FLOMAX) 0.4 MG CAPS capsule Take 1 capsule (0.4 mg total) by mouth daily. Take in the mornings 30 capsule 3  . triamcinolone cream (KENALOG) 0.1 % Apply 1 application topically 2 (two) times daily. Apply to AA. 80 g 0   No facility-administered medications prior to visit.      Per HPI unless specifically indicated in ROS section below Review of Systems     Objective:    BP 126/78 (BP Location: Left Arm, Patient Position: Sitting, Cuff Size: Large)   Pulse 68   Temp 98.2 F (36.8 C) (Oral)   Wt 248 lb 12 oz (112.8 kg)   SpO2 96%   BMI 40.15 kg/m   Wt Readings  from Last 3 Encounters:  04/14/18 248 lb 12 oz (112.8 kg)  03/27/18 248 lb 12 oz (112.8 kg)  05/11/17 248 lb 12.8 oz (112.9 kg)    Physical Exam  Constitutional: He appears well-developed and well-nourished. No distress.  Nursing note and vitals reviewed.  Results for orders placed or performed in visit on 05/10/17  Lipid panel  Result Value Ref Range   Cholesterol 183 0 - 200 mg/dL   Triglycerides 236.0 (H) 0.0 - 149.0 mg/dL   HDL 26.50 (L) >39.00 mg/dL   VLDL 47.2 (H) 0.0 - 40.0 mg/dL   Total CHOL/HDL Ratio 7    NonHDL 156.35   Comprehensive metabolic panel  Result Value Ref Range   Sodium 137 135 - 145 mEq/L   Potassium 4.2 3.5 - 5.1 mEq/L   Chloride 102 96 - 112 mEq/L   CO2 27 19 - 32 mEq/L   Glucose, Bld 105 (H) 70 - 99 mg/dL   BUN 14 6 - 23 mg/dL   Creatinine, Ser 1.29 0.40 - 1.50 mg/dL   Total Bilirubin 0.7 0.2 - 1.2 mg/dL   Alkaline Phosphatase 58 39 - 117 U/L   AST 26 0 - 37 U/L   ALT 27 0 - 53 U/L   Total Protein 7.5 6.0 - 8.3 g/dL  Albumin 4.1 3.5 - 5.2 g/dL   Calcium 10.0 8.4 - 10.5 mg/dL   GFR 58.75 (L) >60.00 mL/min  Hemoglobin A1c  Result Value Ref Range   Hgb A1c MFr Bld 6.4 4.6 - 6.5 %  Hepatitis C antibody  Result Value Ref Range   HCV Ab NON-REACTIVE NON-REACTIVE  LDL cholesterol, direct  Result Value Ref Range   Direct LDL 126.0 mg/dL      Assessment & Plan:   Problem List Items Addressed This Visit    Sleep apnea - Primary    Gives good story for OSA - some daytime somnolence, witnessed snoring and apneic episodes at night - despite low ESS score of 5.  Will refer to pulm. 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       Relevant Orders   Ambulatory referral to Pulmonology   BPH associated with nocturia    This is improving with flomax.           No orders of the defined types were placed in this encounter.  Orders Placed This Encounter  Procedures  . Ambulatory referral to Pulmonology    Referral Priority:   Urgent    Referral Type:    Consultation    Referral Reason:   Specialty Services Required    Requested Specialty:   Pulmonary Disease    Number of Visits Requested:   1    Follow up plan: No follow-ups on file.  Ria Bush, MD

## 2018-04-18 ENCOUNTER — Ambulatory Visit: Payer: PPO | Admitting: Pulmonary Disease

## 2018-04-18 ENCOUNTER — Encounter: Payer: Self-pay | Admitting: Pulmonary Disease

## 2018-04-18 VITALS — BP 128/80 | HR 74 | Ht 66.0 in | Wt 246.0 lb

## 2018-04-18 DIAGNOSIS — E668 Other obesity: Secondary | ICD-10-CM | POA: Diagnosis not present

## 2018-04-18 DIAGNOSIS — G4719 Other hypersomnia: Secondary | ICD-10-CM | POA: Diagnosis not present

## 2018-04-18 DIAGNOSIS — R0683 Snoring: Secondary | ICD-10-CM

## 2018-04-18 DIAGNOSIS — G4710 Hypersomnia, unspecified: Secondary | ICD-10-CM

## 2018-04-18 NOTE — Addendum Note (Signed)
Addended by: Oscar La R on: 04/18/2018 11:21 AM   Modules accepted: Orders

## 2018-04-18 NOTE — Patient Instructions (Signed)
We will arrange for home sleep study Depending on results of that study, we will determine whether treatment for sleep apnea is necessary  Follow-up will be arranged

## 2018-04-18 NOTE — Progress Notes (Signed)
PULMONARY/SLEEP CONSULT NOTE  Requesting MD/Service: Danise Mina Date of initial consultation: 04/18/18 Reason for consultation: Suspected sleep apnea  PT PROFILE: 72 y.o. male former smoker (remote) for evaluation of suspected OSA  DATA:  INTERVAL:  HPI:  He underwent routine physical exam for DOT and on the basis of neck circumference was told that he requires a PSG to R/O OSA. He occasionally awakens gasping for air. He is unsure whether he snores. His wife has dementia and sleeps fitfully. Therefore, he describes his sleep as sometimes restful and sometimes not. Epworth Sleepiness Scale score is 5. He is treated for hypertension  Past Medical History:  Diagnosis Date  . BPH (benign prostatic hypertrophy)   . Carpal tunnel syndrome on both sides   . Colon polyps 2011   [name]  . GERD (gastroesophageal reflux disease)    watches diet  . Nocturia   . Overactive bladder    due to prostate seed implant radiation, failed myrbetriq, rapaflo, jalyn, now PTNS ([facility])  . Prostate cancer ([place]) 08/24/12   Adenocarcinoma,gleason=3+4=7,& 3+3=6,PSA=5.75,volume=23cc(5/12)cores positive; treated with XRT and radioactive seed implant, doing well ([place])  . Rheumatoid arthritis ([place])    of hands by Xray but RF, CCP, CRP negative (12/2013)  . Seasonal allergies     Past Surgical History:  Procedure Laterality Date  . CARDIOVASCULAR STRESS TEST  11-26-2010   Normal lexiscan nuclear study with inferior thinning but no ischemia  . CLAVICLE SURGERY Left 1968   REMOVAL OF BONE SPUR FROM PREVIOUS FX YEARS AGO  . COLONOSCOPY  2011   1 polyp sigmoid (hyperplastic), diverticulosis, int hemorrhoids [facility] K[facility] at [facility])  . KNEE ARTHROSCOPY Left JAN 2013  . PROSTATE BIOPSY  08/24/12   Adenocarcinoma  . RADIOACTIVE SEED IMPLANT N/A 02/16/2013   Procedure: RADIOACTIVE PROSTATE SEED IMPLANT;  Surgeon: Claybon Jabs, MD  . SHOULDER ARTHROSCOPY WITH BICEPS TENDON REPAIR Right APRIL 2013  . TOTAL KNEE  ARTHROPLASTY  09/26/2012   Procedure: TOTAL KNEE ARTHROPLASTY;  Surgeon: Sydnee Cabal, MD;  Location: [facility];  Service: Orthopedics;  Laterality: Left;    MEDICATIONS: I have reviewed all medications and confirmed regimen as documented  Social History   Socioeconomic History  . Marital status: Married    Spouse name: Not on file  . Number of children: Not on file  . Years of education: Not on file  . Highest education level: Not on file  Occupational History  . Not on file  Social Needs  . Financial resource strain: Not on file  . Food insecurity:    Worry: Not on file    Inability: Not on file  . Transportation needs:    Medical: Not on file    Non-medical: Not on file  Tobacco Use  . Smoking status: Former Smoker    Years: 5.00    Types: Cigars    Last attempt to quit: 10/11/1990    Years since quitting: 27.5  . Smokeless tobacco: Never Used  . Tobacco comment: usually  chewed on cigars  Substance and Sexual Activity  . Alcohol use: No  . Drug use: No  . Sexual activity: Not on file  Lifestyle  . Physical activity:    Days per week: Not on file    Minutes per session: Not on file  . Stress: Not on file  Relationships  . Social connections:    Talks on phone: Not on file    Gets together: Not on file    Attends religious service: Not on file  Active member of club or organization: Not on file    Attends meetings of clubs or organizations: Not on file    Relationship status: Not on file  . Intimate partner violence:    Fear of current or ex partner: Not on file    Emotionally abused: Not on file    Physically abused: Not on file    Forced sexual activity: Not on file  Other Topics Concern  . Not on file  Social History Narrative   Caffeine: 2 cups coffee occasionally, soda throughout the day   Lives with wife, no pets.  2 grown children   Occupation: truck driver   Edu: HS   Activity: sedentary   Diet: rare water, good fruits and vegetables    Family  History  Problem Relation Age of Onset  . Coronary artery disease Mother 38       3vCABG  . Arthritis Mother   . Rheumatologic disease Mother   . Cancer Father        lung, smoker  . Cancer Sister        twin sister - breast  . Stroke Neg Hx   . Diabetes Neg Hx     ROS: No fever, myalgias/arthralgias, unexplained weight loss or weight gain No new focal weakness or sensory deficits No otalgia, hearing loss, visual changes, nasal and sinus symptoms, mouth and throat problems No neck pain or adenopathy No abdominal pain, N/V/D, diarrhea, change in bowel pattern No dysuria, change in urinary pattern   Vitals:   04/18/18 1041 04/18/18 1050  BP:  128/80  Pulse:  74  SpO2:  97%  Weight: 246 lb (111.6 kg)   Height: 5\' 6"  (1.676 m)      EXAM:  Gen: moderately obese, No overt respiratory distress HEENT: NCAT, sclera white, oropharynx normal Neck: Supple without LAN, thyromegaly, JVD Lungs: breath sounds full, percussion normal, adventitious sounds: none Cardiovascular: RRR, no murmurs noted Abdomen: Centripetal obesity, soft, nontender, normal BS Ext: minimal symmetric pretibial edema without clubbing, cyanosis Neuro: CNs grossly intact, motor and sensory intact Skin: Limited exam, no lesions noted  DATA:   BMP Latest Ref Rng & Units 05/10/2017 02/07/2017 02/25/2014  Glucose 70 - 99 mg/dL 105(H) 129(H) 84  BUN 6 - 23 mg/dL 14 15 16   Creatinine 0.40 - 1.50 mg/dL 1.29 1.27 1.3  Sodium 135 - 145 mEq/L 137 138 141  Potassium 3.5 - 5.1 mEq/L 4.2 4.2 4.3  Chloride 96 - 112 mEq/L 102 105 107  CO2 19 - 32 mEq/L 27 27 27   Calcium 8.4 - 10.5 mg/dL 10.0 9.5 9.2    CBC Latest Ref Rng & Units 02/09/2013 09/29/2012 09/28/2012  WBC 4.0 - 10.5 K/uL 6.7 8.5 8.6  Hemoglobin 13.0 - 17.0 g/dL 13.6 10.8(L) 11.6(L)  Hematocrit 39.0 - 52.0 % 39.4 32.3(L) 34.6(L)  Platelets 150 - 400 K/uL 200 146(L) 161    CXR:  No recent film  I have personally reviewed all chest radiographs reported  above including CXRs and CT chest unless otherwise indicated  IMPRESSION:     ICD-10-CM   1. Snoring R06.83   2. Daytime hypersomnia G47.19   3. Moderate obesity E66.8    Possible OSA  PLAN:  Home PSG ordered We will contact him after results are available to discuss whether treatment is warranted   Merton Border, MD PCCM service Mobile [phone] Pager [phone] 04/18/2018 10:51 AM

## 2018-04-27 ENCOUNTER — Encounter: Payer: Self-pay | Admitting: Internal Medicine

## 2018-04-27 DIAGNOSIS — G4733 Obstructive sleep apnea (adult) (pediatric): Secondary | ICD-10-CM | POA: Diagnosis not present

## 2018-04-27 DIAGNOSIS — G4719 Other hypersomnia: Secondary | ICD-10-CM

## 2018-04-27 DIAGNOSIS — G4710 Hypersomnia, unspecified: Secondary | ICD-10-CM

## 2018-05-03 DIAGNOSIS — R21 Rash and other nonspecific skin eruption: Secondary | ICD-10-CM | POA: Diagnosis not present

## 2018-05-03 DIAGNOSIS — L821 Other seborrheic keratosis: Secondary | ICD-10-CM | POA: Diagnosis not present

## 2018-05-05 ENCOUNTER — Encounter: Payer: Self-pay | Admitting: *Deleted

## 2018-05-05 ENCOUNTER — Telehealth: Payer: Self-pay | Admitting: *Deleted

## 2018-05-05 DIAGNOSIS — G4733 Obstructive sleep apnea (adult) (pediatric): Secondary | ICD-10-CM

## 2018-05-05 NOTE — Telephone Encounter (Signed)
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 Nothing further needed.

## 2018-05-17 DIAGNOSIS — E668 Other obesity: Secondary | ICD-10-CM | POA: Diagnosis not present

## 2018-05-17 DIAGNOSIS — G4733 Obstructive sleep apnea (adult) (pediatric): Secondary | ICD-10-CM | POA: Diagnosis not present

## 2018-05-17 DIAGNOSIS — R0683 Snoring: Secondary | ICD-10-CM | POA: Diagnosis not present

## 2018-05-17 DIAGNOSIS — G4719 Other hypersomnia: Secondary | ICD-10-CM | POA: Diagnosis not present

## 2018-05-21 ENCOUNTER — Encounter: Payer: Self-pay | Admitting: Family Medicine

## 2018-05-22 ENCOUNTER — Ambulatory Visit (INDEPENDENT_AMBULATORY_CARE_PROVIDER_SITE_OTHER): Payer: PPO

## 2018-05-22 VITALS — BP 120/80 | HR 71 | Temp 97.9°F | Ht 67.5 in | Wt 244.5 lb

## 2018-05-22 DIAGNOSIS — Z0000 Encounter for general adult medical examination without abnormal findings: Secondary | ICD-10-CM

## 2018-05-22 DIAGNOSIS — Z23 Encounter for immunization: Secondary | ICD-10-CM | POA: Diagnosis not present

## 2018-05-22 DIAGNOSIS — R7303 Prediabetes: Secondary | ICD-10-CM | POA: Diagnosis not present

## 2018-05-22 DIAGNOSIS — H269 Unspecified cataract: Secondary | ICD-10-CM

## 2018-05-22 DIAGNOSIS — I10 Essential (primary) hypertension: Secondary | ICD-10-CM | POA: Diagnosis not present

## 2018-05-22 DIAGNOSIS — E785 Hyperlipidemia, unspecified: Secondary | ICD-10-CM

## 2018-05-22 HISTORY — DX: Unspecified cataract: H26.9

## 2018-05-22 LAB — CBC WITH DIFFERENTIAL/PLATELET
BASOS ABS: 0.1 10*3/uL (ref 0.0–0.1)
Basophils Relative: 0.7 % (ref 0.0–3.0)
EOS ABS: 0.2 10*3/uL (ref 0.0–0.7)
Eosinophils Relative: 3.4 % (ref 0.0–5.0)
HCT: 45.6 % (ref 39.0–52.0)
Hemoglobin: 15.4 g/dL (ref 13.0–17.0)
LYMPHS ABS: 2.3 10*3/uL (ref 0.7–4.0)
LYMPHS PCT: 31.6 % (ref 12.0–46.0)
MCHC: 33.7 g/dL (ref 30.0–36.0)
MCV: 87 fl (ref 78.0–100.0)
MONOS PCT: 10.6 % (ref 3.0–12.0)
Monocytes Absolute: 0.8 10*3/uL (ref 0.1–1.0)
NEUTROS ABS: 3.9 10*3/uL (ref 1.4–7.7)
NEUTROS PCT: 53.7 % (ref 43.0–77.0)
PLATELETS: 224 10*3/uL (ref 150.0–400.0)
RBC: 5.24 Mil/uL (ref 4.22–5.81)
RDW: 13.3 % (ref 11.5–15.5)
WBC: 7.3 10*3/uL (ref 4.0–10.5)

## 2018-05-22 LAB — COMPREHENSIVE METABOLIC PANEL
ALK PHOS: 65 U/L (ref 39–117)
ALT: 16 U/L (ref 0–53)
AST: 17 U/L (ref 0–37)
Albumin: 4 g/dL (ref 3.5–5.2)
BUN: 12 mg/dL (ref 6–23)
CALCIUM: 9.5 mg/dL (ref 8.4–10.5)
CO2: 29 mEq/L (ref 19–32)
CREATININE: 1.31 mg/dL (ref 0.40–1.50)
Chloride: 105 mEq/L (ref 96–112)
GFR: 57.54 mL/min — AB (ref >60.00)
GLUCOSE: 127 mg/dL — AB (ref 70–99)
Potassium: 4.4 mEq/L (ref 3.5–5.1)
Sodium: 140 mEq/L (ref 135–145)
TOTAL PROTEIN: 7.1 g/dL (ref 6.0–8.3)
Total Bilirubin: 0.9 mg/dL (ref 0.2–1.2)

## 2018-05-22 LAB — LIPID PANEL
CHOLESTEROL: 172 mg/dL (ref 0–200)
HDL: 25.4 mg/dL — AB (ref >39.00)
LDL Cholesterol: 110 mg/dL — ABNORMAL HIGH (ref 0–99)
NonHDL: 146.42
Total CHOL/HDL Ratio: 7
Triglycerides: 183 mg/dL — ABNORMAL HIGH (ref 0.0–149.0)
VLDL: 36.6 mg/dL (ref 0.0–40.0)

## 2018-05-22 LAB — TSH: TSH: 1.06 u[IU]/mL (ref 0.35–4.50)

## 2018-05-22 LAB — LDL CHOLESTEROL, DIRECT: Direct LDL: 136 mg/dL

## 2018-05-22 LAB — HEMOGLOBIN A1C: HEMOGLOBIN A1C: 6.5 % (ref 4.6–6.5)

## 2018-05-22 NOTE — Progress Notes (Signed)
Subjective:   Kevin Campbell is a 72 y.o. male who presents for Medicare Annual/Subsequent preventive examination.  Review of Systems:  N/A Cardiac Risk Factors include: advanced age (>88men, >89 women);male gender;obesity (BMI >30kg/m2)     Objective:    Vitals: BP 120/80 (BP Location: Left Arm, Patient Position: Sitting, Cuff Size: Normal)   Pulse 71   Temp 97.9 F (36.6 C) (Oral)   Ht 5' 7.5" (1.715 m) Comment: shoes  Wt 244 lb 8 oz (110.9 kg)   SpO2 98%   BMI 37.73 kg/m   Body mass index is 37.73 kg/m.  Advanced Directives 05/22/2018 05/10/2017 02/16/2013 09/26/2012 09/20/2012  Does Patient Have a Medical Advance Directive? No No Patient does not have advance directive Patient does not have advance directive;Patient would not like information Patient does not have advance directive  Would patient like information on creating a medical advance directive? Yes (MAU/Ambulatory/Procedural Areas - Information given) Yes (MAU/Ambulatory/Procedural Areas - Information given) - - -  Pre-existing out of facility DNR order (yellow form or pink MOST form) - - No No No    Tobacco Social History   Tobacco Use  Smoking Status Former Smoker  . Years: 5.00  . Types: Cigars  . Last attempt to quit: 10/11/1990  . Years since quitting: 27.6  Smokeless Tobacco Never Used  Tobacco Comment   usually  chewed on cigars     Counseling given: No Comment: usually  chewed on cigars   Clinical Intake:  Pre-visit preparation completed: Yes  Pain Score: 6      Nutritional Status: BMI > 30  Obese Nutritional Risks: None Diabetes: No  How often do you need to have someone help you when you read instructions, pamphlets, or other written materials from your doctor or pharmacy?: 1 - Never What is the last grade level you completed in school?: 12th grade  Interpreter Needed?: No  Comments: pt lives with spouse Information entered by :: LPinson, LPN  Past Medical History:  Diagnosis  Date  . BPH (benign prostatic hypertrophy)   . Carpal tunnel syndrome on both sides   . Cataract 05/22/2018   bilateral eyes  . Colon polyps 2011   [name]  . GERD (gastroesophageal reflux disease)    watches diet  . Nocturia   . Obstructive sleep apnea   . Overactive bladder    due to prostate seed implant radiation, failed myrbetriq, rapaflo, jalyn, now PTNS (McDiarmid)  . Prostate cancer ([place]) 08/24/12   Adenocarcinoma,gleason=3+4=7,& 3+3=6,PSA=5.75,volume=23cc(5/12)cores positive; treated with XRT and radioactive seed implant, doing well ([place])  . Rheumatoid arthritis ([place])    of hands by Xray but RF, CCP, CRP negative (12/2013)  . Seasonal allergies    Past Surgical History:  Procedure Laterality Date  . CARDIOVASCULAR STRESS TEST  11-26-2010   Normal lexiscan nuclear study with inferior thinning but no ischemia  . CLAVICLE SURGERY Left 1968   REMOVAL OF BONE SPUR FROM PREVIOUS FX YEARS AGO  . COLONOSCOPY  2011   1 polyp sigmoid (hyperplastic), diverticulosis, int hemorrhoids [facility])  . KNEE ARTHROSCOPY Left JAN 2013  . KNEE ARTHROSCOPY Right 12/2017   Dr. [facility]  . PROSTATE BIOPSY  08/24/12   Adenocarcinoma  . RADIOACTIVE SEED IMPLANT N/A 02/16/2013   Procedure: RADIOACTIVE PROSTATE SEED IMPLANT;  Surgeon: Claybon Jabs, MD  . SHOULDER ARTHROSCOPY WITH BICEPS TENDON REPAIR Right APRIL 2013  . TOTAL KNEE ARTHROPLASTY  09/26/2012   Procedure: TOTAL KNEE ARTHROPLASTY;  Surgeon: Sydnee Cabal,  MD;  Location: [facility];  Service: Orthopedics;  Laterality: Left;   Family History  Problem Relation Age of Onset  . Coronary artery disease Mother 40       3vCABG  . Arthritis Mother   . Rheumatologic disease Mother   . Cancer Father        lung, smoker  . Cancer Sister        twin sister - breast  . Stroke Neg Hx   . Diabetes Neg Hx    Social History   Socioeconomic History  . Marital status: Married    Spouse name: Not on file  . Number of  children: Not on file  . Years of education: Not on file  . Highest education level: Not on file  Occupational History  . Not on file  Social Needs  . Financial resource strain: Not on file  . Food insecurity:    Worry: Not on file    Inability: Not on file  . Transportation needs:    Medical: Not on file    Non-medical: Not on file  Tobacco Use  . Smoking status: Former Smoker    Years: 5.00    Types: Cigars    Last attempt to quit: 10/11/1990    Years since quitting: 27.6  . Smokeless tobacco: Never Used  . Tobacco comment: usually  chewed on cigars  Substance and Sexual Activity  . Alcohol use: No  . Drug use: No  . Sexual activity: Not on file  Lifestyle  . Physical activity:    Days per week: Not on file    Minutes per session: Not on file  . Stress: Not on file  Relationships  . Social connections:    Talks on phone: Not on file    Gets together: Not on file    Attends religious service: Not on file    Active member of club or organization: Not on file    Attends meetings of clubs or organizations: Not on file    Relationship status: Not on file  Other Topics Concern  . Not on file  Social History Narrative   Caffeine: 2 cups coffee occasionally, soda throughout the day   Lives with wife, no pets.  2 grown children   Occupation: truck driver   Edu: HS   Activity: sedentary   Diet: rare water, good fruits and vegetables    Outpatient Encounter Medications as of 05/22/2018  Medication Sig  . amLODipine (NORVASC) 5 MG tablet Take 1 tablet (5 mg total) by mouth daily.  [name] aspirin EC 81 MG tablet Take 81 mg by mouth daily.  . tamsulosin (FLOMAX) 0.4 MG CAPS capsule Take 1 capsule (0.4 mg total) by mouth daily. Take in the mornings  . triamcinolone cream (KENALOG) 0.1 % Apply 1 application topically 2 (two) times daily. Apply to AA.   No facility-administered encounter medications on file as of 05/22/2018.     Activities of Daily Living In your present state of  health, do you have any difficulty performing the following activities: 05/22/2018  Hearing? N  Vision? N  Difficulty concentrating or making decisions? N  Walking or climbing stairs? Y  Comment easily fatigued when walking long distances  Dressing or bathing? N  Doing errands, shopping? N  Preparing Food and eating ? N  Using the Toilet? N  In the past six months, have you accidently leaked urine? N  Do you have problems with loss of bowel control? N  Managing your  Medications? N  Managing your Finances? N  Housekeeping or managing your Housekeeping? N  Some recent data might be hidden    Patient Care Team: Ria Bush, MD as PCP - General (Family Medicine)   Assessment:   This is a routine wellness examination for [place].   Hearing Screening   125Hz  250Hz  500Hz  1000Hz  2000Hz  3000Hz  4000Hz  6000Hz  8000Hz   Right ear:   40 40 40  0    Left ear:   40 40 40  0    Vision Screening Comments: Vision exam in March 2019 with Dr. Maryruth Hancock B.    Exercise Activities and Dietary recommendations Current Exercise Habits: The patient does not participate in regular exercise at present, Exercise limited by: None identified  Goals    . Patient Stated     Starting 05/22/2018, I will continue to take medications as prescribed.        Fall Risk Fall Risk  05/22/2018 05/11/2017 05/10/2017  Falls in the past year? Yes No No  Comment accidental fall off lawn mower; injury to right rib area - -  Number falls in past yr: 1 - -  Injury with Fall? Yes - -   Depression Screen PHQ 2/9 Scores 05/22/2018 05/10/2017  PHQ - 2 Score 0 0  PHQ- 9 Score 0 -    Cognitive Function MMSE - Mini Mental State Exam 05/22/2018 05/10/2017  Orientation to time 5 5  Orientation to Place 5 5  Registration 3 3  Attention/ Calculation 0 0  Recall 3 3  Language- name 2 objects 0 0  Language- repeat 1 1  Language- follow 3 step command 3 3  Language- read & follow direction 0 0  Write a sentence 0 0  Copy design  0 0  Total score 20 20       PLEASE NOTE: A Mini-Cog screen was completed. Maximum score is 20. A value of 0 denotes this part of Folstein MMSE was not completed or the patient failed this part of the Mini-Cog screening.   Mini-Cog Screening Orientation to Time - Max 5 pts Orientation to Place - Max 5 pts Registration - Max 3 pts Recall - Max 3 pts Language Repeat - Max 1 pts Language Follow 3 Step Command - Max 3 pts   Immunization History  Administered Date(s) Administered  . Influenza Whole 06/22/2012  . Influenza, High Dose Seasonal PF 08/01/2017  . Influenza-Unspecified 07/08/2014, 07/12/2016  . Pneumococcal Conjugate-13 05/22/2018  . Pneumococcal Polysaccharide-23 10/18/2011  . Td 10/17/2006      Screening Tests Health Maintenance  Topic Date Due  . DTaP/Tdap/Td (1 - Tdap) 10/11/2018 (Originally 10/18/2006)  . INFLUENZA VACCINE  01/09/2019 (Originally 05/11/2018)  . TETANUS/TDAP  10/11/2018  . PNA vac Low Risk Adult (2 of 2 - PPSV23) 05/23/2019  . COLONOSCOPY  04/20/2020  . Hepatitis C Screening  Completed      Plan:     I have personally reviewed, addressed, and noted the following in the patient's chart:  A. Medical and social history B. Use of alcohol, tobacco or illicit drugs  C. Current medications and supplements D. Functional ability and status E.  Nutritional status F.  Physical activity G. Advance directives H. List of other physicians I.  Hospitalizations, surgeries, and ER visits in previous 12 months J.  [place] to include hearing, vision, cognitive, depression L. Referrals and appointments - none  In addition, I have reviewed and discussed with patient certain preventive protocols, quality metrics, and best  practice recommendations. A written personalized care plan for preventive services as well as general preventive health recommendations were provided to patient.  See attached scanned questionnaire for additional information.    Signed,   Lindell Noe, MHA, BS, LPN Health Coach

## 2018-05-22 NOTE — Patient Instructions (Signed)
Mr. Culton , Thank you for taking time to come for your Medicare Wellness Visit. I appreciate your ongoing commitment to your health goals. Please review the following plan we discussed and let me know if I can assist you in the future.   These are the goals we discussed: Goals    . Patient Stated     Starting 05/22/2018, I will continue to take medications as prescribed.        This is a list of the screening recommended for you and due dates:  Health Maintenance  Topic Date Due  . DTaP/Tdap/Td vaccine (1 - Tdap) 10/11/2018*  . Flu Shot  01/09/2019*  . Tetanus Vaccine  10/11/2018  . Pneumonia vaccines (2 of 2 - PPSV23) 05/23/2019  . Colon Cancer Screening  04/20/2020  .  Hepatitis C: One time screening is recommended by Center for Disease Control  (CDC) for  adults born from [birth year] through 1963.   Completed  *Topic was postponed. The date shown is not the original due date.   Preventive Care for Adults  A healthy lifestyle and preventive care can promote health and wellness. Preventive health guidelines for adults include the following key practices.  . A routine yearly physical is a good way to check with your health care provider about your health and preventive screening. It is a chance to share any concerns and updates on your health and to receive a thorough exam.  . Visit your dentist for a routine exam and preventive care every 6 months. Brush your teeth twice a day and floss once a day. Good oral hygiene prevents tooth decay and gum disease.  . The frequency of eye exams is based on your age, health, family medical history, use  of contact lenses, and other factors. Follow your health care provider's recommendations for frequency of eye exams.  . Eat a healthy diet. Foods like vegetables, fruits, whole grains, low-fat dairy products, and lean protein foods contain the nutrients you need without too many calories. Decrease your intake of foods high in solid fats, added sugars,  and salt. Eat the right amount of calories for you. Get information about a proper diet from your health care provider, if necessary.  . Regular physical exercise is one of the most important things you can do for your health. Most adults should get at least 150 minutes of moderate-intensity exercise (any activity that increases your heart rate and causes you to sweat) each week. In addition, most adults need muscle-strengthening exercises on 2 or more days a week.  Silver Sneakers may be a benefit available to you. To determine eligibility, you may visit the website: www.silversneakers.com or contact program at (434) 820-9866 Mon-Fri between 8AM-8PM.   . Maintain a healthy weight. The body mass index (BMI) is a screening tool to identify possible weight problems. It provides an estimate of body fat based on height and weight. Your health care provider can find your BMI and can help you achieve or maintain a healthy weight.   For adults 20 years and older: ? A BMI below 18.5 is considered underweight. ? A BMI of 18.5 to 24.9 is normal. ? A BMI of 25 to 29.9 is considered overweight. ? A BMI of 30 and above is considered obese.   . Maintain normal blood lipids and cholesterol levels by exercising and minimizing your intake of saturated fat. Eat a balanced diet with plenty of fruit and vegetables. Blood tests for lipids and cholesterol should begin at age 19  and be repeated every 5 years. If your lipid or cholesterol levels are high, you are over 50, or you are at high risk for heart disease, you may need your cholesterol levels checked more frequently. Ongoing high lipid and cholesterol levels should be treated with medicines if diet and exercise are not working.  . If you smoke, find out from your health care provider how to quit. If you do not use tobacco, please do not start.  . If you choose to drink alcohol, please do not consume more than 2 drinks per day. One drink is considered to be 12  ounces (355 mL) of beer, 5 ounces (148 mL) of wine, or 1.5 ounces (44 mL) of liquor.  . If you are 55-74 years old, ask your health care provider if you should take aspirin to prevent strokes.  . Use sunscreen. Apply sunscreen liberally and repeatedly throughout the day. You should seek shade when your shadow is shorter than you. Protect yourself by wearing long sleeves, pants, a wide-brimmed hat, and sunglasses year round, whenever you are outdoors.  . Once a month, do a whole body skin exam, using a mirror to look at the skin on your back. Tell your health care provider of new moles, moles that have irregular borders, moles that are larger than a pencil eraser, or moles that have changed in shape or color.

## 2018-05-22 NOTE — Progress Notes (Signed)
PCP notes:   Health maintenance:  PCV13 - administered Flu vaccine - administered  Abnormal screenings:   Hearing - failed  Hearing Screening   125Hz  250Hz  500Hz  1000Hz  2000Hz  3000Hz  4000Hz  6000Hz  8000Hz   Right ear:   40 40 40  0    Left ear:   40 40 40  0     Fall risk - hx of single fall Fall Risk  05/22/2018 05/11/2017 05/10/2017  Falls in the past year? Yes No No  Comment accidental fall off lawn mower; injury to right rib area - -  Number falls in past yr: 1 - -  Injury with Fall? Yes - -    Patient concerns:   Right knee pain - recent arthroscopy in March 2019. Pain scale: 6/10  Rash to right lower extremity - Kenalog was ineffective. Using antibiotic ointment. Still experiencing itching and discomfort.  Nurse concerns:  None  Next PCP appt:   05/29/2018 @ 0930

## 2018-05-29 ENCOUNTER — Encounter: Payer: Self-pay | Admitting: Family Medicine

## 2018-05-29 ENCOUNTER — Ambulatory Visit (INDEPENDENT_AMBULATORY_CARE_PROVIDER_SITE_OTHER): Payer: PPO | Admitting: Family Medicine

## 2018-05-29 VITALS — BP 136/80 | HR 58 | Temp 98.6°F | Ht 67.5 in | Wt 244.8 lb

## 2018-05-29 DIAGNOSIS — E119 Type 2 diabetes mellitus without complications: Secondary | ICD-10-CM

## 2018-05-29 DIAGNOSIS — N401 Enlarged prostate with lower urinary tract symptoms: Secondary | ICD-10-CM

## 2018-05-29 DIAGNOSIS — N183 Chronic kidney disease, stage 3 unspecified: Secondary | ICD-10-CM

## 2018-05-29 DIAGNOSIS — Z0000 Encounter for general adult medical examination without abnormal findings: Secondary | ICD-10-CM

## 2018-05-29 DIAGNOSIS — R21 Rash and other nonspecific skin eruption: Secondary | ICD-10-CM

## 2018-05-29 DIAGNOSIS — Z7189 Other specified counseling: Secondary | ICD-10-CM | POA: Diagnosis not present

## 2018-05-29 DIAGNOSIS — R351 Nocturia: Secondary | ICD-10-CM

## 2018-05-29 DIAGNOSIS — C61 Malignant neoplasm of prostate: Secondary | ICD-10-CM

## 2018-05-29 DIAGNOSIS — G4733 Obstructive sleep apnea (adult) (pediatric): Secondary | ICD-10-CM | POA: Diagnosis not present

## 2018-05-29 DIAGNOSIS — E785 Hyperlipidemia, unspecified: Secondary | ICD-10-CM | POA: Diagnosis not present

## 2018-05-29 DIAGNOSIS — I10 Essential (primary) hypertension: Secondary | ICD-10-CM

## 2018-05-29 NOTE — Assessment & Plan Note (Signed)
Continue flomax  

## 2018-05-29 NOTE — Assessment & Plan Note (Signed)
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 Followed by pulm.

## 2018-05-29 NOTE — Assessment & Plan Note (Signed)
Preventative protocols reviewed and updated unless pt declined. Discussed healthy diet and lifestyle.  

## 2018-05-29 NOTE — Assessment & Plan Note (Signed)
Chronic, stable. Continue to monitor.  

## 2018-05-29 NOTE — Assessment & Plan Note (Addendum)
Chronic, off statin. Reviewed dietary changes to improve lipid control. Will need to consider restarting statin for better control.  The 10-year ASCVD risk score Mikey Bussing DC Brooke Bonito., et al., 2013) is: 45.2%   Values used to calculate the score:     Age: 72 years     Sex: Male     Is Non-Hispanic African American: No     Diabetic: Yes     Tobacco smoker: No     Systolic Blood Pressure: 102 mmHg     Is BP treated: Yes     HDL Cholesterol: 25.4 mg/dL     Total Cholesterol: 172 mg/dL

## 2018-05-29 NOTE — Assessment & Plan Note (Addendum)
S/p seed implants. Continue routine urology f/u.

## 2018-05-29 NOTE — Patient Instructions (Addendum)
If interested, check with pharmacy about new 2 shot shingles series (shingrix).  Follow up on advanced directive forms.  Your sugar levels were elevated this year - in diet controlled diabetes range. Watch added sugars and sweetened beverages in diet - cut down on sodas. Return in 4 months for sugar follow up.  Your cholesterol levels were also a bit high - work on low cholesterol diet.  For leg rash - return to see skin doctor for follow up visit.  Start walking routine for goal weight loss.   Health Maintenance, Male A healthy lifestyle and preventive care is important for your health and wellness. Ask your health care provider about what schedule of regular examinations is right for you. What should I know about weight and diet? Eat a Healthy Diet  Eat plenty of vegetables, fruits, whole grains, low-fat dairy products, and lean protein.  Do not eat a lot of foods high in solid fats, added sugars, or salt.  Maintain a Healthy Weight Regular exercise can help you achieve or maintain a healthy weight. You should:  Do at least 150 minutes of exercise each week. The exercise should increase your heart rate and make you sweat (moderate-intensity exercise).  Do strength-training exercises at least twice a week.  Watch Your Levels of Cholesterol and Blood Lipids  Have your blood tested for lipids and cholesterol every 5 years starting at 70 years of age. If you are at high risk for heart disease, you should start having your blood tested when you are 70 years old. You may need to have your cholesterol levels checked more often if: ? Your lipid or cholesterol levels are high. ? You are older than 70 years of age. ? You are at high risk for heart disease.  What should I know about cancer screening? Many types of cancers can be detected early and may often be prevented. Lung Cancer  You should be screened every year for lung cancer if: ? You are a current smoker who has smoked for at least  30 years. ? You are a former smoker who has quit within the past 15 years.  Talk to your health care provider about your screening options, when you should start screening, and how often you should be screened.  Colorectal Cancer  Routine colorectal cancer screening usually begins at 70 years of age and should be repeated every 5-10 years until you are 70 years old. You may need to be screened more often if early forms of precancerous polyps or small growths are found. Your health care provider may recommend screening at an earlier age if you have risk factors for colon cancer.  Your health care provider may recommend using home test kits to check for hidden blood in the stool.  A small camera at the end of a tube can be used to examine your colon (sigmoidoscopy or colonoscopy). This checks for the earliest forms of colorectal cancer.  Prostate and Testicular Cancer  Depending on your age and overall health, your health care provider may do certain tests to screen for prostate and testicular cancer.  Talk to your health care provider about any symptoms or concerns you have about testicular or prostate cancer.  Skin Cancer  Check your skin from head to toe regularly.  Tell your health care provider about any new moles or changes in moles, especially if: ? There is a change in a mole's size, shape, or color. ? You have a mole that is larger than a  pencil eraser.  Always use sunscreen. Apply sunscreen liberally and repeat throughout the day.  Protect yourself by wearing long sleeves, pants, a wide-brimmed hat, and sunglasses when outside.  What should I know about heart disease, diabetes, and high blood pressure?  If you are 60-40 years of age, have your blood pressure checked every 3-5 years. If you are 84 years of age or older, have your blood pressure checked every year. You should have your blood pressure measured twice-once when you are at a hospital or clinic, and once when you  are not at a hospital or clinic. Record the average of the two measurements. To check your blood pressure when you are not at a hospital or clinic, you can use: ? An automated blood pressure machine at a pharmacy. ? A home blood pressure monitor.  Talk to your health care provider about your target blood pressure.  If you are between 45-84 years old, ask your health care provider if you should take aspirin to prevent heart disease.  Have regular diabetes screenings by checking your fasting blood sugar level. ? If you are at a normal weight and have a low risk for diabetes, have this test once every three years after the age of 16. ? If you are overweight and have a high risk for diabetes, consider being tested at a younger age or more often.  A one-time screening for abdominal aortic aneurysm (AAA) by ultrasound is recommended for men aged 66-75 years who are current or former smokers. What should I know about preventing infection? Hepatitis B If you have a higher risk for hepatitis B, you should be screened for this virus. Talk with your health care provider to find out if you are at risk for hepatitis B infection. Hepatitis C Blood testing is recommended for:  Everyone born from [birth year] through 1963.  Anyone with known risk factors for hepatitis C.  Sexually Transmitted Diseases (STDs)  You should be screened each year for STDs including gonorrhea and chlamydia if: ? You are sexually active and are younger than 70 years of age. ? You are older than 70 years of age and your health care provider tells you that you are at risk for this type of infection. ? Your sexual activity has changed since you were last screened and you are at an increased risk for chlamydia or gonorrhea. Ask your health care provider if you are at risk.  Talk with your health care provider about whether you are at high risk of being infected with HIV. Your health care provider may recommend a prescription medicine to  help prevent HIV infection.  What else can I do?  Schedule regular health, dental, and eye exams.  Stay current with your vaccines (immunizations).  Do not use any tobacco products, such as cigarettes, chewing tobacco, and e-cigarettes. If you need help quitting, ask your health care provider.  Limit alcohol intake to no more than 2 drinks per day. One drink equals 12 ounces of beer, 5 ounces of wine, or 1 ounces of hard liquor.  Do not use street drugs.  Do not share needles.  Ask your health care provider for help if you need support or information about quitting drugs.  Tell your health care provider if you often feel depressed.  Tell your health care provider if you have ever been abused or do not feel safe at home. This information is not intended to replace advice given to you by your health care provider. Make sure  you discuss any questions you have with your health care provider. Document Released: 03/25/2008 Document Revised: 05/26/2016 Document Reviewed: 07/01/2015 Elsevier Interactive Patient Education  [employer].

## 2018-05-29 NOTE — Assessment & Plan Note (Signed)
Advanced directive - packet provided last year. HCPOA would be daughter [name].

## 2018-05-29 NOTE — Assessment & Plan Note (Signed)
Chronic, stable. Continue current regimen. 

## 2018-05-29 NOTE — Assessment & Plan Note (Signed)
Slow weight loss noted. Congratulated. Reviewed healthy healthy diet and lifestyle changes to affect sustainable weight loss. Encouraged incorporate walking routine into diet.

## 2018-05-29 NOTE — Assessment & Plan Note (Signed)
Persistent - encouraged derm f/u.

## 2018-05-29 NOTE — Assessment & Plan Note (Signed)
Reviewed progression of A1c, encouraged he follow diabetic diet as well as regular exercise routine. RTC 37mo DM f/u visit.

## 2018-05-29 NOTE — Progress Notes (Signed)
BP 136/80 (BP Location: Left Arm, Patient Position: Sitting, Cuff Size: Normal)   Pulse (!) 58   Temp 98.6 F (37 C) (Oral)   Ht 5' 7.5" (1.715 m)   Wt 244 lb 12 oz (111 kg)   SpO2 96%   BMI 37.77 kg/m    CC: CPE  Subjective:    Patient ID: Kevin Campbell, male    DOB: [birth year], 72 y.o.   MRN: [id number]  HPI: Kevin Campbell is a 72 y.o. male presenting on 05/29/2018 for Annual Exam (Pt 2.) and Rash (Located on lower left LE. Area still itches and burns.)   Saw Lesia last week for medicare wellness visit. Note reviewed. Fall off lawnmower, injured ribs now better.  Cares for wife with dementia.   Rash to RLE - kenalog ineffective, now using abx ointment. Saw derm - dx nummular eczema vs tinea corporis treated with moisturizing cream and clobetasol cream. Very itchy and burning. He has been using witch hazel, peroxide, alcohol and abx cream. Planned f/u with derm later this month.   Using CPAP but struggling. Trying to get at least 4 hours/night. 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   Preventative: Colonoscopy 2011 - 1 HP, diverticulosis Vira Agar)  H/o prostate cancer 2013 s/p radioactive seed implant (MacDiarmid) sees urology yearly.  Flu shot yearly Pneumovax 2013, declines prevnar - would like to get when he gets flu shot.  Td 2008 shingrix - discussed.  Advanced directive - packet provided yesterday. HCPOA would be daughter [name].  Seat belt use discussed.  Sunscreen use discussed. No changing moles on skin.  Ex smoker - quit 1992 Alcohol - none Dentist - has not seen recently - has upper plate Eye exam - yearly, watching cataracts   Caffeine: 2 cups coffee occasionally, soda throughout the day Lives with wife, no pets.  2 grown children Occupation: truck driver Edu: HS Activity: sedentary, some yardwork  Diet: some water with yardwork, good fruits and vegetables  Relevant past medical, surgical, family and social history reviewed and updated as indicated. Interim  medical history since our last visit reviewed. Allergies and medications reviewed and updated. Outpatient Medications Prior to Visit  Medication Sig Dispense Refill  . amLODipine (NORVASC) 5 MG tablet Take 1 tablet (5 mg total) by mouth daily. 90 tablet 3  . aspirin EC 81 MG tablet Take 81 mg by mouth daily.    . tamsulosin (FLOMAX) 0.4 MG CAPS capsule Take 1 capsule (0.4 mg total) by mouth daily. Take in the mornings 30 capsule 3  . triamcinolone cream (KENALOG) 0.1 % Apply 1 application topically 2 (two) times daily. Apply to AA. 80 g 0   No facility-administered medications prior to visit.      Per HPI unless specifically indicated in ROS section below Review of Systems  Constitutional: Positive for fatigue. Negative for activity change, appetite change, chills, fever and unexpected weight change.       Doesn't feel well since CPAP started  HENT: Negative for hearing loss.   Eyes: Negative for visual disturbance.  Respiratory: Negative for cough, chest tightness, shortness of breath and wheezing.   Cardiovascular: Negative for chest pain, palpitations and leg swelling.  Gastrointestinal: Negative for abdominal distention, abdominal pain, blood in stool, constipation, diarrhea, nausea and vomiting.  Genitourinary: Negative for difficulty urinating and hematuria.  Musculoskeletal: Negative for arthralgias, myalgias and neck pain.  Skin: Negative for rash.  Neurological: Negative for dizziness, seizures, syncope and headaches.  Hematological: Negative for adenopathy. Does not  bruise/bleed easily.  Psychiatric/Behavioral: Negative for dysphoric mood. The patient is not nervous/anxious.        Objective:    BP 136/80 (BP Location: Left Arm, Patient Position: Sitting, Cuff Size: Normal)   Pulse (!) 58   Temp 98.6 F (37 C) (Oral)   Ht 5' 7.5" (1.715 m)   Wt 244 lb 12 oz (111 kg)   SpO2 96%   BMI 37.77 kg/m   Wt Readings from Last 3 Encounters:  05/29/18 244 lb 12 oz (111 kg)    05/22/18 244 lb 8 oz (110.9 kg)  04/18/18 246 lb (111.6 kg)    Physical Exam  Constitutional: He is oriented to person, place, and time. He appears well-developed and well-nourished. No distress.  HENT:  Head: Normocephalic and atraumatic.  Right Ear: Hearing, tympanic membrane, external ear and ear canal normal.  Left Ear: Hearing, tympanic membrane, external ear and ear canal normal.  Nose: Nose normal.  Mouth/Throat: Uvula is midline, oropharynx is clear and moist and mucous membranes are normal. No oropharyngeal exudate, posterior oropharyngeal edema or posterior oropharyngeal erythema.  Eyes: Pupils are equal, round, and reactive to light. Conjunctivae and EOM are normal. No scleral icterus.  Neck: Normal range of motion. Neck supple. Carotid bruit is not present. No thyromegaly present.  Cardiovascular: Normal rate, regular rhythm, normal heart sounds and intact distal pulses.  No murmur heard. Pulses:      Radial pulses are 2+ on the right side, and 2+ on the left side.  Pulmonary/Chest: Effort normal and breath sounds normal. No respiratory distress. He has no wheezes. He has no rales.  Abdominal: Soft. Bowel sounds are normal. He exhibits no distension and no mass. There is no tenderness. There is no rebound and no guarding.  Musculoskeletal: Normal range of motion. He exhibits no edema.  Lymphadenopathy:    He has no cervical adenopathy.  Neurological: He is alert and oriented to person, place, and time.  CN grossly intact, station and gait intact  Skin: Skin is warm and dry. No rash noted.  Psychiatric: He has a normal mood and affect. His behavior is normal. Judgment and thought content normal.  Nursing note and vitals reviewed.  Results for orders placed or performed in visit on 05/22/18  Hemoglobin A1c  Result Value Ref Range   Hgb A1c MFr Bld 6.5 4.6 - 6.5 %  Lipid panel  Result Value Ref Range   Cholesterol 172 0 - 200 mg/dL   Triglycerides 183.0 (H) 0.0 - 149.0  mg/dL   HDL 25.40 (L) >39.00 mg/dL   VLDL 36.6 0.0 - 40.0 mg/dL   LDL Cholesterol 110 (H) 0 - 99 mg/dL   Total CHOL/HDL Ratio 7    NonHDL 146.42   TSH  Result Value Ref Range   TSH 1.06 0.35 - 4.50 uIU/mL  Comprehensive metabolic panel  Result Value Ref Range   Sodium 140 135 - 145 mEq/L   Potassium 4.4 3.5 - 5.1 mEq/L   Chloride 105 96 - 112 mEq/L   CO2 29 19 - 32 mEq/L   Glucose, Bld 127 (H) 70 - 99 mg/dL   BUN 12 6 - 23 mg/dL   Creatinine, Ser 1.31 0.40 - 1.50 mg/dL   Total Bilirubin 0.9 0.2 - 1.2 mg/dL   Alkaline Phosphatase 65 39 - 117 U/L   AST 17 0 - 37 U/L   ALT 16 0 - 53 U/L   Total Protein 7.1 6.0 - 8.3 g/dL   Albumin 4.0  3.5 - 5.2 g/dL   Calcium 9.5 8.4 - 10.5 mg/dL   GFR 57.54 (L) >60.00 mL/min  CBC with Differential/Platelet  Result Value Ref Range   WBC 7.3 4.0 - 10.5 K/uL   RBC 5.24 4.22 - 5.81 Mil/uL   Hemoglobin 15.4 13.0 - 17.0 g/dL   HCT 45.6 39.0 - 52.0 %   MCV 87.0 78.0 - 100.0 fl   MCHC 33.7 30.0 - 36.0 g/dL   RDW 13.3 11.5 - 15.5 %   Platelets 224.0 150.0 - 400.0 K/uL   Neutrophils Relative % 53.7 43.0 - 77.0 %   Lymphocytes Relative 31.6 12.0 - 46.0 %   Monocytes Relative 10.6 3.0 - 12.0 %   Eosinophils Relative 3.4 0.0 - 5.0 %   Basophils Relative 0.7 0.0 - 3.0 %   Neutro Abs 3.9 1.4 - 7.7 K/uL   Lymphs Abs 2.3 0.7 - 4.0 K/uL   Monocytes Absolute 0.8 0.1 - 1.0 K/uL   Eosinophils Absolute 0.2 0.0 - 0.7 K/uL   Basophils Absolute 0.1 0.0 - 0.1 K/uL  LDL Cholesterol, Direct  Result Value Ref Range   Direct LDL 136.0 mg/dL      Assessment & Plan:   Problem List Items Addressed This Visit    Skin rash    Persistent - encouraged derm f/u.      Severe obesity (BMI 35.0-39.9) with comorbidity ([place])    Slow weight loss noted. Congratulated. Reviewed healthy healthy diet and lifestyle changes to affect sustainable weight loss. Encouraged incorporate walking routine into diet.       Primary prostate adenocarcinoma ([place])    S/p seed implants.  Continue routine urology f/u.      OSA (obstructive sleep apnea)    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 Followed by pulm.       Healthcare maintenance - Primary    Preventative protocols reviewed and updated unless pt declined. Discussed healthy diet and lifestyle.       Essential hypertension    Chronic, stable. Continue current regimen.       Dyslipidemia    Chronic, off statin. Reviewed dietary changes to improve lipid control. Will need to consider restarting statin for better control.  The 10-year ASCVD risk score Mikey Bussing DC Brooke Bonito., et al., 2013) is: 45.2%   Values used to calculate the score:     Age: 49 years     Sex: Male     Is Non-Hispanic African American: No     Diabetic: Yes     Tobacco smoker: No     Systolic Blood Pressure: 856 mmHg     Is BP treated: Yes     HDL Cholesterol: 25.4 mg/dL     Total Cholesterol: 172 mg/dL       Diet-controlled diabetes mellitus ([place])    Reviewed progression of A1c, encouraged he follow diabetic diet as well as regular exercise routine. RTC 64mo DM f/u visit.       CKD (chronic kidney disease) stage 3, GFR 30-59 ml/min (HCC)    Chronic, stable. Continue to monitor.       BPH associated with nocturia    Continue flomax.      Advanced care planning/counseling discussion    Advanced directive - packet provided last year. HCPOA would be daughter [name].           No orders of the defined types were placed in this encounter.  No  orders of the defined types were placed in this encounter.   Follow up plan: Return in about 4 months (around 09/28/2018) for follow up visit.  Ria Bush, MD

## 2018-06-04 NOTE — Progress Notes (Signed)
I reviewed health advisor's note, was available for consultation, and agree with documentation and plan.  

## 2018-06-17 DIAGNOSIS — G4733 Obstructive sleep apnea (adult) (pediatric): Secondary | ICD-10-CM | POA: Diagnosis not present

## 2018-06-17 DIAGNOSIS — G4719 Other hypersomnia: Secondary | ICD-10-CM | POA: Diagnosis not present

## 2018-06-17 DIAGNOSIS — R0683 Snoring: Secondary | ICD-10-CM | POA: Diagnosis not present

## 2018-06-17 DIAGNOSIS — E668 Other obesity: Secondary | ICD-10-CM | POA: Diagnosis not present

## 2018-07-17 DIAGNOSIS — R0683 Snoring: Secondary | ICD-10-CM | POA: Diagnosis not present

## 2018-07-17 DIAGNOSIS — G4733 Obstructive sleep apnea (adult) (pediatric): Secondary | ICD-10-CM | POA: Diagnosis not present

## 2018-07-17 DIAGNOSIS — E668 Other obesity: Secondary | ICD-10-CM | POA: Diagnosis not present

## 2018-07-17 DIAGNOSIS — G4719 Other hypersomnia: Secondary | ICD-10-CM | POA: Diagnosis not present

## 2018-07-24 ENCOUNTER — Other Ambulatory Visit: Payer: Self-pay | Admitting: Family Medicine

## 2018-07-25 DIAGNOSIS — M1711 Unilateral primary osteoarthritis, right knee: Secondary | ICD-10-CM | POA: Diagnosis not present

## 2018-08-17 DIAGNOSIS — R0683 Snoring: Secondary | ICD-10-CM | POA: Diagnosis not present

## 2018-08-17 DIAGNOSIS — G4733 Obstructive sleep apnea (adult) (pediatric): Secondary | ICD-10-CM | POA: Diagnosis not present

## 2018-08-17 DIAGNOSIS — E668 Other obesity: Secondary | ICD-10-CM | POA: Diagnosis not present

## 2018-08-17 DIAGNOSIS — G4719 Other hypersomnia: Secondary | ICD-10-CM | POA: Diagnosis not present

## 2018-09-16 DIAGNOSIS — G4719 Other hypersomnia: Secondary | ICD-10-CM | POA: Diagnosis not present

## 2018-09-16 DIAGNOSIS — R0683 Snoring: Secondary | ICD-10-CM | POA: Diagnosis not present

## 2018-09-16 DIAGNOSIS — G4733 Obstructive sleep apnea (adult) (pediatric): Secondary | ICD-10-CM | POA: Diagnosis not present

## 2018-09-16 DIAGNOSIS — E668 Other obesity: Secondary | ICD-10-CM | POA: Diagnosis not present

## 2018-10-11 DIAGNOSIS — G4733 Obstructive sleep apnea (adult) (pediatric): Secondary | ICD-10-CM | POA: Diagnosis not present

## 2018-10-11 DIAGNOSIS — E668 Other obesity: Secondary | ICD-10-CM | POA: Diagnosis not present

## 2018-10-11 DIAGNOSIS — G4719 Other hypersomnia: Secondary | ICD-10-CM | POA: Diagnosis not present

## 2018-10-11 DIAGNOSIS — R0683 Snoring: Secondary | ICD-10-CM | POA: Diagnosis not present

## 2018-10-17 DIAGNOSIS — G4719 Other hypersomnia: Secondary | ICD-10-CM | POA: Diagnosis not present

## 2018-10-17 DIAGNOSIS — G4733 Obstructive sleep apnea (adult) (pediatric): Secondary | ICD-10-CM | POA: Diagnosis not present

## 2018-10-17 DIAGNOSIS — E668 Other obesity: Secondary | ICD-10-CM | POA: Diagnosis not present

## 2018-10-17 DIAGNOSIS — R0683 Snoring: Secondary | ICD-10-CM | POA: Diagnosis not present

## 2018-11-17 DIAGNOSIS — G4719 Other hypersomnia: Secondary | ICD-10-CM | POA: Diagnosis not present

## 2018-11-17 DIAGNOSIS — R0683 Snoring: Secondary | ICD-10-CM | POA: Diagnosis not present

## 2018-11-17 DIAGNOSIS — G4733 Obstructive sleep apnea (adult) (pediatric): Secondary | ICD-10-CM | POA: Diagnosis not present

## 2018-11-17 DIAGNOSIS — E668 Other obesity: Secondary | ICD-10-CM | POA: Diagnosis not present

## 2018-12-16 DIAGNOSIS — G4733 Obstructive sleep apnea (adult) (pediatric): Secondary | ICD-10-CM | POA: Diagnosis not present

## 2018-12-16 DIAGNOSIS — E668 Other obesity: Secondary | ICD-10-CM | POA: Diagnosis not present

## 2018-12-16 DIAGNOSIS — R0683 Snoring: Secondary | ICD-10-CM | POA: Diagnosis not present

## 2018-12-16 DIAGNOSIS — G4719 Other hypersomnia: Secondary | ICD-10-CM | POA: Diagnosis not present

## 2018-12-19 DIAGNOSIS — Z96652 Presence of left artificial knee joint: Secondary | ICD-10-CM | POA: Diagnosis not present

## 2018-12-19 DIAGNOSIS — M25562 Pain in left knee: Secondary | ICD-10-CM | POA: Diagnosis not present

## 2018-12-19 DIAGNOSIS — M25561 Pain in right knee: Secondary | ICD-10-CM | POA: Diagnosis not present

## 2019-01-16 DIAGNOSIS — G4733 Obstructive sleep apnea (adult) (pediatric): Secondary | ICD-10-CM | POA: Diagnosis not present

## 2019-01-16 DIAGNOSIS — R0683 Snoring: Secondary | ICD-10-CM | POA: Diagnosis not present

## 2019-01-16 DIAGNOSIS — G4719 Other hypersomnia: Secondary | ICD-10-CM | POA: Diagnosis not present

## 2019-01-16 DIAGNOSIS — E668 Other obesity: Secondary | ICD-10-CM | POA: Diagnosis not present

## 2019-02-15 DIAGNOSIS — G4719 Other hypersomnia: Secondary | ICD-10-CM | POA: Diagnosis not present

## 2019-02-15 DIAGNOSIS — G4733 Obstructive sleep apnea (adult) (pediatric): Secondary | ICD-10-CM | POA: Diagnosis not present

## 2019-02-15 DIAGNOSIS — E668 Other obesity: Secondary | ICD-10-CM | POA: Diagnosis not present

## 2019-02-15 DIAGNOSIS — R0683 Snoring: Secondary | ICD-10-CM | POA: Diagnosis not present

## 2019-03-18 DIAGNOSIS — R0683 Snoring: Secondary | ICD-10-CM | POA: Diagnosis not present

## 2019-03-18 DIAGNOSIS — G4719 Other hypersomnia: Secondary | ICD-10-CM | POA: Diagnosis not present

## 2019-03-18 DIAGNOSIS — E668 Other obesity: Secondary | ICD-10-CM | POA: Diagnosis not present

## 2019-03-18 DIAGNOSIS — G4733 Obstructive sleep apnea (adult) (pediatric): Secondary | ICD-10-CM | POA: Diagnosis not present

## 2019-04-17 DIAGNOSIS — G4733 Obstructive sleep apnea (adult) (pediatric): Secondary | ICD-10-CM | POA: Diagnosis not present

## 2019-04-17 DIAGNOSIS — G4719 Other hypersomnia: Secondary | ICD-10-CM | POA: Diagnosis not present

## 2019-04-17 DIAGNOSIS — R0683 Snoring: Secondary | ICD-10-CM | POA: Diagnosis not present

## 2019-04-17 DIAGNOSIS — E668 Other obesity: Secondary | ICD-10-CM | POA: Diagnosis not present

## 2019-05-18 DIAGNOSIS — R0683 Snoring: Secondary | ICD-10-CM | POA: Diagnosis not present

## 2019-05-18 DIAGNOSIS — G4719 Other hypersomnia: Secondary | ICD-10-CM | POA: Diagnosis not present

## 2019-05-18 DIAGNOSIS — E668 Other obesity: Secondary | ICD-10-CM | POA: Diagnosis not present

## 2019-05-18 DIAGNOSIS — G4733 Obstructive sleep apnea (adult) (pediatric): Secondary | ICD-10-CM | POA: Diagnosis not present

## 2019-05-22 ENCOUNTER — Telehealth: Payer: Self-pay

## 2019-05-22 NOTE — Telephone Encounter (Signed)
Called patient and pt's daughter and informed them of upcoming appointments for pt and then made appt's for his wife Kourtney Montesinos.

## 2019-05-22 NOTE — Telephone Encounter (Signed)
PLEASE NOTE: All timestamps contained within this report are represented as [place] Standard Time. CONFIDENTIALTY NOTICE: This fax transmission is intended only for the addressee. It contains information that is legally privileged, confidential or otherwise protected from use or disclosure. If you are not the intended recipient, you are strictly prohibited from reviewing, disclosing, copying using or disseminating any of this information or taking any action in reliance on or regarding this information. If you have received this fax in error, please notify us immediately by telephone so that we can arrange for its return to [place]. Phone: [phone], Toll-Free: [phone], Fax: [phone] Page: 1 of 1 Call Id: [id number] [facility] - Client Nonclinical Telephone Record AccessNurse Client [place] [facility] - Client Client Site [facility] Physician Alma Friendly - NP Contact Type Call Who Is Calling Patient / Member / Family / Caregiver Caller Name Bennett Phone Number [phone] Patient Name Kevin Campbell Patient DOB 03-Jul-1946 Call Type Message Only Information Provided Reason for Call Request for General Office Information Initial Comment Caller states her father received a call today regarding an appt Thursday at [facility] and needs to know if it is for her father or mother. Additional Comment Caller states the appt could also be for pt [name] (DOB 05/02/1944). Provided office hours. Call Closed By: Dory Larsen Transaction Date/Time: 05/21/2019 5:08:46 PM (ET)

## 2019-05-23 ENCOUNTER — Other Ambulatory Visit: Payer: Self-pay | Admitting: Family Medicine

## 2019-05-23 DIAGNOSIS — E785 Hyperlipidemia, unspecified: Secondary | ICD-10-CM

## 2019-05-23 DIAGNOSIS — E119 Type 2 diabetes mellitus without complications: Secondary | ICD-10-CM

## 2019-05-23 DIAGNOSIS — C61 Malignant neoplasm of prostate: Secondary | ICD-10-CM

## 2019-05-23 DIAGNOSIS — N401 Enlarged prostate with lower urinary tract symptoms: Secondary | ICD-10-CM

## 2019-05-23 DIAGNOSIS — N183 Chronic kidney disease, stage 3 unspecified: Secondary | ICD-10-CM

## 2019-05-23 DIAGNOSIS — I10 Essential (primary) hypertension: Secondary | ICD-10-CM

## 2019-05-24 ENCOUNTER — Other Ambulatory Visit: Payer: Self-pay

## 2019-05-24 ENCOUNTER — Other Ambulatory Visit (INDEPENDENT_AMBULATORY_CARE_PROVIDER_SITE_OTHER): Payer: PPO

## 2019-05-24 DIAGNOSIS — E785 Hyperlipidemia, unspecified: Secondary | ICD-10-CM | POA: Diagnosis not present

## 2019-05-24 DIAGNOSIS — C61 Malignant neoplasm of prostate: Secondary | ICD-10-CM | POA: Diagnosis not present

## 2019-05-24 DIAGNOSIS — I10 Essential (primary) hypertension: Secondary | ICD-10-CM | POA: Diagnosis not present

## 2019-05-24 DIAGNOSIS — N183 Chronic kidney disease, stage 3 unspecified: Secondary | ICD-10-CM

## 2019-05-24 DIAGNOSIS — E119 Type 2 diabetes mellitus without complications: Secondary | ICD-10-CM

## 2019-05-24 LAB — COMPREHENSIVE METABOLIC PANEL
ALT: 17 U/L (ref 0–53)
AST: 17 U/L (ref 0–37)
Albumin: 4.1 g/dL (ref 3.5–5.2)
Alkaline Phosphatase: 63 U/L (ref 39–117)
BUN: 19 mg/dL (ref 6–23)
CO2: 27 mEq/L (ref 19–32)
Calcium: 9.5 mg/dL (ref 8.4–10.5)
Chloride: 105 mEq/L (ref 96–112)
Creatinine, Ser: 1.35 mg/dL (ref 0.40–1.50)
GFR: 52.14 mL/min — ABNORMAL LOW (ref >60.00)
Glucose, Bld: 107 mg/dL — ABNORMAL HIGH (ref 70–99)
Potassium: 4.6 mEq/L (ref 3.5–5.1)
Sodium: 140 mEq/L (ref 135–145)
Total Bilirubin: 0.9 mg/dL (ref 0.2–1.2)
Total Protein: 6.7 g/dL (ref 6.0–8.3)

## 2019-05-24 LAB — CBC WITH DIFFERENTIAL/PLATELET
Basophils Absolute: 0 10*3/uL (ref 0.0–0.1)
Basophils Relative: 0.5 % (ref 0.0–3.0)
Eosinophils Absolute: 0.3 10*3/uL (ref 0.0–0.7)
Eosinophils Relative: 3.8 % (ref 0.0–5.0)
HCT: 45.3 % (ref 39.0–52.0)
Hemoglobin: 15.7 g/dL (ref 13.0–17.0)
Lymphocytes Relative: 34.3 % (ref 12.0–46.0)
Lymphs Abs: 2.7 10*3/uL (ref 0.7–4.0)
MCHC: 34.6 g/dL (ref 30.0–36.0)
MCV: 87.7 fl (ref 78.0–100.0)
Monocytes Absolute: 0.9 10*3/uL (ref 0.1–1.0)
Monocytes Relative: 11.2 % (ref 3.0–12.0)
Neutro Abs: 3.9 10*3/uL (ref 1.4–7.7)
Neutrophils Relative %: 50.2 % (ref 43.0–77.0)
Platelets: 206 10*3/uL (ref 150.0–400.0)
RBC: 5.17 Mil/uL (ref 4.22–5.81)
RDW: 13.6 % (ref 11.5–15.5)
WBC: 7.8 10*3/uL (ref 4.0–10.5)

## 2019-05-24 LAB — PSA: PSA: 0 ng/mL — ABNORMAL LOW (ref 0.10–4.00)

## 2019-05-24 LAB — LIPID PANEL
Cholesterol: 171 mg/dL (ref 0–200)
HDL: 26.7 mg/dL — ABNORMAL LOW (ref >39.00)
LDL Cholesterol: 111 mg/dL — ABNORMAL HIGH (ref 0–99)
NonHDL: 144.79
Total CHOL/HDL Ratio: 6
Triglycerides: 168 mg/dL — ABNORMAL HIGH (ref 0.0–149.0)
VLDL: 33.6 mg/dL (ref 0.0–40.0)

## 2019-05-24 LAB — HEMOGLOBIN A1C: Hgb A1c MFr Bld: 6.2 % (ref 4.6–6.5)

## 2019-05-24 LAB — MICROALBUMIN / CREATININE URINE RATIO
Creatinine,U: 139.4 mg/dL
Microalb Creat Ratio: 0.5 mg/g (ref 0.0–30.0)
Microalb, Ur: <0.7 mg/dL (ref 0.0–1.9)

## 2019-05-30 ENCOUNTER — Other Ambulatory Visit: Payer: Self-pay

## 2019-05-30 ENCOUNTER — Ambulatory Visit: Payer: Self-pay

## 2019-05-30 ENCOUNTER — Ambulatory Visit (INDEPENDENT_AMBULATORY_CARE_PROVIDER_SITE_OTHER): Payer: PPO | Admitting: Family Medicine

## 2019-05-30 ENCOUNTER — Encounter: Payer: Self-pay | Admitting: Family Medicine

## 2019-05-30 VITALS — BP 134/78 | HR 62 | Temp 98.4°F | Ht 66.5 in | Wt 234.0 lb

## 2019-05-30 DIAGNOSIS — R1310 Dysphagia, unspecified: Secondary | ICD-10-CM | POA: Diagnosis not present

## 2019-05-30 DIAGNOSIS — Z23 Encounter for immunization: Secondary | ICD-10-CM | POA: Diagnosis not present

## 2019-05-30 DIAGNOSIS — R1319 Other dysphagia: Secondary | ICD-10-CM

## 2019-05-30 DIAGNOSIS — K219 Gastro-esophageal reflux disease without esophagitis: Secondary | ICD-10-CM

## 2019-05-30 DIAGNOSIS — C61 Malignant neoplasm of prostate: Secondary | ICD-10-CM

## 2019-05-30 DIAGNOSIS — Z6379 Other stressful life events affecting family and household: Secondary | ICD-10-CM

## 2019-05-30 DIAGNOSIS — E785 Hyperlipidemia, unspecified: Secondary | ICD-10-CM

## 2019-05-30 DIAGNOSIS — I10 Essential (primary) hypertension: Secondary | ICD-10-CM

## 2019-05-30 DIAGNOSIS — Z7189 Other specified counseling: Secondary | ICD-10-CM

## 2019-05-30 DIAGNOSIS — N3281 Overactive bladder: Secondary | ICD-10-CM

## 2019-05-30 DIAGNOSIS — Z0000 Encounter for general adult medical examination without abnormal findings: Secondary | ICD-10-CM

## 2019-05-30 DIAGNOSIS — N401 Enlarged prostate with lower urinary tract symptoms: Secondary | ICD-10-CM

## 2019-05-30 DIAGNOSIS — N183 Chronic kidney disease, stage 3 unspecified: Secondary | ICD-10-CM

## 2019-05-30 DIAGNOSIS — E119 Type 2 diabetes mellitus without complications: Secondary | ICD-10-CM

## 2019-05-30 MED ORDER — AMLODIPINE BESYLATE 5 MG PO TABS: 5.0000 mg | ORAL_TABLET | Freq: Every day | ORAL | 3 refills | Status: DC

## 2019-05-30 MED ORDER — ATORVASTATIN CALCIUM 20 MG PO TABS: 20.0000 mg | ORAL_TABLET | Freq: Every day | ORAL | 3 refills | Status: DC

## 2019-05-30 MED ORDER — OMEPRAZOLE 20 MG PO CPDR: 20.0000 mg | DELAYED_RELEASE_CAPSULE | Freq: Every day | ORAL | 3 refills | Status: DC

## 2019-05-30 NOTE — Progress Notes (Signed)
This visit was conducted in person.  BP 134/78 (BP Location: Left Arm, Patient Position: Sitting, Cuff Size: Large)   Pulse 62   Temp 98.4 F (36.9 C) (Temporal)   Ht 5' 6.5" (1.689 m)   Wt 234 lb (106.1 kg)   SpO2 97%   BMI 37.20 kg/m    CC: CPE/AMW Subjective:    Patient ID: Kevin Campbell, male    DOB: 03-20-1946, 73 y.o.   MRN: [id number]  HPI: Kevin Campbell is a 73 y.o. male presenting on 05/30/2019 for Medicare Wellness   Did not see health coach this year.  Cares for wife with dementia. Struggles with this. Has care aide he has hired. Daughter lives nearby.  Lost job due to pandemic.  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   Notes intermittent dysphagia to solids a couple times a week. No early satiety. + GERD managed with glass of milk - worse with spicy foods. 10 lb weight loss in the last year - cutting back on soft drinks.    Hearing Screening   125Hz  250Hz  500Hz  1000Hz  2000Hz  3000Hz  4000Hz  6000Hz  8000Hz   Right ear:   20 20 20   0    Left ear:   25 20 40  0    Vision Screening Comments: Last eye exam 05/2018.  Next exam, 06/04/19.    Office Visit from 05/30/2019 in [facility] at [facility]  PHQ-2 Total Score  0      Fall Risk  05/30/2019 05/22/2018 05/11/2017 05/10/2017  Falls in the past year? 0 Yes No No  Comment - accidental fall off lawn mower; injury to right rib area - -  Number falls in past yr: - 1 - -  Injury with Fall? - Yes - -     Preventative: Colonoscopy 2011 - 1 HP, diverticulosis Vira Agar) H/o prostate cancer2013s/p radioactive seed implant(MacDiarmid) sees urology yearly. Lung cancer screening - not eligible Flu shot yearly Pneumovax 2013, prevnar 2019, pneumovax today Td 2008  Shingrix - discussed  Advanced directive - working on this - needs to get notarized. HCPOA would be daughter [name]. Asked to bring [place] a copy when completed.  Seat belt use discussed.  Sunscreen use  discussed. No changing moles on skin.  Ex smoker - quit 1992 Alcohol - none Dentist - has not seen recently - has upper plate Eye exam - yearly, watching cataracts  Bowel - no constipation Bladder - h/o incontinence completed PTNS without benefit. Nocturia x3-4 at night  Caffeine: 2 cups coffee occasionally, soda throughout the day Lives with wife, no pets. 2 grown children Occupation: truck driver Edu: HS Activity: sedentary, some [place], good fruits and vegetables     Relevant past medical, surgical, family and social history reviewed and updated as indicated. Interim medical history since our last visit reviewed. Allergies and medications reviewed and updated. Outpatient Medications Prior to Visit  Medication Sig Dispense Refill  . aspirin EC 81 MG tablet Take 81 mg by mouth daily.    [name] amLODipine (NORVASC) 5 MG tablet TAKE 1 TABLET BY MOUTH ONCE DAILY 90 tablet 3  . tamsulosin (FLOMAX) 0.4 MG CAPS capsule Take 1 capsule (0.4 mg total) by mouth daily. Take in the mornings 30 capsule 3   No facility-administered medications prior to visit.      Per HPI unless specifically indicated in ROS section below Review of Systems  Constitutional: Negative for activity change, appetite change, chills,  fatigue, fever and unexpected weight change.  HENT: Positive for congestion. Negative for hearing loss.   Eyes: Negative for visual disturbance.  Respiratory: Positive for cough (allergy related). Negative for chest tightness, shortness of breath and wheezing.   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  Gastrointestinal: Negative for abdominal distention, abdominal pain, blood in stool, constipation, diarrhea, nausea and vomiting.  Genitourinary: Negative for difficulty urinating and hematuria.  Musculoskeletal: Negative for arthralgias, myalgias and neck pain.  Skin: Negative for rash.   Neurological: Negative for dizziness, seizures, syncope and headaches.  Hematological: Negative for adenopathy. Does not bruise/bleed easily.  Psychiatric/Behavioral: Positive for dysphoric mood (caregiver stress). The patient is not nervous/anxious.    Objective:    BP 134/78 (BP Location: Left Arm, Patient Position: Sitting, Cuff Size: Large)   Pulse 62   Temp 98.4 F (36.9 C) (Temporal)   Ht 5' 6.5" (1.689 m)   Wt 234 lb (106.1 kg)   SpO2 97%   BMI 37.20 kg/m   Wt Readings from Last 3 Encounters:  05/30/19 234 lb (106.1 kg)  05/29/18 244 lb 12 oz (111 kg)  05/22/18 244 lb 8 oz (110.9 kg)    Physical Exam Vitals signs and nursing note reviewed.  Constitutional:      General: He is not in acute distress.    Appearance: Normal appearance. He is well-developed. He is not ill-appearing.  HENT:     Head: Normocephalic and atraumatic.     Right Ear: Hearing, tympanic membrane, ear canal and external ear normal.     Left Ear: Hearing, tympanic membrane, ear canal and external ear normal.     Nose: Nose normal.     Mouth/Throat:     Mouth: Mucous membranes are moist.     Pharynx: Uvula midline. No oropharyngeal exudate or posterior oropharyngeal erythema.  Eyes:     General: No scleral icterus.    Extraocular Movements: Extraocular movements intact.     Conjunctiva/sclera: Conjunctivae normal.     Pupils: Pupils are equal, round, and reactive to light.  Neck:     Musculoskeletal: Normal range of motion and neck supple.     Vascular: No carotid bruit.  Cardiovascular:     Rate and Rhythm: Normal rate and regular rhythm.     Pulses: Normal pulses.          Radial pulses are 2+ on the right side and 2+ on the left side.     Heart sounds: Normal heart sounds. No murmur.  Pulmonary:     Effort: Pulmonary effort is normal. No respiratory distress.     Breath sounds: Normal breath sounds. No wheezing, rhonchi or rales.  Abdominal:     General: Abdomen is flat. Bowel sounds are  normal. There is no distension.     Palpations: Abdomen is soft. There is no mass.     Tenderness: There is no abdominal tenderness. There is no guarding or rebound.     Hernia: No hernia is present.  Musculoskeletal: Normal range of motion.     Right lower leg: No edema.     Left lower leg: No edema.  Lymphadenopathy:     Cervical: No cervical adenopathy.  Skin:    General: Skin is warm and dry.     Findings: No rash.  Neurological:     General: No focal deficit present.     Mental Status: He is alert and oriented to person, place, and time.  Comments:  CN grossly intact, station and gait intact Recall 2/3, 2/3 with cue Calculation 5/5 [id number]  Psychiatric:        Mood and Affect: Mood normal.        Behavior: Behavior normal.        Thought Content: Thought content normal.        Judgment: Judgment normal.       Results for orders placed or performed in visit on 05/24/19  CBC with Differential/Platelet  Result Value Ref Range   WBC 7.8 4.0 - 10.5 K/uL   RBC 5.17 4.22 - 5.81 Mil/uL   Hemoglobin 15.7 13.0 - 17.0 g/dL   HCT 45.3 39.0 - 52.0 %   MCV 87.7 78.0 - 100.0 fl   MCHC 34.6 30.0 - 36.0 g/dL   RDW 13.6 11.5 - 15.5 %   Platelets 206.0 150.0 - 400.0 K/uL   Neutrophils Relative % 50.2 43.0 - 77.0 %   Lymphocytes Relative 34.3 12.0 - 46.0 %   Monocytes Relative 11.2 3.0 - 12.0 %   Eosinophils Relative 3.8 0.0 - 5.0 %   Basophils Relative 0.5 0.0 - 3.0 %   Neutro Abs 3.9 1.4 - 7.7 K/uL   Lymphs Abs 2.7 0.7 - 4.0 K/uL   Monocytes Absolute 0.9 0.1 - 1.0 K/uL   Eosinophils Absolute 0.3 0.0 - 0.7 K/uL   Basophils Absolute 0.0 0.0 - 0.1 K/uL  Microalbumin / creatinine urine ratio  Result Value Ref Range   Microalb, Ur <0.7 0.0 - 1.9 mg/dL   Creatinine,U 139.4 mg/dL   Microalb Creat Ratio 0.5 0.0 - 30.0 mg/g  PSA  Result Value Ref Range   PSA 0.00 Repeated and verified X2. (L) 0.10 - 4.00 ng/mL  Hemoglobin A1c  Result Value Ref Range   Hgb A1c MFr Bld 6.2 4.6  - 6.5 %  Comprehensive metabolic panel  Result Value Ref Range   Sodium 140 135 - 145 mEq/L   Potassium 4.6 3.5 - 5.1 mEq/L   Chloride 105 96 - 112 mEq/L   CO2 27 19 - 32 mEq/L   Glucose, Bld 107 (H) 70 - 99 mg/dL   BUN 19 6 - 23 mg/dL   Creatinine, Ser 1.35 0.40 - 1.50 mg/dL   Total Bilirubin 0.9 0.2 - 1.2 mg/dL   Alkaline Phosphatase 63 39 - 117 U/L   AST 17 0 - 37 U/L   ALT 17 0 - 53 U/L   Total Protein 6.7 6.0 - 8.3 g/dL   Albumin 4.1 3.5 - 5.2 g/dL   Calcium 9.5 8.4 - 10.5 mg/dL   GFR 52.14 (L) >60.00 mL/min  Lipid panel  Result Value Ref Range   Cholesterol 171 0 - 200 mg/dL   Triglycerides 168.0 (H) 0.0 - 149.0 mg/dL   HDL 26.70 (L) >39.00 mg/dL   VLDL 33.6 0.0 - 40.0 mg/dL   LDL Cholesterol 111 (H) 0 - 99 mg/dL   Total CHOL/HDL Ratio 6    NonHDL 144.79    Assessment & Plan:   Problem List Items Addressed This Visit    Stress due to illness of family member    Caregiver stress. Support provided.  Pt may be interested in mood medication.  Update PHQ9/GAD7 - overall lo scores.  Discussed importance of caregiver care - he declines medication at this time. Will continue to assess, let me know if feeling more overwhelmed.      Severe obesity (BMI 35.0-39.9) with comorbidity ([place])    Congratulated on  weight loss. Continue healthy diet changes.       Primary prostate adenocarcinoma ([place])    2013. S/p seed implants. PSA reassuring. Continues seeing urology yearly.       OAB (overactive bladder)   Medicare annual wellness visit, subsequent - Primary    I have personally reviewed the Medicare Annual Wellness questionnaire and have noted 1. The patient's medical and social history 2. Their use of alcohol, tobacco or illicit drugs 3. Their current medications and supplements 4. The patient's functional ability including ADL's, fall risks, home safety risks and hearing or visual impairment. Cognitive function has been assessed and addressed as indicated.  5. Diet and  physical activity 6. Evidence for depression or mood disorders The patients weight, height, BMI have been recorded in the chart. I have made referrals, counseling and provided education to the patient based on review of the above and I have provided the pt with a written personalized care plan for preventive services. Provider list updated.. See scanned questionairre as needed for further documentation. Reviewed preventative protocols and updated unless pt declined.       Healthcare maintenance    Preventative protocols reviewed and updated unless pt declined. Discussed healthy diet and lifestyle.       GERD    Diet controlled. Trial omeprazole 20mg  daily x 2 wks then PRN. Update with effect.       Relevant Medications   omeprazole (PRILOSEC) 20 MG capsule   Essential hypertension    Chronic, stable. Continue current regimen.       Relevant Medications   amLODipine (NORVASC) 5 MG tablet   atorvastatin (LIPITOR) 20 MG tablet   Dysphagia    Mild, no red flags, in setting of known GERD. rec omeprazole 20mg  daily x 2 wks and if ongoing to update me for GI referral.       Dyslipidemia    Chronic off statin. Discussed statin to lower cardiovascular risk, pt interested in trying low potency lipitor regimen. Reviewed decreased frequency if myalgias. The 10-year ASCVD risk score Mikey Bussing DC Brooke Bonito., et al., 2013) is: 45.5%   Values used to calculate the score:     Age: 35 years     Sex: Male     Is Non-Hispanic African American: No     Diabetic: Yes     Tobacco smoker: No     Systolic Blood Pressure: 734 mmHg     Is BP treated: Yes     HDL Cholesterol: 26.7 mg/dL     Total Cholesterol: 171 mg/dL       Relevant Medications   atorvastatin (LIPITOR) 20 MG tablet   Diet-controlled diabetes mellitus ([place])    Now in prediabetes range after 10 lb weight loss. Congratulated. Encouraged low carb/low sugar diet, ongoing efforts at weight loss.       Relevant Medications   atorvastatin  (LIPITOR) 20 MG tablet   CKD (chronic kidney disease) stage 3, GFR 30-59 ml/min (HCC)    Chronic, stable. Continue to monitor      BPH associated with nocturia    Tamsulosin was too expensive and not covered by insurance. Will f/u with uro.       Advanced care planning/counseling discussion    Advanced directive - working on this - needs to get notarized. HCPOA would be daughter [name]. Asked to bring [place] a copy when completed.        Other Visit Diagnoses    Need for 23-polyvalent pneumococcal polysaccharide vaccine  Relevant Orders   Pneumococcal polysaccharide vaccine 23-valent greater than or equal to 2yo subcutaneous/IM (Completed)       Meds ordered this encounter  Medications  . amLODipine (NORVASC) 5 MG tablet    Sig: Take 1 tablet (5 mg total) by mouth daily.    Dispense:  90 tablet    Refill:  3  . atorvastatin (LIPITOR) 20 MG tablet    Sig: Take 1 tablet (20 mg total) by mouth daily at 6 PM.    Dispense:  90 tablet    Refill:  3  . omeprazole (PRILOSEC) 20 MG capsule    Sig: Take 1 capsule (20 mg total) by mouth daily. For 2 weeks then as needed    Dispense:  30 capsule    Refill:  3   Orders Placed This Encounter  Procedures  . Pneumococcal polysaccharide vaccine 23-valent greater than or equal to 2yo subcutaneous/IM    Follow up plan: Return in about 1 year (around 05/29/2020) for annual exam, prior fasting for blood work, medicare wellness visit.  Ria Bush, MD

## 2019-05-30 NOTE — Assessment & Plan Note (Addendum)
Chronic off statin. Discussed statin to lower cardiovascular risk, pt interested in trying low potency lipitor regimen. Reviewed decreased frequency if myalgias. The 10-year ASCVD risk score Mikey Bussing DC Brooke Bonito., et al., 2013) is: 45.5%   Values used to calculate the score:     Age: 73 years     Sex: Male     Is Non-Hispanic African American: No     Diabetic: Yes     Tobacco smoker: No     Systolic Blood Pressure: 161 mmHg     Is BP treated: Yes     HDL Cholesterol: 26.7 mg/dL     Total Cholesterol: 171 mg/dL

## 2019-05-30 NOTE — Assessment & Plan Note (Addendum)
Now in prediabetes range after 10 lb weight loss. Congratulated. Encouraged low carb/low sugar diet, ongoing efforts at weight loss.

## 2019-05-30 NOTE — Assessment & Plan Note (Signed)
Diet controlled. Trial omeprazole 20mg  daily x 2 wks then PRN. Update with effect.

## 2019-05-30 NOTE — Assessment & Plan Note (Signed)
Congratulated on weight loss. Continue healthy diet changes.

## 2019-05-30 NOTE — Assessment & Plan Note (Addendum)
Caregiver stress. Support provided.  Pt may be interested in mood medication.  Update PHQ9/GAD7 - overall lo scores.  Discussed importance of caregiver care - he declines medication at this time. Will continue to assess, let me know if feeling more overwhelmed.

## 2019-05-30 NOTE — Assessment & Plan Note (Addendum)
Advanced directive - working on this - needs to get notarized. HCPOA would be daughter Kevin Campbell.Asked to bring us a copy when completed.  

## 2019-05-30 NOTE — Patient Instructions (Addendum)
Pneumovax today (final pneumonia shot).  Try omeprazole 20mg  daily for 2 weeks then as needed, sent to pharmacy. If ongoing trouble with swallowing, let me know for referral to GI.  If interested, check with pharmacy about new 2 shot shingles series (shingrix).  Bring [place] a copy of your living will when completed.  Try lipitor for cholesterol - 20mg  daily. If muscle aches with this, back down to a few times a week.  You are doing well today. Return as needed or in 1 year for next physical.   Health Maintenance After Age 17 After age 22, you are at a higher risk for certain long-term diseases and infections as well as injuries from falls. Falls are a major cause of broken bones and head injuries in people who are older than age 40. Getting regular preventive care can help to keep you healthy and well. Preventive care includes getting regular testing and making lifestyle changes as recommended by your health care provider. Talk with your health care provider about:  Which screenings and tests you should have. A screening is a test that checks for a disease when you have no symptoms.  A diet and exercise plan that is right for you. What should I know about screenings and tests to prevent falls? Screening and testing are the best ways to find a health problem early. Early diagnosis and treatment give you the best chance of managing medical conditions that are common after age 20. Certain conditions and lifestyle choices may make you more likely to have a fall. Your health care provider may recommend:  Regular vision checks. Poor vision and conditions such as cataracts can make you more likely to have a fall. If you wear glasses, make sure to get your prescription updated if your vision changes.  Medicine review. Work with your health care provider to regularly review all of the medicines you are taking, including over-the-counter medicines. Ask your health care provider about any side effects that may  make you more likely to have a fall. Tell your health care provider if any medicines that you take make you feel dizzy or sleepy.  Osteoporosis screening. Osteoporosis is a condition that causes the bones to get weaker. This can make the bones weak and cause them to break more easily.  Blood pressure screening. Blood pressure changes and medicines to control blood pressure can make you feel dizzy.  Strength and balance checks. Your health care provider may recommend certain tests to check your strength and balance while standing, walking, or changing positions.  Foot health exam. Foot pain and numbness, as well as not wearing proper footwear, can make you more likely to have a fall.  Depression screening. You may be more likely to have a fall if you have a fear of falling, feel emotionally low, or feel unable to do activities that you used to do.  Alcohol use screening. Using too much alcohol can affect your balance and may make you more likely to have a fall. What actions can I take to lower my risk of falls? General instructions  Talk with your health care provider about your risks for falling. Tell your health care provider if: ? You fall. Be sure to tell your health care provider about all falls, even ones that seem minor. ? You feel dizzy, sleepy, or off-balance.  Take over-the-counter and prescription medicines only as told by your health care provider. These include any supplements.  Eat a healthy diet and maintain a healthy weight.  A healthy diet includes low-fat dairy products, low-fat (lean) meats, and fiber from whole grains, beans, and lots of fruits and vegetables. Home safety  Remove any tripping hazards, such as rugs, cords, and clutter.  Install safety equipment such as grab bars in bathrooms and safety rails on stairs.  Keep rooms and walkways well-lit. Activity   Follow a regular exercise program to stay fit. This will help you maintain your balance. Ask your health  care provider what types of exercise are appropriate for you.  If you need a cane or walker, use it as recommended by your health care provider.  Wear supportive shoes that have nonskid soles. Lifestyle  Do not drink alcohol if your health care provider tells you not to drink.  If you drink alcohol, limit how much you have: ? 0-1 drink a day for women. ? 0-2 drinks a day for men.  Be aware of how much alcohol is in your drink. In the [place] one drink equals one typical bottle of beer (12 oz), one-half glass of wine (5 oz), or one shot of hard liquor (1 oz).  Do not use any products that contain nicotine or tobacco, such as cigarettes and e-cigarettes. If you need help quitting, ask your health care provider. Summary  Having a healthy lifestyle and getting preventive care can help to protect your health and wellness after age 44.  Screening and testing are the best way to find a health problem early and help you avoid having a fall. Early diagnosis and treatment give you the best chance for managing medical conditions that are more common for people who are older than age 30.  Falls are a major cause of broken bones and head injuries in people who are older than age 32. Take precautions to prevent a fall at home.  Work with your health care provider to learn what changes you can make to improve your health and wellness and to prevent falls. This information is not intended to replace advice given to you by your health care provider. Make sure you discuss any questions you have with your health care provider. Document Released: 08/10/2017 Document Revised: 01/18/2019 Document Reviewed: 08/10/2017 Elsevier Patient Education  2020 [employer].

## 2019-05-30 NOTE — Assessment & Plan Note (Signed)
2013. S/p seed implants. PSA reassuring. Continues seeing urology yearly.

## 2019-05-30 NOTE — Assessment & Plan Note (Signed)
Tamsulosin was too expensive and not covered by insurance. Will f/u with uro.

## 2019-05-30 NOTE — Assessment & Plan Note (Signed)
Chronic, stable. Continue to monitor.  

## 2019-05-30 NOTE — Assessment & Plan Note (Signed)
Chronic, stable. Continue current regimen. 

## 2019-05-30 NOTE — Assessment & Plan Note (Signed)

## 2019-05-30 NOTE — Assessment & Plan Note (Signed)
Preventative protocols reviewed and updated unless pt declined. Discussed healthy diet and lifestyle.  

## 2019-05-30 NOTE — Assessment & Plan Note (Signed)
Mild, no red flags, in setting of known GERD. rec omeprazole 20mg  daily x 2 wks and if ongoing to update me for GI referral.

## 2019-06-04 DIAGNOSIS — H524 Presbyopia: Secondary | ICD-10-CM | POA: Diagnosis not present

## 2019-06-04 DIAGNOSIS — H2513 Age-related nuclear cataract, bilateral: Secondary | ICD-10-CM | POA: Diagnosis not present

## 2019-06-04 DIAGNOSIS — H52223 Regular astigmatism, bilateral: Secondary | ICD-10-CM | POA: Diagnosis not present

## 2019-06-04 DIAGNOSIS — H25043 Posterior subcapsular polar age-related cataract, bilateral: Secondary | ICD-10-CM | POA: Diagnosis not present

## 2019-06-26 DIAGNOSIS — H25013 Cortical age-related cataract, bilateral: Secondary | ICD-10-CM | POA: Diagnosis not present

## 2019-06-26 DIAGNOSIS — H2513 Age-related nuclear cataract, bilateral: Secondary | ICD-10-CM | POA: Diagnosis not present

## 2019-06-26 DIAGNOSIS — H25043 Posterior subcapsular polar age-related cataract, bilateral: Secondary | ICD-10-CM | POA: Diagnosis not present

## 2019-06-26 DIAGNOSIS — H18413 Arcus senilis, bilateral: Secondary | ICD-10-CM | POA: Diagnosis not present

## 2019-06-26 DIAGNOSIS — H2511 Age-related nuclear cataract, right eye: Secondary | ICD-10-CM | POA: Diagnosis not present

## 2019-07-23 DIAGNOSIS — H2511 Age-related nuclear cataract, right eye: Secondary | ICD-10-CM | POA: Diagnosis not present

## 2019-07-24 DIAGNOSIS — H2512 Age-related nuclear cataract, left eye: Secondary | ICD-10-CM | POA: Diagnosis not present

## 2019-08-13 DIAGNOSIS — H2512 Age-related nuclear cataract, left eye: Secondary | ICD-10-CM | POA: Diagnosis not present

## 2020-03-21 DIAGNOSIS — Z961 Presence of intraocular lens: Secondary | ICD-10-CM | POA: Diagnosis not present

## 2020-03-21 DIAGNOSIS — H02831 Dermatochalasis of right upper eyelid: Secondary | ICD-10-CM | POA: Diagnosis not present

## 2020-03-21 DIAGNOSIS — H02834 Dermatochalasis of left upper eyelid: Secondary | ICD-10-CM | POA: Diagnosis not present

## 2020-03-21 DIAGNOSIS — H18413 Arcus senilis, bilateral: Secondary | ICD-10-CM | POA: Diagnosis not present

## 2020-06-02 ENCOUNTER — Ambulatory Visit: Payer: PPO

## 2020-06-03 ENCOUNTER — Ambulatory Visit (INDEPENDENT_AMBULATORY_CARE_PROVIDER_SITE_OTHER): Payer: PPO

## 2020-06-03 ENCOUNTER — Other Ambulatory Visit: Payer: Self-pay

## 2020-06-03 DIAGNOSIS — Z0000 Encounter for general adult medical examination without abnormal findings: Secondary | ICD-10-CM

## 2020-06-03 NOTE — Patient Instructions (Signed)
Kevin Campbell , Thank you for taking time to come for your Medicare Wellness Visit. I appreciate your ongoing commitment to your health goals. Please review the following plan we discussed and let me know if I can assist you in the future.   Screening recommendations/referrals: Colonoscopy: declined Recommended yearly ophthalmology/optometry visit for glaucoma screening and checkup Recommended yearly dental visit for hygiene and checkup  Vaccinations: Influenza vaccine: due, will get this in September or August Pneumococcal vaccine: Completed series Tdap vaccine: decline- insurance/financial Shingles vaccine: due, check with your insurance regarding coverage   Covid-19: declined  Advanced directives: Advance directive discussed with you today. I have provided a copy for you to complete at home and have notarized. Once this is complete please bring a copy in to our office so we can scan it into your chart.  Conditions/risks identified: diabetes, hypertension, hyperlipidemia  Next appointment: Follow up in one year for your annual wellness visit.   Preventive Care 39 Years and Older, Male Preventive care refers to lifestyle choices and visits with your health care provider that can promote health and wellness. What does preventive care include?  A yearly physical exam. This is also called an annual well check.  Dental exams once or twice a year.  Routine eye exams. Ask your health care provider how often you should have your eyes checked.  Personal lifestyle choices, including:  Daily care of your teeth and gums.  Regular physical activity.  Eating a healthy diet.  Avoiding tobacco and drug use.  Limiting alcohol use.  Practicing safe sex.  Taking low doses of aspirin every day.  Taking vitamin and mineral supplements as recommended by your health care provider. What happens during an annual well check? The services and screenings done by your health care provider during  your annual well check will depend on your age, overall health, lifestyle risk factors, and family history of disease. Counseling  Your health care provider may ask you questions about your:  Alcohol use.  Tobacco use.  Drug use.  Emotional well-being.  Home and relationship well-being.  Sexual activity.  Eating habits.  History of falls.  Memory and ability to understand (cognition).  Work and work Statistician. Screening  You may have the following tests or measurements:  Height, weight, and BMI.  Blood pressure.  Lipid and cholesterol levels. These may be checked every 5 years, or more frequently if you are over 41 years old.  Skin check.  Lung cancer screening. You may have this screening every year starting at age 6 if you have a 30-pack-year history of smoking and currently smoke or have quit within the past 15 years.  Fecal occult blood test (FOBT) of the stool. You may have this test every year starting at age 38.  Flexible sigmoidoscopy or colonoscopy. You may have a sigmoidoscopy every 5 years or a colonoscopy every 10 years starting at age 4.  Prostate cancer screening. Recommendations will vary depending on your family history and other risks.  Hepatitis C blood test.  Hepatitis B blood test.  Sexually transmitted disease (STD) testing.  Diabetes screening. This is done by checking your blood sugar (glucose) after you have not eaten for a while (fasting). You may have this done every 1-3 years.  Abdominal aortic aneurysm (AAA) screening. You may need this if you are a current or former smoker.  Osteoporosis. You may be screened starting at age 57 if you are at high risk. Talk with your health care provider about your  test results, treatment options, and if necessary, the need for more tests. Vaccines  Your health care provider may recommend certain vaccines, such as:  Influenza vaccine. This is recommended every year.  Tetanus, diphtheria, and  acellular pertussis (Tdap, Td) vaccine. You may need a Td booster every 10 years.  Zoster vaccine. You may need this after age 28.  Pneumococcal 13-valent conjugate (PCV13) vaccine. One dose is recommended after age 46.  Pneumococcal polysaccharide (PPSV23) vaccine. One dose is recommended after age 54. Talk to your health care provider about which screenings and vaccines you need and how often you need them. This information is not intended to replace advice given to you by your health care provider. Make sure you discuss any questions you have with your health care provider. Document Released: 10/24/2015 Document Revised: 06/16/2016 Document Reviewed: 07/29/2015 Elsevier Interactive Patient Education  2017 [place] Prevention in the Home Falls can cause injuries. They can happen to people of all ages. There are many things you can do to make your home safe and to help prevent falls. What can I do on the outside of my home?  Regularly fix the edges of walkways and driveways and fix any cracks.  Remove anything that might make you trip as you walk through a door, such as a raised step or threshold.  Trim any bushes or trees on the path to your home.  Use bright outdoor lighting.  Clear any walking paths of anything that might make someone trip, such as rocks or tools.  Regularly check to see if handrails are loose or broken. Make sure that both sides of any steps have handrails.  Any raised decks and porches should have guardrails on the edges.  Have any leaves, snow, or ice cleared regularly.  Use sand or salt on walking paths during winter.  Clean up any spills in your garage right away. This includes oil or grease spills. What can I do in the bathroom?  Use night lights.  Install grab bars by the toilet and in the tub and shower. Do not use towel bars as grab bars.  Use non-skid mats or decals in the tub or shower.  If you need to sit down in the shower, use  a plastic, non-slip stool.  Keep the floor dry. Clean up any water that spills on the floor as soon as it happens.  Remove soap buildup in the tub or shower regularly.  Attach bath mats securely with double-sided non-slip rug tape.  Do not have throw rugs and other things on the floor that can make you trip. What can I do in the bedroom?  Use night lights.  Make sure that you have a light by your bed that is easy to reach.  Do not use any sheets or blankets that are too big for your bed. They should not hang down onto the floor.  Have a firm chair that has side arms. You can use this for support while you get dressed.  Do not have throw rugs and other things on the floor that can make you trip. What can I do in the kitchen?  Clean up any spills right away.  Avoid walking on wet floors.  Keep items that you use a lot in easy-to-reach places.  If you need to reach something above you, use a strong step stool that has a grab bar.  Keep electrical cords out of the way.  Do not use floor polish or wax that  makes floors slippery. If you must use wax, use non-skid floor wax.  Do not have throw rugs and other things on the floor that can make you trip. What can I do with my stairs?  Do not leave any items on the stairs.  Make sure that there are handrails on both sides of the stairs and use them. Fix handrails that are broken or loose. Make sure that handrails are as long as the stairways.  Check any carpeting to make sure that it is firmly attached to the stairs. Fix any carpet that is loose or worn.  Avoid having throw rugs at the top or bottom of the stairs. If you do have throw rugs, attach them to the floor with carpet tape.  Make sure that you have a light switch at the top of the stairs and the bottom of the stairs. If you do not have them, ask someone to add them for you. What else can I do to help prevent falls?  Wear shoes that:  Do not have high heels.  Have  rubber bottoms.  Are comfortable and fit you well.  Are closed at the toe. Do not wear sandals.  If you use a stepladder:  Make sure that it is fully opened. Do not climb a closed stepladder.  Make sure that both sides of the stepladder are locked into place.  Ask someone to hold it for you, if possible.  Clearly mark and make sure that you can see:  Any grab bars or handrails.  First and last steps.  Where the edge of each step is.  Use tools that help you move around (mobility aids) if they are needed. These include:  Canes.  Walkers.  Scooters.  Crutches.  Turn on the lights when you go into a dark area. Replace any light bulbs as soon as they burn out.  Set up your furniture so you have a clear path. Avoid moving your furniture around.  If any of your floors are uneven, fix them.  If there are any pets around you, be aware of where they are.  Review your medicines with your doctor. Some medicines can make you feel dizzy. This can increase your chance of falling. Ask your doctor what other things that you can do to help prevent falls. This information is not intended to replace advice given to you by your health care provider. Make sure you discuss any questions you have with your health care provider. Document Released: 07/24/2009 Document Revised: 03/04/2016 Document Reviewed: 11/01/2014 Elsevier Interactive Patient Education  2017 [employer].

## 2020-06-03 NOTE — Progress Notes (Signed)
Subjective:   Kevin Campbell is a 74 y.o. male who presents for Medicare Annual/Subsequent preventive examination.  Review of Systems: N/A      I connected with the patient today by telephone and verified that I am speaking with the correct person using two identifiers. Location patient: home Location nurse: work Persons participating in the telephone visit: patient, nurse.   I discussed the limitations, risks, security and privacy concerns of performing an evaluation and management service by telephone and the availability of in person appointments. I also discussed with the patient that there may be a patient responsible charge related to this service. The patient expressed understanding and verbally consented to this telephonic visit.        Cardiac Risk Factors include: advanced age (>69men, >60 women);diabetes mellitus;hypertension;male gender;dyslipidemia     Objective:    Today's Vitals   06/03/20 1027  PainSc: 6    There is no height or weight on file to calculate BMI.  Advanced Directives 06/03/2020 05/22/2018 05/10/2017 02/16/2013 09/26/2012 09/20/2012  Does Patient Have a Medical Advance Directive? No No No Patient does not have advance directive Patient does not have advance directive;Patient would not like information Patient does not have advance directive  Would patient like information on creating a medical advance directive? Yes (MAU/Ambulatory/Procedural Areas - Information given) Yes (MAU/Ambulatory/Procedural Areas - Information given) Yes (MAU/Ambulatory/Procedural Areas - Information given) - - -  Pre-existing out of facility DNR order (yellow form or pink MOST form) - - - No No No    Current Medications (verified) Outpatient Encounter Medications as of 06/03/2020  Medication Sig  . amLODipine (NORVASC) 5 MG tablet Take 1 tablet (5 mg total) by mouth daily.  [name] aspirin EC 81 MG tablet Take 81 mg by mouth daily.  [name] omeprazole (PRILOSEC) 20 MG capsule Take 1  capsule (20 mg total) by mouth daily. For 2 weeks then as needed  . atorvastatin (LIPITOR) 20 MG tablet Take 1 tablet (20 mg total) by mouth daily at 6 PM. (Patient not taking: Reported on 06/03/2020)   No facility-administered encounter medications on file as of 06/03/2020.    Allergies (verified) Benadryl [diphenhydramine hcl], Celebrex [celecoxib], Codeine, Flomax [tamsulosin hcl], and Hydrocodone   History: Past Medical History:  Diagnosis Date  . BPH (benign prostatic hypertrophy)   . Carpal tunnel syndrome on both sides   . Cataract 05/22/2018   bilateral eyes  . Colon polyps 2011   [name]  . GERD (gastroesophageal reflux disease)    watches diet  . Nocturia   . Obstructive sleep apnea   . Overactive bladder    due to prostate seed implant radiation, failed myrbetriq, rapaflo, jalyn, now PTNS (McDiarmid)  . Prostate cancer ([place]) 08/24/12   Adenocarcinoma,gleason=3+4=7,& 3+3=6,PSA=5.75,volume=23cc(5/12)cores positive; treated with XRT and radioactive seed implant, doing well ([place])  . Rheumatoid arthritis ([place])    of hands by Xray but RF, CCP, CRP negative (12/2013)  . Seasonal allergies    Past Surgical History:  Procedure Laterality Date  . CARDIOVASCULAR STRESS TEST  11-26-2010   Normal lexiscan nuclear study with inferior thinning but no ischemia  . CLAVICLE SURGERY Left 1968   REMOVAL OF BONE SPUR FROM PREVIOUS FX YEARS AGO  . COLONOSCOPY  2011   1 polyp sigmoid (hyperplastic), diverticulosis, int hemorrhoids [facility] K[facility]her at [facility])  . KNEE ARTHROSCOPY Left JAN 2013  . KNEE ARTHROSCOPY Right 12/2017   Dr. [facility]  . PROSTATE BIOPSY  08/24/12   Adenocarcinoma  . RADIOACTIVE SEED  IMPLANT N/A 02/16/2013   Procedure: RADIOACTIVE PROSTATE SEED IMPLANT;  Surgeon: Claybon Jabs, MD  . SHOULDER ARTHROSCOPY WITH BICEPS TENDON REPAIR Right APRIL 2013  . TOTAL KNEE ARTHROPLASTY  09/26/2012   Procedure: TOTAL KNEE ARTHROPLASTY;  Surgeon: Sydnee Cabal, MD;   Location: [facility];  Service: Orthopedics;  Laterality: Left;   Family History  Problem Relation Age of Onset  . Coronary artery disease Mother 4       3vCABG  . Arthritis Mother   . Rheumatologic disease Mother   . Cancer Father        lung, smoker  . Cancer Sister        twin sister - breast  . Stroke Neg Hx   . Diabetes Neg Hx    Social History   Socioeconomic History  . Marital status: Married    Spouse name: Not on file  . Number of children: Not on file  . Years of education: Not on file  . Highest education level: Not on file  Occupational History  . Not on file  Tobacco Use  . Smoking status: Former Smoker    Years: 5.00    Types: Cigars    Quit date: 10/11/1990    Years since quitting: 29.6  . Smokeless tobacco: Never Used  . Tobacco comment: usually  chewed on cigars  Vaping Use  . Vaping Use: Never used  Substance and Sexual Activity  . Alcohol use: No  . Drug use: No  . Sexual activity: Not on file  Other Topics Concern  . Not on file  Social History Narrative   Caffeine: 2 cups coffee occasionally, soda throughout the day   Lives with wife, no pets.  2 grown children   Occupation: truck driver   Edu: HS   Activity: sedentary   Diet: rare water, good fruits and vegetables   Social Determinants of Health   Financial Resource Strain: Low Risk   . Difficulty of Paying Living Expenses: Not hard at all  Food Insecurity: No Food Insecurity  . Worried About Charity fundraiser in the Last Year: Never true  . Ran Out of Food in the Last Year: Never true  Transportation Needs: No Transportation Needs  . Lack of Transportation (Medical): No  . Lack of Transportation (Non-Medical): No  Physical Activity: Inactive  . Days of Exercise per Week: 0 days  . Minutes of Exercise per Session: 0 min  Stress: Stress Concern Present  . Feeling of Stress : To some extent  Social Connections:   . Frequency of Communication with Friends and Family: Not on file  .  Frequency of Social Gatherings with Friends and Family: Not on file  . Attends Religious Services: Not on file  . Active Member of Clubs or Organizations: Not on file  . Attends Archivist Meetings: Not on file  . Marital Status: Not on file    Tobacco Counseling Counseling given: Not Answered Comment: usually  chewed on cigars   Clinical Intake:  Pre-visit preparation completed: Yes  Pain : 0-10 Pain Score: 6  Pain Type: Chronic pain Pain Location: Back Pain Orientation: Lower Pain Descriptors / Indicators: Aching Pain Onset: More than a month ago Pain Frequency: Intermittent     Nutritional Risks: None Diabetes: Yes CBG done?: No Did pt. bring in CBG monitor from home?: No  How often do you need to have someone help you when you read instructions, pamphlets, or other written materials from your doctor or  pharmacy?: 1 - Never What is the last grade level you completed in school?: 12th  Diabetic: Yes  Nutrition Risk Assessment:  Has the patient had any N/V/D within the last 2 months?  No  Does the patient have any non-healing wounds?  No  Has the patient had any unintentional weight loss or weight gain?  No   Diabetes:  Is the patient diabetic?  Yes  If diabetic, was a CBG obtained today?  No  Did the patient bring in their glucometer from home?  No  How often do you monitor your CBG's? When needed.   Financial Strains and Diabetes Management:  Are you having any financial strains with the device, your supplies or your medication? No .  Does the patient want to be seen by Chronic Care Management for management of their diabetes?  No  Would the patient like to be referred to a Nutritionist or for Diabetic Management?  No   Diabetic Exams:  Diabetic Eye Exam: Completed 08/12/2019 Diabetic Foot Exam: Overdue, Pt has been advised about the importance in completing this exam. Pt is scheduled for diabetic foot exam on 06/04/2020.   Interpreter Needed?:  No  Information entered by :: CJohnson, LPN   Activities of Daily Living In your present state of health, do you have any difficulty performing the following activities: 06/03/2020  Hearing? N  Vision? N  Difficulty concentrating or making decisions? N  Walking or climbing stairs? N  Dressing or bathing? N  Doing errands, shopping? N  Preparing Food and eating ? N  Using the Toilet? N  In the past six months, have you accidently leaked urine? N  Do you have problems with loss of bowel control? N  Managing your Medications? N  Managing your Finances? N  Housekeeping or managing your Housekeeping? N  Some recent data might be hidden    Patient Care Team: Ria Bush, MD as PCP - General (Family Medicine)  Indicate any recent Medical Services you may have received from other than [facility] providers in the past year (date may be approximate).     Assessment:   This is a routine wellness examination for [place].  Hearing/Vision screen  Hearing Screening   125Hz  250Hz  500Hz  1000Hz  2000Hz  3000Hz  4000Hz  6000Hz  8000Hz   Right ear:           Left ear:           Vision Screening Comments: Patient gets annual eye exams  Dietary issues and exercise activities discussed: Current Exercise Habits: The patient does not participate in regular exercise at present, Exercise limited by: None identified  Goals    . Patient Stated     Starting 05/22/2018, I will continue to take medications as prescribed.     . Patient Stated     06/03/2020, I will maintain and continue medications as prescribed.       Depression Screen PHQ 2/9 Scores 06/03/2020 05/30/2019 05/30/2019 05/22/2018 05/10/2017  PHQ - 2 Score 0 0 0 0 0  PHQ- 9 Score 0 1 - 0 -    Fall Risk Fall Risk  06/03/2020 05/30/2019 05/22/2018 05/11/2017 05/10/2017  Falls in the past year? 0 0 Yes No No  Comment - - accidental fall off lawn mower; injury to right rib area - -  Number falls in past yr: 0 - 1 - -  Injury with Fall? 0 - Yes - -    Risk for fall due to : Medication side effect - - - -  Follow up Falls evaluation completed;Falls prevention discussed - - - -    Any stairs in or around the home? Yes  If so, are there any without handrails? No  Home free of loose throw rugs in walkways, pet beds, electrical cords, etc? Yes  Adequate lighting in your home to reduce risk of falls? Yes   ASSISTIVE DEVICES UTILIZED TO PREVENT FALLS:  Life alert? No  Use of a cane, walker or w/c? No  Grab bars in the bathroom? No  Shower chair or bench in shower? No  Elevated toilet seat or a handicapped toilet? No   TIMED UP AND GO:  Was the test performed? N/A, telephonic visit .    Cognitive Function: MMSE - Mini Mental State Exam 06/03/2020 05/22/2018 05/10/2017  Orientation to time 5 5 5   Orientation to Place 5 5 5   Registration 3 3 3   Attention/ Calculation 5 0 0  Recall 3 3 3   Language- name 2 objects - 0 0  Language- repeat 1 1 1   Language- follow 3 step command - 3 3  Language- read & follow direction - 0 0  Write a sentence - 0 0  Copy design - 0 0  Total score - 20 20  Mini Cog  Mini-Cog screen was completed. Maximum score is 22. A value of 0 denotes this part of the MMSE was not completed or the patient failed this part of the Mini-Cog screening.       Immunizations Immunization History  Administered Date(s) Administered  . Influenza Whole 06/22/2012  . Influenza, High Dose Seasonal PF 08/01/2017  . Influenza-Unspecified 07/08/2014, 07/12/2016  . Pneumococcal Conjugate-13 05/22/2018  . Pneumococcal Polysaccharide-23 10/18/2011, 05/30/2019  . Td 10/17/2006    TDAP status: Due, Education has been provided regarding the importance of this vaccine. Advised may receive this vaccine at local pharmacy or Health Dept. Aware to provide a copy of the vaccination record if obtained from local pharmacy or Health Dept. Verbalized acceptance and understanding. Flu Vaccine status: due, will get this in September or  October per patient Pneumococcal vaccine status: Up to date Covid-19 vaccine status: Declined, Education has been provided regarding the importance of this vaccine but patient still declined. Advised may receive this vaccine at local pharmacy or Health Dept.or vaccine clinic. Aware to provide a copy of the vaccination record if obtained from local pharmacy or Health Dept. Verbalized acceptance and understanding.  Qualifies for Shingles Vaccine? Yes   Zostavax completed No   Shingrix Completed?: No.    Education has been provided regarding the importance of this vaccine. Patient has been advised to call insurance company to determine out of pocket expense if they have not yet received this vaccine. Advised may also receive vaccine at local pharmacy or Health Dept. Verbalized acceptance and understanding.  Screening Tests Health Maintenance  Topic Date Due  . FOOT EXAM  Never done  . DTaP/Tdap/Td (2 - Tdap) 10/17/2016  . HEMOGLOBIN A1C  11/24/2019  . INFLUENZA VACCINE  05/11/2020  . URINE MICROALBUMIN  05/23/2020  . COVID-19 Vaccine (1) 06/19/2020 (Originally 08/30/1958)  . COLONOSCOPY  06/03/2021 (Originally 04/20/2020)  . DTAP VACCINES (1) 06/03/2024 (Originally 10/30/1946)  . TETANUS/TDAP  06/03/2024 (Originally 10/11/2018)  . OPHTHALMOLOGY EXAM  08/11/2020  . Hepatitis C Screening  Completed  . PNA vac Low Risk Adult  Completed    Health Maintenance  Health Maintenance Due  Topic Date Due  . FOOT EXAM  Never done  . DTaP/Tdap/Td (2 - Tdap) 10/17/2016  .  HEMOGLOBIN A1C  11/24/2019  . INFLUENZA VACCINE  05/11/2020  . URINE MICROALBUMIN  05/23/2020    Colorectal cancer screening: declined  Lung Cancer Screening: (Low Dose CT Chest recommended if Age 67-80 years, 30 pack-year currently smoking OR have quit w/in 15years.) does not qualify.    Additional Screening:  Hepatitis C Screening: does qualify; Completed 05/10/2017  Vision Screening: Recommended annual ophthalmology exams  for early detection of glaucoma and other disorders of the eye. Is the patient up to date with their annual eye exam?  Yes  Who is the provider or what is the name of the office in which the patient attends annual eye exams? [facility], Dr. Tommy Rainwater If pt is not established with a provider, would they like to be referred to a provider to establish care? No .   Dental Screening: Recommended annual dental exams for proper oral hygiene  Community Resource Referral / Chronic Care Management: CRR required this visit?  No   CCM required this visit?  No      Plan:     I have personally reviewed and noted the following in the patient's chart:   . Medical and social history . Use of alcohol, tobacco or illicit drugs  . Current medications and supplements . Functional ability and status . Nutritional status . Physical activity . Advanced directives . List of other physicians . Hospitalizations, surgeries, and ER visits in previous 12 months . Vitals . Screenings to include cognitive, depression, and falls . Referrals and appointments  In addition, I have reviewed and discussed with patient certain preventive protocols, quality metrics, and best practice recommendations. A written personalized care plan for preventive services as well as general preventive health recommendations were provided to patient.   Due to this being a telephonic visit, the after visit summary with patients personalized plan was offered to patient via mail or my-chart. Patient preferred to pick up at office at next visit.   Kimberly, Coye, LPN   [id number]

## 2020-06-03 NOTE — Progress Notes (Signed)
PCP notes:  Health Maintenance: Flu- due Tdap- insurance/financial Covid- declined Colonoscopy- declined   Abnormal Screenings: none   Patient concerns: Lightheadedness upon waking up in the mornings and sitting up on the side of the bed   Nurse concerns: none   Next PCP appt.: 06/04/2020 @ 10:30 am

## 2020-06-04 ENCOUNTER — Other Ambulatory Visit: Payer: Self-pay

## 2020-06-04 ENCOUNTER — Encounter: Payer: Self-pay | Admitting: Family Medicine

## 2020-06-04 ENCOUNTER — Ambulatory Visit (INDEPENDENT_AMBULATORY_CARE_PROVIDER_SITE_OTHER): Payer: PPO | Admitting: Family Medicine

## 2020-06-04 VITALS — BP 132/74 | HR 67 | Temp 98.0°F | Ht 66.5 in | Wt 226.4 lb

## 2020-06-04 DIAGNOSIS — R1310 Dysphagia, unspecified: Secondary | ICD-10-CM

## 2020-06-04 DIAGNOSIS — E785 Hyperlipidemia, unspecified: Secondary | ICD-10-CM

## 2020-06-04 DIAGNOSIS — R1319 Other dysphagia: Secondary | ICD-10-CM

## 2020-06-04 DIAGNOSIS — Z7189 Other specified counseling: Secondary | ICD-10-CM | POA: Diagnosis not present

## 2020-06-04 DIAGNOSIS — M06041 Rheumatoid arthritis without rheumatoid factor, right hand: Secondary | ICD-10-CM

## 2020-06-04 DIAGNOSIS — M06042 Rheumatoid arthritis without rheumatoid factor, left hand: Secondary | ICD-10-CM

## 2020-06-04 DIAGNOSIS — Z636 Dependent relative needing care at home: Secondary | ICD-10-CM | POA: Diagnosis not present

## 2020-06-04 DIAGNOSIS — K219 Gastro-esophageal reflux disease without esophagitis: Secondary | ICD-10-CM

## 2020-06-04 DIAGNOSIS — C61 Malignant neoplasm of prostate: Secondary | ICD-10-CM

## 2020-06-04 DIAGNOSIS — N183 Chronic kidney disease, stage 3 unspecified: Secondary | ICD-10-CM | POA: Diagnosis not present

## 2020-06-04 DIAGNOSIS — E119 Type 2 diabetes mellitus without complications: Secondary | ICD-10-CM

## 2020-06-04 DIAGNOSIS — E1169 Type 2 diabetes mellitus with other specified complication: Secondary | ICD-10-CM

## 2020-06-04 DIAGNOSIS — Z0000 Encounter for general adult medical examination without abnormal findings: Secondary | ICD-10-CM | POA: Diagnosis not present

## 2020-06-04 DIAGNOSIS — I10 Essential (primary) hypertension: Secondary | ICD-10-CM

## 2020-06-04 LAB — COMPREHENSIVE METABOLIC PANEL
ALT: 20 U/L (ref 0–53)
AST: 18 U/L (ref 0–37)
Albumin: 4.1 g/dL (ref 3.5–5.2)
Alkaline Phosphatase: 58 U/L (ref 39–117)
BUN: 18 mg/dL (ref 6–23)
CO2: 28 mEq/L (ref 19–32)
Calcium: 9.6 mg/dL (ref 8.4–10.5)
Chloride: 104 mEq/L (ref 96–112)
Creatinine, Ser: 1.54 mg/dL — ABNORMAL HIGH (ref 0.40–1.50)
GFR: 44.66 mL/min — ABNORMAL LOW (ref >60.00)
Glucose, Bld: 106 mg/dL — ABNORMAL HIGH (ref 70–99)
Potassium: 4.5 mEq/L (ref 3.5–5.1)
Sodium: 139 mEq/L (ref 135–145)
Total Bilirubin: 0.9 mg/dL (ref 0.2–1.2)
Total Protein: 7 g/dL (ref 6.0–8.3)

## 2020-06-04 LAB — CBC WITH DIFFERENTIAL/PLATELET
Basophils Absolute: 0 10*3/uL (ref 0.0–0.1)
Basophils Relative: 0.6 % (ref 0.0–3.0)
Eosinophils Absolute: 0.2 10*3/uL (ref 0.0–0.7)
Eosinophils Relative: 3 % (ref 0.0–5.0)
HCT: 47.4 % (ref 39.0–52.0)
Hemoglobin: 15.8 g/dL (ref 13.0–17.0)
Lymphocytes Relative: 33.4 % (ref 12.0–46.0)
Lymphs Abs: 2.7 10*3/uL (ref 0.7–4.0)
MCHC: 33.3 g/dL (ref 30.0–36.0)
MCV: 87.8 fl (ref 78.0–100.0)
Monocytes Absolute: 0.9 10*3/uL (ref 0.1–1.0)
Monocytes Relative: 11 % (ref 3.0–12.0)
Neutro Abs: 4.2 10*3/uL (ref 1.4–7.7)
Neutrophils Relative %: 52 % (ref 43.0–77.0)
Platelets: 222 10*3/uL (ref 150.0–400.0)
RBC: 5.4 Mil/uL (ref 4.22–5.81)
RDW: 13.5 % (ref 11.5–15.5)
WBC: 8 10*3/uL (ref 4.0–10.5)

## 2020-06-04 LAB — MICROALBUMIN / CREATININE URINE RATIO
Creatinine,U: 147 mg/dL
Microalb Creat Ratio: 0.5 mg/g (ref 0.0–30.0)
Microalb, Ur: <0.7 mg/dL (ref 0.0–1.9)

## 2020-06-04 LAB — LIPID PANEL
Cholesterol: 172 mg/dL (ref 0–200)
HDL: 25.8 mg/dL — ABNORMAL LOW (ref >39.00)
LDL Cholesterol: 109 mg/dL — ABNORMAL HIGH (ref 0–99)
NonHDL: 146.15
Total CHOL/HDL Ratio: 7
Triglycerides: 188 mg/dL — ABNORMAL HIGH (ref 0.0–149.0)
VLDL: 37.6 mg/dL (ref 0.0–40.0)

## 2020-06-04 LAB — PSA: PSA: 0 ng/mL — ABNORMAL LOW (ref 0.10–4.00)

## 2020-06-04 LAB — VITAMIN D 25 HYDROXY (VIT D DEFICIENCY, FRACTURES): VITD: 26.29 ng/mL — ABNORMAL LOW (ref 30.00–100.00)

## 2020-06-04 LAB — HEMOGLOBIN A1C: Hgb A1c MFr Bld: 6 % (ref 4.6–6.5)

## 2020-06-04 MED ORDER — AMLODIPINE BESYLATE 5 MG PO TABS
5.0000 mg | ORAL_TABLET | Freq: Every day | ORAL | 3 refills | Status: DC
Start: 2020-06-04 — End: 2021-12-18

## 2020-06-04 MED ORDER — ATORVASTATIN CALCIUM 20 MG PO TABS
20.0000 mg | ORAL_TABLET | Freq: Every day | ORAL | 3 refills | Status: DC
Start: 2020-06-04 — End: 2021-12-21

## 2020-06-04 NOTE — Assessment & Plan Note (Addendum)
Last saw uro 2018. S/p seed implants.   Update PSA.

## 2020-06-04 NOTE — Assessment & Plan Note (Signed)
Update levels. 

## 2020-06-04 NOTE — Assessment & Plan Note (Signed)
Discussed, support provided. He has caregiver resources he and daughter are looking into.

## 2020-06-04 NOTE — Assessment & Plan Note (Signed)
Preventative protocols reviewed and updated unless pt declined. Discussed healthy diet and lifestyle.  

## 2020-06-04 NOTE — Assessment & Plan Note (Signed)
Chronic, stable. Endorses am lightheadedness when he first awakens. Suggested changing amlodipine to AM dosing.

## 2020-06-04 NOTE — Assessment & Plan Note (Signed)
Latest check was in prediabetes range after ongoing weight loss. Update A1c.

## 2020-06-04 NOTE — Assessment & Plan Note (Signed)
Chronic, on atorvastatin. Update FLP. The 10-year ASCVD risk score Mikey Bussing DC Brooke Bonito., et al., 2013) is: 46.7%   Values used to calculate the score:     Age: 74 years     Sex: Male     Is Non-Hispanic African American: No     Diabetic: Yes     Tobacco smoker: No     Systolic Blood Pressure: 073 mmHg     Is BP treated: Yes     HDL Cholesterol: 26.7 mg/dL     Total Cholesterol: 171 mg/dL

## 2020-06-04 NOTE — Assessment & Plan Note (Signed)
Weight loss noted. Pt attributes to increased activity and healthier diet choices.

## 2020-06-04 NOTE — Patient Instructions (Addendum)
We will sign you up for Cologuard for colon cancer screening - expect a kit in the mail, let us know if it doesn't arrive by a month.  If interested, check with pharmacy about new 2 shot shingles series (shingrix).  I do recommend COVID vaccine.  Change amlodipine to morning dosing. Let me know if ongoing lightheadedness.  Labs today.  Return as needed or in 6 months for follow up visit.  We will refer you back to rheumatology for rheumatoid arthritis.   Health Maintenance After Age 7 After age 38, you are at a higher risk for certain long-term diseases and infections as well as injuries from falls. Falls are a major cause of broken bones and head injuries in people who are older than age 62. Getting regular preventive care can help to keep you healthy and well. Preventive care includes getting regular testing and making lifestyle changes as recommended by your health care provider. Talk with your health care provider about:  Which screenings and tests you should have. A screening is a test that checks for a disease when you have no symptoms.  A diet and exercise plan that is right for you. What should I know about screenings and tests to prevent falls? Screening and testing are the best ways to find a health problem early. Early diagnosis and treatment give you the best chance of managing medical conditions that are common after age 54. Certain conditions and lifestyle choices may make you more likely to have a fall. Your health care provider may recommend:  Regular vision checks. Poor vision and conditions such as cataracts can make you more likely to have a fall. If you wear glasses, make sure to get your prescription updated if your vision changes.  Medicine review. Work with your health care provider to regularly review all of the medicines you are taking, including over-the-counter medicines. Ask your health care provider about any side effects that may make you more likely to have a fall.  Tell your health care provider if any medicines that you take make you feel dizzy or sleepy.  Osteoporosis screening. Osteoporosis is a condition that causes the bones to get weaker. This can make the bones weak and cause them to break more easily.  Blood pressure screening. Blood pressure changes and medicines to control blood pressure can make you feel dizzy.  Strength and balance checks. Your health care provider may recommend certain tests to check your strength and balance while standing, walking, or changing positions.  Foot health exam. Foot pain and numbness, as well as not wearing proper footwear, can make you more likely to have a fall.  Depression screening. You may be more likely to have a fall if you have a fear of falling, feel emotionally low, or feel unable to do activities that you used to do.  Alcohol use screening. Using too much alcohol can affect your balance and may make you more likely to have a fall. What actions can I take to lower my risk of falls? General instructions  Talk with your health care provider about your risks for falling. Tell your health care provider if: ? You fall. Be sure to tell your health care provider about all falls, even ones that seem minor. ? You feel dizzy, sleepy, or off-balance.  Take over-the-counter and prescription medicines only as told by your health care provider. These include any supplements.  Eat a healthy diet and maintain a healthy weight. A healthy diet includes low-fat dairy products,  low-fat (lean) meats, and fiber from whole grains, beans, and lots of fruits and vegetables. Home safety  Remove any tripping hazards, such as rugs, cords, and clutter.  Install safety equipment such as grab bars in bathrooms and safety rails on stairs.  Keep rooms and walkways well-lit. Activity   Follow a regular exercise program to stay fit. This will help you maintain your balance. Ask your health care provider what types of exercise  are appropriate for you.  If you need a cane or walker, use it as recommended by your health care provider.  Wear supportive shoes that have nonskid soles. Lifestyle  Do not drink alcohol if your health care provider tells you not to drink.  If you drink alcohol, limit how much you have: ? 0-1 drink a day for women. ? 0-2 drinks a day for men.  Be aware of how much alcohol is in your drink. In the [place] one drink equals one typical bottle of beer (12 oz), one-half glass of wine (5 oz), or one shot of hard liquor (1 oz).  Do not use any products that contain nicotine or tobacco, such as cigarettes and e-cigarettes. If you need help quitting, ask your health care provider. Summary  Having a healthy lifestyle and getting preventive care can help to protect your health and wellness after age 46.  Screening and testing are the best way to find a health problem early and help you avoid having a fall. Early diagnosis and treatment give you the best chance for managing medical conditions that are more common for people who are older than age 19.  Falls are a major cause of broken bones and head injuries in people who are older than age 80. Take precautions to prevent a fall at home.  Work with your health care provider to learn what changes you can make to improve your health and wellness and to prevent falls. This information is not intended to replace advice given to you by your health care provider. Make sure you discuss any questions you have with your health care provider. Document Revised: 01/18/2019 Document Reviewed: 08/10/2017 Elsevier Patient Education  2020 [employer].

## 2020-06-04 NOTE — Assessment & Plan Note (Signed)
Advanced directive - working on this - needs to get notarized. HCPOA would be daughter Kevin Campbell.Asked to bring [place] a copy when completed.

## 2020-06-04 NOTE — Assessment & Plan Note (Signed)
Overdue for f/u - will re refer. Not on disease modifying agent. Previously saw [facility].

## 2020-06-04 NOTE — Progress Notes (Signed)
This visit was conducted in person.  BP 132/74 (BP Location: Left Arm, Patient Position: Sitting, Cuff Size: Large)   Pulse 67   Temp 98 F (36.7 C) (Temporal)   Ht 5' 6.5" (1.689 m)   Wt 226 lb 6 oz (102.7 kg)   SpO2 96%   BMI 35.99 kg/m    CC: CPE Subjective:    Patient ID: Kevin Campbell, male    DOB: 1946/07/19, 74 y.o.   MRN: [id number]  HPI: Kevin Campbell is a 74 y.o. male presenting on 06/04/2020 for Annual Exam (Prt 2. )   Saw health advisor yesterday for medicare wellness visit. Note reviewed.  Notes some lightheadedness in the mornings when he awakens.   No exam data present    Clinical Support from 06/03/2020 in [facility] at [facility] Total Score 0      Fall Risk  06/03/2020 05/30/2019 05/22/2018 05/11/2017 05/10/2017  Falls in the past year? 0 0 Yes No No  Comment - - accidental fall off lawn mower; injury to right rib area - -  Number falls in past yr: 0 - 1 - -  Injury with Fall? 0 - Yes - -  Risk for fall due to : Medication side effect - - - -  Follow up Falls evaluation completed;Falls prevention discussed - - - -    Cares for wife - significant caregiver burden. Daughter lives next door. Neighbor helps some. 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 Has recently received resources for caregiver. New dog at home.   Lower back pain for last few weeks.  Weight loss noted - increased house work at home, also changed diet to healthier options, smaller portion sizes.   Preventative: Colonoscopy 2011 - 1 HP, diverticulosis ([place]). Discussed options - would like stool kit - will set up with Cologuard.  H/o prostate cancer2013s/p radioactive seed implant(MacDiarmid) last seen 2018.  Lung cancer screening - not eligible Flu shotyearly Pneumovax 2013, prevnar 2019, pneumovax 05/2019 COVID vaccine - discussed, may get [employer].  Td 2008  Shingrix - discussed, to check at insurance  Advanced directive - working on  this - needs to get notarized. HCPOA would be daughter [name].Asked to bring [place] a copy when completed.  Seat belt use discussed. Sunscreen use discussed. No changing moles on skin.  Ex smoker - quit 1992  Alcohol - none  Dentist - has not seen recently - has upper plate  Eye exam - yearly s/p cataract surgery fall 2020  Bowel - no constipation Bladder - h/o incontinence completed PTNS without benefit. Nocturia x3-4 at night.   Caffeine: 2 cups coffee occasionally, soda throughout the day Lives with wife, no pets. 2 grown children Occupation: truck driver Edu: HS Activity: sedentary, some [place], good fruits and vegetables     Relevant past medical, surgical, family and social history reviewed and updated as indicated. Interim medical history since our last visit reviewed. Allergies and medications reviewed and updated. Outpatient Medications Prior to Visit  Medication Sig Dispense Refill  . aspirin EC 81 MG tablet Take 81 mg by mouth daily.    [name] amLODipine (NORVASC) 5 MG tablet Take 1 tablet (5 mg total) by mouth daily. 90 tablet 3  . atorvastatin (LIPITOR) 20 MG tablet Take 1 tablet (20 mg total) by mouth daily at 6 PM. 90 tablet 3  . omeprazole (PRILOSEC) 20 MG capsule Take 1 capsule (20 mg total) by mouth daily.  For 2 weeks then as needed 30 capsule 3   No facility-administered medications prior to visit.     Per HPI unless specifically indicated in ROS section below Review of Systems  Constitutional: Negative for activity change, appetite change, chills, fatigue, fever and unexpected weight change.  HENT: Negative for hearing loss.   Eyes: Negative for visual disturbance.  Respiratory: Negative for cough, chest tightness, shortness of breath and wheezing.   Cardiovascular: Negative for chest pain, palpitations and leg swelling.  Gastrointestinal: Negative for abdominal distention, abdominal pain, blood in stool, constipation,  diarrhea, nausea and vomiting.  Genitourinary: Negative for difficulty urinating and hematuria.  Musculoskeletal: Negative for arthralgias, myalgias and neck pain.  Skin: Negative for rash.  Neurological: Negative for dizziness, seizures, syncope and headaches.  Hematological: Negative for adenopathy. Does not bruise/bleed easily.  Psychiatric/Behavioral: Negative for dysphoric mood. The patient is not nervous/anxious.    Objective:  BP 132/74 (BP Location: Left Arm, Patient Position: Sitting, Cuff Size: Large)   Pulse 67   Temp 98 F (36.7 C) (Temporal)   Ht 5' 6.5" (1.689 m)   Wt 226 lb 6 oz (102.7 kg)   SpO2 96%   BMI 35.99 kg/m   Wt Readings from Last 3 Encounters:  06/04/20 226 lb 6 oz (102.7 kg)  05/30/19 234 lb (106.1 kg)  05/29/18 244 lb 12 oz (111 kg)      Physical Exam Vitals and nursing note reviewed.  Constitutional:      General: He is not in acute distress.    Appearance: Normal appearance. He is well-developed. He is not ill-appearing.  HENT:     Head: Normocephalic and atraumatic.     Right Ear: Hearing, tympanic membrane, ear canal and external ear normal.     Left Ear: Hearing, tympanic membrane, ear canal and external ear normal.     Mouth/Throat:     Pharynx: Uvula midline.  Eyes:     General: No scleral icterus.    Extraocular Movements: Extraocular movements intact.     Conjunctiva/sclera: Conjunctivae normal.     Pupils: Pupils are equal, round, and reactive to light.  Neck:     Thyroid: No thyroid mass, thyromegaly or thyroid tenderness.     Vascular: No carotid bruit.  Cardiovascular:     Rate and Rhythm: Normal rate and regular rhythm.     Pulses: Normal pulses.          Radial pulses are 2+ on the right side and 2+ on the left side.     Heart sounds: Normal heart sounds. No murmur heard.   Pulmonary:     Effort: Pulmonary effort is normal. No respiratory distress.     Breath sounds: Normal breath sounds. No wheezing, rhonchi or rales.    Abdominal:     General: Abdomen is flat. Bowel sounds are normal. There is no distension.     Palpations: Abdomen is soft. There is no mass.     Tenderness: There is no abdominal tenderness. There is no right CVA tenderness, left CVA tenderness, guarding or rebound.     Hernia: No hernia is present.  Musculoskeletal:        General: Normal range of motion.     Cervical back: Normal range of motion and neck supple.     Right lower leg: No edema.     Left lower leg: No edema.     Comments: No midline lumbar spine tenderness  Lymphadenopathy:     Cervical: No cervical adenopathy.  Skin:    General: Skin is warm and dry.     Findings: No rash.  Neurological:     General: No focal deficit present.     Mental Status: He is alert and oriented to person, place, and time.     Comments: CN grossly intact, station and gait intact  Psychiatric:        Mood and Affect: Mood normal.        Behavior: Behavior normal.        Thought Content: Thought content normal.        Judgment: Judgment normal.       Results for orders placed or performed in visit on 05/24/19  CBC with Differential/Platelet  Result Value Ref Range   WBC 7.8 4.0 - 10.5 K/uL   RBC 5.17 4.22 - 5.81 Mil/uL   Hemoglobin 15.7 13.0 - 17.0 g/dL   HCT 45.3 39 - 52 %   MCV 87.7 78.0 - 100.0 fl   MCHC 34.6 30.0 - 36.0 g/dL   RDW 13.6 11.5 - 15.5 %   Platelets 206.0 150 - 400 K/uL   Neutrophils Relative % 50.2 43 - 77 %   Lymphocytes Relative 34.3 12 - 46 %   Monocytes Relative 11.2 3 - 12 %   Eosinophils Relative 3.8 0 - 5 %   Basophils Relative 0.5 0 - 3 %   Neutro Abs 3.9 1.4 - 7.7 K/uL   Lymphs Abs 2.7 0.7 - 4.0 K/uL   Monocytes Absolute 0.9 0 - 1 K/uL   Eosinophils Absolute 0.3 0 - 0 K/uL   Basophils Absolute 0.0 0 - 0 K/uL  Microalbumin / creatinine urine ratio  Result Value Ref Range   Microalb, Ur <0.7 0.0 - 1.9 mg/dL   Creatinine,U 139.4 mg/dL   Microalb Creat Ratio 0.5 0.0 - 30.0 mg/g  PSA  Result Value Ref  Range   PSA 0.00 Repeated and verified X2. (L) 0.10 - 4.00 ng/mL  Hemoglobin A1c  Result Value Ref Range   Hgb A1c MFr Bld 6.2 4.6 - 6.5 %  Comprehensive metabolic panel  Result Value Ref Range   Sodium 140 135 - 145 mEq/L   Potassium 4.6 3.5 - 5.1 mEq/L   Chloride 105 96 - 112 mEq/L   CO2 27 19 - 32 mEq/L   Glucose, Bld 107 (H) 70 - 99 mg/dL   BUN 19 6 - 23 mg/dL   Creatinine, Ser 1.35 0.40 - 1.50 mg/dL   Total Bilirubin 0.9 0.2 - 1.2 mg/dL   Alkaline Phosphatase 63 39 - 117 U/L   AST 17 0 - 37 U/L   ALT 17 0 - 53 U/L   Total Protein 6.7 6.0 - 8.3 g/dL   Albumin 4.1 3.5 - 5.2 g/dL   Calcium 9.5 8.4 - 10.5 mg/dL   GFR 52.14 (L) >60.00 mL/min  Lipid panel  Result Value Ref Range   Cholesterol 171 0 - 200 mg/dL   Triglycerides 168.0 (H) 0 - 149 mg/dL   HDL 26.70 (L) >39.00 mg/dL   VLDL 33.6 0.0 - 40.0 mg/dL   LDL Cholesterol 111 (H) 0 - 99 mg/dL   Total CHOL/HDL Ratio 6    NonHDL 144.79    Assessment & Plan:  This visit occurred during the SARS-CoV-2 public health emergency.  Safety protocols were in place, including screening questions prior to the visit, additional usage of staff PPE, and extensive cleaning of exam room while observing appropriate contact time as indicated for  disinfecting solutions.   Problem List Items Addressed This Visit    Severe obesity (BMI 35.0-39.9) with comorbidity ([place])    Weight loss noted. Pt attributes to increased activity and healthier diet choices.       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 Not on disease modifying agent. Previously saw [facility].       Relevant Orders   Ambulatory referral to Rheumatology   Primary prostate adenocarcinoma [facility])    Last saw uro 2018. S/p seed implants.   Update PSA.       Relevant Orders   PSA   CBC with Differential/Platelet   Healthcare maintenance - Primary    Preventative protocols reviewed and updated  unless pt declined. Discussed healthy diet and lifestyle.       GERD    Managed with glass of milk. Now off PPI. Update labs.       Essential hypertension    Chronic, stable. Endorses am lightheadedness when he first awakens. Suggested changing amlodipine to AM dosing.       Relevant Medications   amLODipine (NORVASC) 5 MG tablet   atorvastatin (LIPITOR) 20 MG tablet   Dysphagia    Ongoing issue - intermittent choking sensation. Will check barium swallow, discussed possible GI eval.       Relevant Orders   DG ESOPHAGUS W DOUBLE CM (HD)   Dyslipidemia associated with type 2 diabetes mellitus (HCC)    Chronic, on atorvastatin. Update FLP. The 10-year ASCVD risk score Mikey Bussing DC Brooke Bonito., et al., 2013) is: 46.7%   Values used to calculate the score:     Age: 51 years     Sex: Male     Is Non-Hispanic African American: No     Diabetic: Yes     Tobacco smoker: No     Systolic Blood Pressure: 707 mmHg     Is BP treated: Yes     HDL Cholesterol: 26.7 mg/dL     Total Cholesterol: 171 mg/dL       Relevant Medications   atorvastatin (LIPITOR) 20 MG tablet   Diet-controlled diabetes mellitus ([place])    Latest check was in prediabetes range after ongoing weight loss. Update A1c.       Relevant Medications   atorvastatin (LIPITOR) 20 MG tablet   Other Relevant Orders   Hemoglobin A1c   Microalbumin / creatinine urine ratio   CKD (chronic kidney disease) stage 3, GFR 30-59 ml/min    Update levels.       Relevant Orders   VITAMIN D 25 Hydroxy (Vit-D Deficiency, Fractures)   Caregiver burden    Discussed, support provided. He has caregiver resources he and daughter are looking into.      Advanced care planning/counseling discussion    Advanced directive - working on this - needs to get notarized. HCPOA would be daughter [name].Asked to bring [place] a copy when completed.           Meds ordered this encounter  Medications  . amLODipine (NORVASC) 5 MG tablet    Sig:  Take 1 tablet (5 mg total) by mouth daily.    Dispense:  90 tablet    Refill:  3  . atorvastatin (LIPITOR) 20 MG tablet    Sig: Take 1 tablet (20 mg total) by mouth daily at 6 PM.    Dispense:  90 tablet    Refill:  3   Orders Placed  This Encounter  Procedures  . DG ESOPHAGUS W DOUBLE CM (HD)    Standing Status:   Future    Standing Expiration Date:   06/04/2021    Order Specific Question:   Reason for Exam (SYMPTOM  OR DIAGNOSIS REQUIRED)    Answer:   dysphagia, weight loss    Order Specific Question:   Preferred imaging location?    Answer:   [facility]    Order Specific Question:   Radiology Contrast Protocol - do NOT remove file path    Answer:   [email or web address]  . Lipid panel  . Comprehensive metabolic panel  . PSA  . CBC with Differential/Platelet  . Hemoglobin A1c  . Microalbumin / creatinine urine ratio  . VITAMIN D 25 Hydroxy (Vit-D Deficiency, Fractures)  . Ambulatory referral to Rheumatology    Referral Priority:   Routine    Referral Type:   Consultation    Referral Reason:   Specialty Services Required    Requested Specialty:   Rheumatology    Number of Visits Requested:   1    Patient instructions: We will sign you up for Cologuard for colon cancer screening - expect a kit in the mail, let us know if it doesn't arrive by a month.  If interested, check with pharmacy about new 2 shot shingles series (shingrix).  I do recommend COVID vaccine.  Change amlodipine to morning dosing. Let me know if ongoing lightheadedness.  Labs today.  Return as needed or in 6 months for follow up visit.  We will refer you back to rheumatology for rheumatoid arthritis.   Follow up plan: Return in about 1 year (around 06/04/2021), or if symptoms worsen or fail to improve, for annual exam, prior fasting for blood work, medicare wellness visit.  Ria Bush, MD

## 2020-06-04 NOTE — Assessment & Plan Note (Signed)
Managed with glass of milk. Now off PPI. Update labs.

## 2020-06-04 NOTE — Assessment & Plan Note (Addendum)
Ongoing issue - intermittent choking sensation. Will check barium swallow, discussed possible GI eval.

## 2020-06-11 DIAGNOSIS — Z1211 Encounter for screening for malignant neoplasm of colon: Secondary | ICD-10-CM | POA: Diagnosis not present

## 2020-06-11 DIAGNOSIS — Z1212 Encounter for screening for malignant neoplasm of rectum: Secondary | ICD-10-CM | POA: Diagnosis not present

## 2020-06-11 LAB — COLOGUARD: Cologuard: NEGATIVE

## 2020-06-17 ENCOUNTER — Ambulatory Visit
Admission: RE | Admit: 2020-06-17 | Discharge: 2020-06-17 | Disposition: A | Discharge status: Home / Self Care | Payer: PPO | Source: Ambulatory Visit | Attending: Family Medicine | Admitting: Family Medicine

## 2020-06-17 ENCOUNTER — Other Ambulatory Visit: Payer: Self-pay

## 2020-06-17 ENCOUNTER — Other Ambulatory Visit: Payer: Self-pay | Admitting: Family Medicine

## 2020-06-17 DIAGNOSIS — R1319 Other dysphagia: Secondary | ICD-10-CM

## 2020-06-17 DIAGNOSIS — R1310 Dysphagia, unspecified: Secondary | ICD-10-CM | POA: Insufficient documentation

## 2020-06-17 DIAGNOSIS — K449 Diaphragmatic hernia without obstruction or gangrene: Secondary | ICD-10-CM | POA: Diagnosis not present

## 2020-06-18 LAB — COLOGUARD: COLOGUARD: NEGATIVE

## 2020-06-19 ENCOUNTER — Other Ambulatory Visit: Payer: Self-pay | Admitting: Family Medicine

## 2020-06-19 ENCOUNTER — Encounter: Payer: Self-pay | Admitting: Family Medicine

## 2020-06-19 DIAGNOSIS — R1319 Other dysphagia: Secondary | ICD-10-CM

## 2020-06-19 DIAGNOSIS — I6529 Occlusion and stenosis of unspecified carotid artery: Secondary | ICD-10-CM | POA: Insufficient documentation

## 2020-06-20 ENCOUNTER — Encounter: Payer: Self-pay | Admitting: Family Medicine

## 2020-06-20 ENCOUNTER — Telehealth: Payer: Self-pay

## 2020-06-20 NOTE — Telephone Encounter (Signed)
Attempted to contact pt.  No answer.  Vm box not set up.  Need to notify pt that Cologuard was negative and he can repeat in 3 yrs.

## 2020-06-23 NOTE — Telephone Encounter (Signed)
Spoke with pt relaying results.  Pt verbalizes understanding.  

## 2020-07-01 ENCOUNTER — Other Ambulatory Visit: Payer: Self-pay | Admitting: Family Medicine

## 2020-07-01 DIAGNOSIS — I6523 Occlusion and stenosis of bilateral carotid arteries: Secondary | ICD-10-CM

## 2020-07-01 DIAGNOSIS — R1319 Other dysphagia: Secondary | ICD-10-CM

## 2020-07-03 DIAGNOSIS — M25561 Pain in right knee: Secondary | ICD-10-CM | POA: Diagnosis not present

## 2020-07-03 DIAGNOSIS — M1711 Unilateral primary osteoarthritis, right knee: Secondary | ICD-10-CM | POA: Diagnosis not present

## 2020-07-09 ENCOUNTER — Encounter: Payer: Self-pay | Admitting: Gastroenterology

## 2020-07-25 ENCOUNTER — Ambulatory Visit (HOSPITAL_COMMUNITY)
Admission: RE | Admit: 2020-07-25 | Payer: PPO | Source: Ambulatory Visit | Attending: Family Medicine | Admitting: Family Medicine

## 2020-08-14 ENCOUNTER — Ambulatory Visit (HOSPITAL_COMMUNITY)
Admission: RE | Admit: 2020-08-14 | Discharge: 2020-08-14 | Disposition: A | Discharge status: Home / Self Care | Payer: PPO | Source: Ambulatory Visit | Attending: Cardiology | Admitting: Cardiology

## 2020-08-14 ENCOUNTER — Other Ambulatory Visit: Payer: Self-pay

## 2020-08-14 DIAGNOSIS — I6523 Occlusion and stenosis of bilateral carotid arteries: Secondary | ICD-10-CM | POA: Diagnosis not present

## 2020-08-24 ENCOUNTER — Encounter: Payer: Self-pay | Admitting: Family Medicine

## 2020-08-24 DIAGNOSIS — I6523 Occlusion and stenosis of bilateral carotid arteries: Secondary | ICD-10-CM | POA: Insufficient documentation

## 2020-09-09 ENCOUNTER — Encounter: Payer: Self-pay | Admitting: Gastroenterology

## 2020-09-09 ENCOUNTER — Ambulatory Visit: Payer: PPO | Admitting: Gastroenterology

## 2020-09-09 VITALS — BP 140/76 | HR 76 | Ht 66.0 in | Wt 240.1 lb

## 2020-09-09 DIAGNOSIS — K449 Diaphragmatic hernia without obstruction or gangrene: Secondary | ICD-10-CM

## 2020-09-09 DIAGNOSIS — R1319 Other dysphagia: Secondary | ICD-10-CM

## 2020-09-09 NOTE — Progress Notes (Signed)
[facility] Consult Note:  History: Kevin Campbell 09/09/2020  Referring provider: Ria Bush, MD  Reason for consult/chief complaint: Dysphagia (sometimes when eating says it stays in her throat and loses his sense of where he is at like he blacks )   Subjective  HPI:  This is a very pleasant 74 year old man referred by primary care for dysphagia.  For about the last 6 months, he has had frequent episodes of food stuck in the chest, which will make him feel "strange" like he might pass out.  It then passes on its own or after he drinks little water.  Episodes occur nearly every day.  He has years of intermittent heartburn mainly with food triggers and does not feel the need to take medicines for that regularly.  He denies nausea, vomiting, chronic abdominal pain, altered bowel habits or rectal bleeding.  No prior upper endoscopy.  His twin sister has had similar problems and needed endoscopic dilation.   ROS:  Review of Systems  Constitutional: Negative for appetite change and unexpected weight change.  HENT: Negative for mouth sores and voice change.   Eyes: Negative for pain and redness.  Respiratory: Negative for cough and shortness of breath.   Cardiovascular: Negative for chest pain and palpitations.  Genitourinary: Negative for dysuria and hematuria.  Musculoskeletal: Positive for arthralgias. Negative for myalgias.  Skin: Negative for pallor and rash.  Neurological: Negative for weakness and headaches.  Hematological: Negative for adenopathy.     Past Medical History: Past Medical History:  Diagnosis Date   BPH (benign prostatic hypertrophy)    Carpal tunnel syndrome on both sides    Cataract 05/22/2018   bilateral eyes   Colon polyps 2011   [name]   GERD (gastroesophageal reflux disease)    watches diet   Nocturia    Obstructive sleep apnea    Overactive bladder    due to prostate seed implant radiation, failed  myrbetriq, rapaflo, jalyn, now PTNS (McDiarmid)   Prostate cancer ([place]) 08/24/12   Adenocarcinoma,gleason=3+4=7,& 3+3=6,PSA=5.75,volume=23cc(5/12)cores positive; treated with XRT and radioactive seed implant, doing well ([place])   Rheumatoid arthritis ([place])    of hands by Xray but RF, CCP, CRP negative (12/2013)   Seasonal allergies      Past Surgical History: Past Surgical History:  Procedure Laterality Date   CARDIOVASCULAR STRESS TEST  11-26-2010   Normal lexiscan nuclear study with inferior thinning but no ischemia   CLAVICLE SURGERY Left 1968   REMOVAL OF BONE SPUR FROM PREVIOUS FX YEARS AGO   COLONOSCOPY  2011   1 polyp sigmoid (hyperplastic), diverticulosis, int hemorrhoids [facility] Kocher at [facility])   KNEE ARTHROSCOPY Left JAN 2013   KNEE ARTHROSCOPY Right 12/2017   Dr. [facility]   PROSTATE BIOPSY  08/24/12   Adenocarcinoma   RADIOACTIVE SEED IMPLANT N/A 02/16/2013   Procedure: RADIOACTIVE PROSTATE SEED IMPLANT;  Surgeon: Claybon Jabs, MD   SHOULDER ARTHROSCOPY WITH BICEPS TENDON REPAIR Right APRIL 2013   TOTAL KNEE ARTHROPLASTY  09/26/2012   Procedure: TOTAL KNEE ARTHROPLASTY;  Surgeon: Sydnee Cabal, MD;  Location: [facility];  Service: Orthopedics;  Laterality: Left;     Family History: Family History  Problem Relation Age of Onset   Coronary artery disease Mother 104       3vCABG   Arthritis Mother    Rheumatologic disease Mother    Cancer Father        lung, smoker   Cancer Sister  twin sister - breast   Stroke Neg Hx    Diabetes Neg Hx     Social History: Social History   Socioeconomic History   Marital status: Married    Spouse name: Not on file   Number of children: Not on file   Years of education: Not on file   Highest education level: Not on file  Occupational History   Not on file  Tobacco Use   Smoking status: Former Smoker    Years: 5.00    Types: Cigars    Quit date: 10/11/1990    Years since quitting:  29.9   Smokeless tobacco: Never Used   Tobacco comment: usually  chewed on cigars  Vaping Use   Vaping Use: Never used  Substance and Sexual Activity   Alcohol use: No   Drug use: No   Sexual activity: Not on file  Other Topics Concern   Not on file  Social History Narrative   Caffeine: 2 cups coffee occasionally, soda throughout the day   Lives with wife, no pets.  2 grown children   Occupation: truck driver   Edu: HS   Activity: sedentary   Diet: rare water, good fruits and vegetables   Social Determinants of Radio broadcast assistant Strain: Low Risk    Difficulty of Paying Living Expenses: Not hard at all  Food Insecurity: No Food Insecurity   Worried About Charity fundraiser in the Last Year: Never true   Arboriculturist in the Last Year: Never true  Transportation Needs: No Transportation Needs   Lack of Transportation (Medical): No   Lack of Transportation (Non-Medical): No  Physical Activity: Inactive   Days of Exercise per Week: 0 days   Minutes of Exercise per Session: 0 min  Stress: Stress Concern Present   Feeling of Stress : To some extent  Social Connections:    Frequency of Communication with Friends and Family: Not on file   Frequency of Social Gatherings with Friends and Family: Not on file   Attends Religious Services: Not on file   Active Member of Clubs or Organizations: Not on file   Attends Archivist Meetings: Not on file   Marital Status: Not on file   Recently lost his wife of 84 years from complications of dementia and kidney disease   allergies: Allergies  Allergen Reactions   Benadryl [Diphenhydramine Hcl] Shortness Of Breath   Celebrex [Celecoxib] Other (See Comments)    Extreme drowsiness   Codeine Other (See Comments)    Extreme fatigue; dizziness   Flomax [Tamsulosin Hcl] Other (See Comments)    "can't sleep"   Hydrocodone Other (See Comments)    Drunk feeling    Outpatient Meds: Current  Outpatient Medications  Medication Sig Dispense Refill   amLODipine (NORVASC) 5 MG tablet Take 1 tablet (5 mg total) by mouth daily. 90 tablet 3   aspirin EC 81 MG tablet Take 81 mg by mouth daily.     atorvastatin (LIPITOR) 20 MG tablet Take 1 tablet (20 mg total) by mouth daily at 6 PM. 90 tablet 3   No current facility-administered medications for this visit.      ___________________________________________________________________ Objective   Exam:  BP 140/76    Pulse 76    Ht 5\' 6"  (1.676 m)    Wt 240 lb 2 oz (108.9 kg)    BMI 38.76 kg/m    General: Well-appearing  Eyes: sclera anicteric, no redness  ENT: oral  mucosa moist without lesions, no cervical or supraclavicular lymphadenopathy.  Dentures in good condition  CV: RRR without murmur, S1/S2, no JVD, no peripheral edema  Resp: clear to auscultation bilaterally, normal RR and effort noted  GI: soft, obese, no tenderness, with active bowel sounds. No guarding or palpable organomegaly noted.  Skin; warm and dry, no rash or jaundice noted  Neuro: awake, alert and oriented x 3. 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     Electronically Signed   By: Marcello Moores  Register   On: 06/17/2020 10:58   Signed by Register, Anola Gurney., MD on 06/17/2020 11:01 AM Narrative & Impression CLINICAL DATA:  Dysphagia.  Weight loss.   EXAM: ESOPHOGRAM / BARIUM SWALLOW / BARIUM TABLET STUDY   TECHNIQUE: Combined double contrast and single contrast examination performed using effervescent crystals, thick barium liquid, and thin barium liquid. The patient was observed with fluoroscopy swallowing a 13 mm barium sulphate  tablet.   FLUOROSCOPY TIME:  Fluoroscopy Time:  4 minutes 12 seconds   Radiation Exposure Index (if provided by the fluoroscopic device): 108.4 mGy   COMPARISON:  CT chest 02/10/2016.   FINDINGS: Prominent cervical osteophytes with mild posterior deformity of the cervical esophagus noted. Small hiatal hernia with prominent B ring. Prominent B ring resulted and delay in passage of standard barium tablet. Tertiary esophageal contractions noted. No reflux noted.   IMPRESSION: 1. Prominent cervical osteophytes with mild posterior deformity of the cervical esophagus noted.   2. Small hiatal hernia with prominent B ring. Prominent B ring results and delay in passage of standard barium tablet.   3. Tertiary esophageal contractions consistent presbyesophagus. No reflux noted.   Electronically Signed: ByMarcello Moores  Register On: 06/17/2020 09:34   Assessment: Encounter Diagnoses  Name Primary?   Esophageal dysphagia Yes   Hiatal hernia     Dysphagia from Schatzki ring with hiatal hernia Mild intermittent heartburn Plan:  Upper endoscopy with dilation.  He was agreeable after discussion of procedure and risks.  The benefits and risks of the planned procedure were described in detail with the patient or (when appropriate) their health care proxy.  Risks were outlined as including, but not limited to, bleeding, infection, perforation, adverse medication reaction leading to cardiac or pulmonary decompensation, pancreatitis (if ERCP).  The limitation of incomplete mucosal visualization was also discussed.  No guarantees or warranties were given.  Has not had Covid vaccines, will test 2 to 3 days prior.  Thank you for the courtesy of this consult.  Please call me with any questions or concerns.  Nelida Meuse III  CC: Referring provider noted above

## 2020-09-09 NOTE — Patient Instructions (Signed)
If you are age 72 or older, your body mass index should be between 23-30. Your Body mass index is 38.76 kg/m. If this is out of the aforementioned range listed, please consider follow up with your Primary Care Provider.  If you are age 31 or younger, your body mass index should be between 19-25. Your Body mass index is 38.76 kg/m. If this is out of the aformentioned range listed, please consider follow up with your Primary Care Provider.   You have been scheduled for a colonoscopy. Please follow written instructions given to you at your visit today.  Please pick up your prep supplies at the pharmacy within the next 1-3 days. If you use inhalers (even only as needed), please bring them with you on the day of your procedure.  Follow up pending the results of your Endoscopy.  It was a pleasure to see you today!  Dr. Loletha Carrow

## 2020-09-19 ENCOUNTER — Other Ambulatory Visit: Payer: Self-pay | Admitting: Gastroenterology

## 2020-09-19 DIAGNOSIS — Z1159 Encounter for screening for other viral diseases: Secondary | ICD-10-CM | POA: Diagnosis not present

## 2020-09-19 LAB — SARS CORONAVIRUS 2 (TAT 6-24 HRS): SARS Coronavirus 2: NEGATIVE

## 2020-09-23 ENCOUNTER — Ambulatory Visit (AMBULATORY_SURGERY_CENTER): Payer: PPO | Admitting: Gastroenterology

## 2020-09-23 ENCOUNTER — Other Ambulatory Visit: Payer: Self-pay

## 2020-09-23 ENCOUNTER — Encounter: Payer: Self-pay | Admitting: Gastroenterology

## 2020-09-23 VITALS — BP 121/91 | HR 59 | Temp 98.7°F | Resp 16 | Ht 66.0 in | Wt 240.0 lb

## 2020-09-23 DIAGNOSIS — R12 Heartburn: Secondary | ICD-10-CM

## 2020-09-23 DIAGNOSIS — R1319 Other dysphagia: Secondary | ICD-10-CM

## 2020-09-23 DIAGNOSIS — R1310 Dysphagia, unspecified: Secondary | ICD-10-CM | POA: Diagnosis not present

## 2020-09-23 MED ORDER — FAMOTIDINE 40 MG PO TABS: 40.0000 mg | ORAL_TABLET | Freq: Every day | ORAL | 1 refills | Status: DC

## 2020-09-23 MED ORDER — SODIUM CHLORIDE 0.9 % IV SOLN: 500.0000 mL | Freq: Once | INTRAVENOUS | Status: DC

## 2020-09-23 NOTE — Progress Notes (Signed)
AR - Check-in CW- VS  

## 2020-09-23 NOTE — Progress Notes (Signed)
PT taken to PACU. Monitors in place. VSS. Report given to RN. 

## 2020-09-23 NOTE — Op Note (Signed)
[facility] Patient Name: Kevin Campbell Procedure Date: 09/23/2020 8:40 AM MRN: [id number] Endoscopist: Mallie Mussel L. Loletha Carrow , MD Age: 74 Referring MD:  Date of Birth: [birth year] Gender: Male Account #: [id number] Procedure:                Upper GI endoscopy Indications:              Esophageal dysphagia (intermittent heartburn, not                            on chronic acid suppression) Medicines:                Monitored Anesthesia Care Procedure:                Pre-Anesthesia Assessment:                           - Prior to the procedure, a History and Physical                            was performed, and patient medications and                            allergies were reviewed. The patient's tolerance of                            previous anesthesia was also reviewed. The risks                            and benefits of the procedure and the sedation                            options and risks were discussed with the patient.                            All questions were answered, and informed consent                            was obtained. Prior Anticoagulants: The patient has                            taken no previous anticoagulant or antiplatelet                            agents. ASA Grade Assessment: III - A patient with                            severe systemic disease. After reviewing the risks                            and benefits, the patient was deemed in                            satisfactory condition to undergo the procedure.  After obtaining informed consent, the endoscope was                            passed under direct vision. Throughout the                            procedure, the patient's blood pressure, pulse, and                            oxygen saturations were monitored continuously. The                            Endoscope was introduced through the mouth, and                            advanced to the second  part of duodenum. The upper                            GI endoscopy was accomplished without difficulty.                            The patient tolerated the procedure fairly well. Scope In: Scope Out: Findings:                 The larynx was normal.                           The examined esophagus was normal. No resistance                            passing scope through EGJ. The scope was withdrawn                            after complete UGI exam. Dilation was performed                            with a Maloney dilator with no resistance at 64 Fr.                           The stomach was normal.                           The cardia and gastric fundus were normal on                            retroflexion.                           The examined duodenum was normal. Complications:            No immediate complications. Estimated Blood Loss:     Estimated blood loss: none. Impression:               - Normal larynx.                           -  Normal esophagus. Dilated.                           - Normal stomach.                           - Normal examined duodenum.                           - No specimens collected.                           No stricture seen (though possibly subtle stricture                            - empiric dilation performed). 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 Recommendation:           - Patient has a contact number available for                            emergencies. The signs and symptoms of potential                            delayed complications were discussed with the                            patient. Return to normal activities tomorrow.                            Written discharge instructions were provided to the                            patient.                           - Resume previous diet.                           - Continue present medications.                           - Use Pepcid (famotidine) 40 mg PO  daily for 8                            weeks.                           - Return to my office at appointment to be                            scheduled. Danamarie Minami L. Loletha Carrow, MD 09/23/2020 9:01:29 AM This report has been signed electronically.

## 2020-09-23 NOTE — Progress Notes (Signed)
Called to room to assist during endoscopic procedure.  Patient ID and intended procedure confirmed with present staff. Received instructions for my participation in the procedure from the performing physician.  

## 2020-09-23 NOTE — Patient Instructions (Signed)
Discharge instructions given. Normal procedure. Prescription sent to pharmacy. Handout on a Dilatation diet. Resume previous medications. YOU HAD AN ENDOSCOPIC PROCEDURE TODAY AT [facility]:   Refer to the procedure report that was given to you for any specific questions about what was found during the examination.  If the procedure report does not answer your questions, please call your gastroenterologist to clarify.  If you requested that your care partner not be given the details of your procedure findings, then the procedure report has been included in a sealed envelope for you to review at your convenience later.  YOU SHOULD EXPECT: Some feelings of bloating in the abdomen. Passage of more gas than usual.  Walking can help get rid of the air that was put into your GI tract during the procedure and reduce the bloating. If you had a lower endoscopy (such as a colonoscopy or flexible sigmoidoscopy) you may notice spotting of blood in your stool or on the toilet paper. If you underwent a bowel prep for your procedure, you may not have a normal bowel movement for a few days.  Please Note:  You might notice some irritation and congestion in your nose or some drainage.  This is from the oxygen used during your procedure.  There is no need for concern and it should clear up in a day or so.  SYMPTOMS TO REPORT IMMEDIATELY:    Following upper endoscopy (EGD)  Vomiting of blood or coffee ground material  New chest pain or pain under the shoulder blades  Painful or persistently difficult swallowing  New shortness of breath  Fever of 100F or higher  Black, tarry-looking stools  For urgent or emergent issues, a gastroenterologist can be reached at any hour by calling [phone]. Do not use MyChart messaging for urgent concerns.    DIET:  We do recommend a small meal at first, but then you may proceed to your regular diet.  Drink plenty of fluids but you should avoid alcoholic  beverages for 24 hours.  ACTIVITY:  You should plan to take it easy for the rest of today and you should NOT DRIVE or use heavy machinery until tomorrow (because of the sedation medicines used during the test).    FOLLOW UP: Our staff will call the number listed on your records 48-72 hours following your procedure to check on you and address any questions or concerns that you may have regarding the information given to you following your procedure. If we do not reach you, we will leave a message.  We will attempt to reach you two times.  During this call, we will ask if you have developed any symptoms of COVID 19. If you develop any symptoms (ie: fever, flu-like symptoms, shortness of breath, cough etc.) before then, please call [phone].  If you test positive for Covid 19 in the 2 weeks post procedure, please call and report this information to [place].    If any biopsies were taken you will be contacted by phone or by letter within the next 1-3 weeks.  Please call us at [phone] if you have not heard about the biopsies in 3 weeks.    SIGNATURES/CONFIDENTIALITY: You and/or your care partner have signed paperwork which will be entered into your electronic medical record.  These signatures attest to the fact that that the information above on your After Visit Summary has been reviewed and is understood.  Full responsibility of the confidentiality of this discharge information lies with  you and/or your care-partner.

## 2020-09-25 ENCOUNTER — Telehealth: Payer: Self-pay | Admitting: *Deleted

## 2020-09-25 NOTE — Telephone Encounter (Signed)
No answer for post procedure callback. And unable to leave message. Will call back later today.

## 2020-11-12 ENCOUNTER — Ambulatory Visit: Payer: PPO | Admitting: Gastroenterology

## 2020-12-17 ENCOUNTER — Encounter: Payer: Self-pay | Admitting: *Deleted

## 2020-12-22 ENCOUNTER — Encounter: Payer: Self-pay | Admitting: Gastroenterology

## 2020-12-22 ENCOUNTER — Ambulatory Visit: Payer: PPO | Admitting: Gastroenterology

## 2020-12-22 ENCOUNTER — Other Ambulatory Visit: Payer: Self-pay

## 2020-12-22 VITALS — BP 130/80 | HR 84 | Ht 66.0 in | Wt 243.0 lb

## 2020-12-22 DIAGNOSIS — R1319 Other dysphagia: Secondary | ICD-10-CM | POA: Diagnosis not present

## 2020-12-22 NOTE — Progress Notes (Signed)
[facility] Progress Note  Chief Complaint: Esophageal dysphagia  Subjective  History: Clinic visit 09/09/2020 for dysphagia with features of both mechanical and motility based on history and barium swallow findings.  Episodes make him feel "strange" like he might pass out. EGD 09/23/2020 with no esophagitis or stricture seen.  No resistance passing 52 French bougie dilator.  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  Kevin Campbell has noticed improvement in the symptoms, but they still occur 2 or 3 days a week.  It is still somewhat difficult to characterize, but feels like a tightness in the throat when swallowing.  He does not cough when eating or drinking.  He also did not feel that the famotidine made any change. Since I last saw him, he was quite sick with Covid the entire month of January, and says he was in bed much of the time and could hardly leave the house.  He has only started feeling better in the last few weeks, and is now back out to doing yard work for neighbors.  ROS: Cardiovascular:  no chest pain Respiratory: no dyspnea.  He has a residual intermittent dry cough  The patient's Past Medical, Family and Social History were reviewed and are on file in the EMR.  Objective:  Med list reviewed  Current Outpatient Medications:  .  amLODipine (NORVASC) 5 MG tablet, Take 1 tablet (5 mg total) by mouth daily., Disp: 90 tablet, Rfl: 3 .  aspirin EC 81 MG tablet, Take 81 mg by mouth daily., Disp: , Rfl:  .  atorvastatin (LIPITOR) 20 MG tablet, Take 1 tablet (20 mg total) by mouth daily at 6 PM., Disp: 90 tablet, Rfl: 3 .  famotidine (PEPCID) 40 MG tablet, Take 1 tablet (40 mg total) by mouth daily., Disp: 30 tablet, Rfl: 1  Current Facility-Administered Medications:  .  0.9 %  sodium chloride infusion, 500 mL, Intravenous, Once, Danis, Estill Cotta III, MD   Vital signs in last 24 hrs: Vitals:   12/22/20 1352  BP: 130/80   Pulse: 84   Wt Readings from Last 3 Encounters:  12/22/20 243 lb (110.2 kg)  09/23/20 240 lb (108.9 kg)  09/09/20 240 lb 2 oz (108.9 kg)    Physical Exam  Well-appearing  HEENT: sclera anicteric, oral mucosa moist without lesions.  Upper dentures, no lower teeth   neck: supple, no thyromegaly, JVD or lymphadenopathy  Cardiac: RRR without murmurs, S1S2 heard, no peripheral edema  Pulm: clear to auscultation bilaterally, normal RR and effort noted  Abdomen: soft, no tenderness, with active bowel sounds. No guarding or palpable hepatosplenomegaly.  Labs:   ___________________________________________ Radiologic studies:   ____________________________________________ Other:   _____________________________________________ Assessment & Plan  Assessment: Encounter Diagnosis  Name Primary?  . 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  He did not improve with a trial of acid suppression, speaking against GERD related dysmotility.  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  I demonstrated a chin tuck maneuver and recommended he always do that with swallowing. Also, cut and chew food is best he can, keep it lubricated with something to drink after every couple of bites.  Lastly, if there is dysmotility in the body of the esophagus, it Kevin Campbell improve with a strong peppermint (altoid) before meals.  He is willing to give it  a try.  I will see him as needed.  20 minutes were spent on this encounter (including chart review, history/exam, counseling/coordination of care, and documentation) > 50% of that time was spent on counseling and coordination of care.  Topics discussed included: Dysphagia.  Nelida Meuse III

## 2020-12-22 NOTE — Patient Instructions (Signed)
If you are age 73 or older, your body mass index should be between 23-30. Your Body mass index is 39.22 kg/m. If this is out of the aforementioned range listed, please consider follow up with your Primary Care Provider.  Due to recent changes in healthcare laws, you may see the results of your imaging and laboratory studies on MyChart before your provider has had a chance to review them.  We understand that in some cases there may be results that are confusing or concerning to you. Not all laboratory results come back in the same time frame and the provider may be waiting for multiple results in order to interpret others.  Please give [place] 48 hours in order for your provider to thoroughly review all the results before contacting the office for clarification of your results.   It was a pleasure to see you today!  Dr. Loletha Carrow

## 2021-04-07 DIAGNOSIS — M25461 Effusion, right knee: Secondary | ICD-10-CM | POA: Diagnosis not present

## 2021-04-07 DIAGNOSIS — M25561 Pain in right knee: Secondary | ICD-10-CM | POA: Diagnosis not present

## 2021-04-10 ENCOUNTER — Telehealth: Payer: Self-pay | Admitting: Family Medicine

## 2021-04-10 NOTE — Telephone Encounter (Signed)
Mr. Ruscitti called in wanted to know if Dr. Darnell Level would write the for the knee replacement if Dr. Berenice Primas would send the order. They will be sending the paperwork today.

## 2021-04-10 NOTE — Telephone Encounter (Signed)
Spoke with pt scheduling pre-op exam on 04/21/21 at 11:00.  R TKR is TBD.  Will watch for surgical clearance form.

## 2021-04-17 ENCOUNTER — Telehealth: Payer: Self-pay | Admitting: *Deleted

## 2021-04-17 NOTE — Telephone Encounter (Signed)
   Name: Kevin Campbell  DOB: June 16, 1946  MRN: [id number]  Primary Cardiologist: None  Chart reviewed as part of pre-operative protocol coverage for R TKA on 04/29/21. Kevin Campbell has history CAD with last cath in 2007, EF 60%, HTN, obesity, and GERD. He was last seen 12/10/2010 by Dr. Stanford Breed.   Because of Kevin Campbell's past medical history and time since last visit, he will require a follow-up visit in order to better assess preoperative cardiovascular risk.  Pre-op covering staff: - Please schedule appointment and call patient to inform them. When scheduled, please add "pre-op clearance" to the appointment notes so provider is aware. - Please contact requesting surgeon's office via preferred method (i.e, phone, fax) to inform them of need for appointment prior to surgery.  Will route to covering staff and remove from preop pool.  Arvil Chaco, PA-C  04/17/2021, 9:59 AM

## 2021-04-17 NOTE — Telephone Encounter (Signed)
Received faxed surgical clearance form from [place].    [Form is in basket on [employer].]

## 2021-04-17 NOTE — Telephone Encounter (Signed)
I was able to reach the pt and inform him that he will need an appt for pre op clearance, as well as to re-est since we have not seen him 2012. Pt is agreeable to plan of care and has been scheduled to see Dr. Stanford Breed as a New pt who will also need pre op clearance as well. I will forward notes to MD for upcoming appt. Will send FYI to surgeon's office pt has appt 04/23/21 with Dr. Stanford Breed.

## 2021-04-17 NOTE — Telephone Encounter (Signed)
I tried to reach pt on his phone though vm is full or not set up. I then called DPR pt's daughter [name]. Left message on daughters phone that the pt will need a NEW PT APPt for pre op clearance; since we have not seen him since 2012. I left message that I have tentatively held the time slot 04/23/21 @ 8:20 with Dr. Stanford Breed. I will need for the pt or his daughter to please call the office back and confirm appt or change to another date and time.

## 2021-04-17 NOTE — Telephone Encounter (Signed)
   [facility] Pre-operative Risk Assessment    Patient Name: HAYDEN KIHARA  DOB: 12-01-1945 MRN: [id number]  HEARTCARE STAFF:  - IMPORTANT!!!!!! Under Visit Info/Reason for Call, type in Other and utilize the format Clearance MM/DD/YY or Clearance TBD. Do not use dashes or single digits. - Please review there is not already an duplicate clearance open for this procedure. - If request is for dental extraction, please clarify the # of teeth to be extracted. - If the patient is currently at the dentist's office, call Pre-Op Callback Staff (MA/nurse) to input urgent request.  - If the patient is not currently in the dentist office, please route to the Pre-Op pool.  Request for surgical clearance:  What type of surgery is being performed? Right total knee arthroplasty  When is this surgery scheduled? 04/29/21  What type of clearance is required (medical clearance vs. Pharmacy clearance to hold med vs. Both)? medical  Are there any medications that need to be held prior to surgery and how long? none  Practice name and name of physician performing surgery? [facility]  What is the office phone number? [phone]   7.   What is the office fax number? [phone]  8.   Anesthesia type (None, local, MAC, general) ? spinal   Fredia Beets 04/17/2021, 9:52 AM  _________________________________________________________________   (provider comments below)

## 2021-04-20 NOTE — Progress Notes (Signed)
Kevin Commons MD Reason for referral-preoperative evaluation prior to knee replacement.  HPI: 75 year old male for preoperative evaluation prior to knee replacement at request of Ria Bush MD. Previously seen but not since 2012. Previous catheterization in 2007 revealed no obstructive coronary disease and normal LV function. Nuclear study 2012 showed inferior thinning but no ischemia. Ejection fraction 49%. Followup Echo showed an ejection fraction of 50-55% and mild left atrial enlargement. Chest CT May 2017 showed coronary calcification.  Carotid Dopplers November 2021 showed 1 to 39% bilateral stenosis.  Patient has dyspnea with more vigorous activities but not routine activities.  He states he can walk half a mile without having dyspnea and he does not have exertional chest pain.  He denies palpitations or syncope.  Occasional indigestion after eating certain foods.  Current Outpatient Medications  Medication Sig Dispense Refill   amLODipine (NORVASC) 5 MG tablet Take 1 tablet (5 mg total) by mouth daily. 90 tablet 3   aspirin EC 81 MG tablet Take 81 mg by mouth daily.     atorvastatin (LIPITOR) 20 MG tablet Take 1 tablet (20 mg total) by mouth daily at 6 PM. 90 tablet 3   famotidine (PEPCID) 40 MG tablet Take 1 tablet (40 mg total) by mouth daily. 30 tablet 1   Current Facility-Administered Medications  Medication Dose Route Frequency Provider Last Rate Last Admin   0.9 %  sodium chloride infusion  500 mL Intravenous Once Danis, Kirke Corin, MD        Allergies  Allergen Reactions   Benadryl [Diphenhydramine Hcl] Shortness Of Breath   Celebrex [Celecoxib] Other (See Comments)    Extreme drowsiness   Codeine Other (See Comments)    Extreme fatigue; dizziness   Flomax [Tamsulosin Hcl] Other (See Comments)    "can't sleep"   Hydrocodone Other (See Comments)    Drunk feeling     Past Medical History:  Diagnosis Date   Allergy    BPH (benign prostatic  hypertrophy)    Carpal tunnel syndrome on both sides    Cataract 05/22/2018   bilateral eyes   Colon polyps 2011   [name]   GERD (gastroesophageal reflux disease)    watches diet   Hiatal hernia    Hypertension    Internal hemorrhoids    Nocturia    Obstructive sleep apnea    Overactive bladder    due to prostate seed implant radiation, failed myrbetriq, rapaflo, jalyn, now PTNS (McDi[place])   Presbyesophagus    Prostate cancer ([place]) 08/24/2012   Adenocarcinoma,gleason=3+4=7,& 3+3=6,PSA=5.75,volume=23cc(5/12)cores positive; treated with XRT and radioactive seed implant, doing well ([place])   Rheumatoid arthritis ([place])    of hands by Xray but RF, CCP, CRP negative (12/2013)   Seasonal allergies    Sleep apnea    pt did wear face mask with CPAP - but not tolerating mask now - not wearing C-PAP    Past Surgical History:  Procedure Laterality Date   CARDIOVASCULAR STRESS TEST  11-26-2010   Normal lexiscan nuclear study with inferior thinning but no ischemia   CLAVICLE SURGERY Left 1968   REMOVAL OF BONE SPUR FROM PREVIOUS FX YEARS AGO   COLONOSCOPY  2011   1 polyp sigmoid (hyperplastic), diverticulosis, int hemorrhoids [facility] Kocher at [facility] [address])   KNEE ARTHROSCOPY Left JAN 2013   KNEE ARTHROSCOPY Right 12/2017   [facility]   PROSTATE BIOPSY  08/24/12   Adenocarcinoma   RADIOACTIVE SEED IMPLANT N/A 02/16/2013   Procedure: RADIOACTIVE PROSTATE SEED IMPLANT;  Surgeon: Claybon Jabs, MD   SHOULDER ARTHROSCOPY WITH BICEPS TENDON REPAIR Right APRIL 2013   TOTAL KNEE ARTHROPLASTY  09/26/2012   Procedure: TOTAL KNEE ARTHROPLASTY;  Surgeon: Sydnee Cabal, MD;  Location: [facility];  Service: Orthopedics;  Laterality: Left;    Social History   Socioeconomic History   Marital status: Widowed    Spouse name: Not on file   Number of children: Not on file   Years of education: Not on file   Highest education level: Not on file  Occupational History   Not on file  Tobacco Use    Smoking status: Former    Types: Cigars    Quit date: 10/11/1990    Years since quitting: 30.5   Smokeless tobacco: Never   Tobacco comments:    usually  chewed on cigars  Vaping Use   Vaping Use: Never used  Substance and Sexual Activity   Alcohol use: No   Drug use: No   Sexual activity: Not Currently  Other Topics Concern   Not on file  Social History Narrative   Caffeine: 2 cups coffee occasionally, soda throughout the day   Lives with wife, no pets.  2 grown children   Occupation: truck driver   Edu: HS   Activity: sedentary   Diet: rare water, good fruits and vegetables   Social Determinants of Radio broadcast assistant Strain: Low Risk    Difficulty of Paying Living Expenses: Not hard at all  Food Insecurity: No Food Insecurity   Worried About Charity fundraiser in the Last Year: Never true   Arboriculturist in the Last Year: Never true  Transportation Needs: No Transportation Needs   Lack of Transportation (Medical): No   Lack of Transportation (Non-Medical): No  Physical Activity: Inactive   Days of Exercise per Week: 0 days   Minutes of Exercise per Session: 0 min  Stress: Stress Concern Present   Feeling of Stress : To some extent  Social Connections: Not on file  Intimate Partner Violence: Not At Risk   Fear of Current or Ex-Partner: No   Emotionally Abused: No   Physically Abused: No   Sexually Abused: No    Family History  Problem Relation Age of Onset   Coronary artery disease Mother 34       3vCABG   Arthritis Mother    Rheumatologic disease Mother    CAD Mother    Cancer Father        lung, smoker   Cancer Sister        twin sister - breast   Stroke Neg Hx    Diabetes Neg Hx    Colon cancer Neg Hx    Rectal cancer Neg Hx    Stomach cancer Neg Hx     ROS: Knee arthralgias but no fevers or chills, productive cough, hemoptysis, dysphasia, odynophagia, melena, hematochezia, dysuria, hematuria, rash, seizure activity, orthopnea, PND,  pedal edema, claudication. Remaining systems are negative.  Physical Exam:   Blood pressure 134/72, pulse 78, height 5\' 7"  (1.702 m), weight 244 lb 6.4 oz (110.9 kg), SpO2 94 %.  General:  Well developed/well nourished in NAD Skin warm/dry Patient not depressed No peripheral clubbing Back-normal HEENT-normal/normal eyelids Neck supple/normal carotid upstroke bilaterally; no bruits; no JVD; no thyromegaly chest - CTA/ normal expansion CV - RRR/normal S1 and S2; no murmurs, rubs or gallops;  PMI nondisplaced Abdomen -NT/ND, no HSM, no mass, + bowel sounds, positive bruit 2+ femoral  pulses, no bruits Ext-no edema, chords, 2+ DP Neuro-grossly nonfocal  ECG -April 21, 2021-sinus rhythm with PACs and no ST changes.  Personally reviewed  A/P  1 preoperative evaluation prior to knee replacement-patient has good functional capacity (he is able to walk a half a mile without having dyspnea or chest pain).  His electrocardiogram shows no ST changes.  He may proceed with knee replacement without further cardiac evaluation.  2 hypertension-blood pressure controlled.  Continue present medications.  3 hyperlipidemia-continue statin.  4 history of coronary calcification-continue aspirin and statin.  5 bruit-we will arrange abdominal ultrasound to exclude aneurysm.  Kirk Ruths, MD

## 2021-04-21 ENCOUNTER — Other Ambulatory Visit: Payer: Self-pay

## 2021-04-21 ENCOUNTER — Telehealth: Payer: Self-pay | Admitting: Family Medicine

## 2021-04-21 ENCOUNTER — Ambulatory Visit (INDEPENDENT_AMBULATORY_CARE_PROVIDER_SITE_OTHER): Payer: PPO | Admitting: Family Medicine

## 2021-04-21 ENCOUNTER — Ambulatory Visit
Admission: RE | Admit: 2021-04-21 | Discharge: 2021-04-21 | Disposition: A | Discharge status: Home / Self Care | Payer: PPO | Source: Ambulatory Visit | Attending: Family Medicine | Admitting: Family Medicine

## 2021-04-21 ENCOUNTER — Encounter: Payer: Self-pay | Admitting: Family Medicine

## 2021-04-21 VITALS — BP 156/98 | HR 80 | Temp 98.4°F | Ht 66.0 in | Wt 245.0 lb

## 2021-04-21 DIAGNOSIS — I517 Cardiomegaly: Secondary | ICD-10-CM | POA: Diagnosis not present

## 2021-04-21 DIAGNOSIS — G4733 Obstructive sleep apnea (adult) (pediatric): Secondary | ICD-10-CM | POA: Diagnosis not present

## 2021-04-21 DIAGNOSIS — I10 Essential (primary) hypertension: Secondary | ICD-10-CM | POA: Diagnosis not present

## 2021-04-21 DIAGNOSIS — C61 Malignant neoplasm of prostate: Secondary | ICD-10-CM | POA: Diagnosis not present

## 2021-04-21 DIAGNOSIS — M06042 Rheumatoid arthritis without rheumatoid factor, left hand: Secondary | ICD-10-CM

## 2021-04-21 DIAGNOSIS — M06041 Rheumatoid arthritis without rheumatoid factor, right hand: Secondary | ICD-10-CM

## 2021-04-21 DIAGNOSIS — Z01818 Encounter for other preprocedural examination: Secondary | ICD-10-CM

## 2021-04-21 DIAGNOSIS — N183 Chronic kidney disease, stage 3 unspecified: Secondary | ICD-10-CM | POA: Diagnosis not present

## 2021-04-21 DIAGNOSIS — E119 Type 2 diabetes mellitus without complications: Secondary | ICD-10-CM

## 2021-04-21 LAB — PROTIME-INR
INR: 1.1 ratio — ABNORMAL HIGH (ref 0.8–1.0)
Prothrombin Time: 12.3 s (ref 9.6–13.1)

## 2021-04-21 LAB — COMPREHENSIVE METABOLIC PANEL
ALT: 23 U/L (ref 0–53)
AST: 24 U/L (ref 0–37)
Albumin: 4.1 g/dL (ref 3.5–5.2)
Alkaline Phosphatase: 60 U/L (ref 39–117)
BUN: 14 mg/dL (ref 6–23)
CO2: 28 mEq/L (ref 19–32)
Calcium: 9.2 mg/dL (ref 8.4–10.5)
Chloride: 105 mEq/L (ref 96–112)
Creatinine, Ser: 1.31 mg/dL (ref 0.40–1.50)
GFR: 54.33 mL/min — ABNORMAL LOW (ref >60.00)
Glucose, Bld: 115 mg/dL — ABNORMAL HIGH (ref 70–99)
Potassium: 4.3 mEq/L (ref 3.5–5.1)
Sodium: 140 mEq/L (ref 135–145)
Total Bilirubin: 0.6 mg/dL (ref 0.2–1.2)
Total Protein: 7 g/dL (ref 6.0–8.3)

## 2021-04-21 LAB — POCT URINALYSIS DIP (MANUAL ENTRY)
Bilirubin, UA: NEGATIVE
Blood, UA: NEGATIVE
Glucose, UA: NEGATIVE mg/dL
Ketones, POC UA: NEGATIVE mg/dL
Leukocytes, UA: NEGATIVE
Nitrite, UA: NEGATIVE
Protein Ur, POC: NEGATIVE mg/dL
Spec Grav, UA: 1.02 (ref 1.010–1.025)
Urobilinogen, UA: 0.2 E.U./dL
pH, UA: 6 (ref 5.0–8.0)

## 2021-04-21 LAB — CBC WITH DIFFERENTIAL/PLATELET
Basophils Absolute: 0.1 10*3/uL (ref 0.0–0.1)
Basophils Relative: 0.8 % (ref 0.0–3.0)
Eosinophils Absolute: 0.2 10*3/uL (ref 0.0–0.7)
Eosinophils Relative: 2.8 % (ref 0.0–5.0)
HCT: 44.8 % (ref 39.0–52.0)
Hemoglobin: 15.4 g/dL (ref 13.0–17.0)
Lymphocytes Relative: 33.2 % (ref 12.0–46.0)
Lymphs Abs: 2.5 10*3/uL (ref 0.7–4.0)
MCHC: 34.3 g/dL (ref 30.0–36.0)
MCV: 86.8 fl (ref 78.0–100.0)
Monocytes Absolute: 0.8 10*3/uL (ref 0.1–1.0)
Monocytes Relative: 11.1 % (ref 3.0–12.0)
Neutro Abs: 3.9 10*3/uL (ref 1.4–7.7)
Neutrophils Relative %: 52.1 % (ref 43.0–77.0)
Platelets: 192 10*3/uL (ref 150.0–400.0)
RBC: 5.17 Mil/uL (ref 4.22–5.81)
RDW: 14.1 % (ref 11.5–15.5)
WBC: 7.4 10*3/uL (ref 4.0–10.5)

## 2021-04-21 LAB — HEMOGLOBIN A1C: Hgb A1c MFr Bld: 6.7 % — ABNORMAL HIGH (ref 4.6–6.5)

## 2021-04-21 NOTE — Telephone Encounter (Signed)
Opened in error

## 2021-04-21 NOTE — Patient Instructions (Addendum)
Send [place] info on covid vaccine to update your chart.  EKG today Labs today  Urinalysis today  Go to [place] outpatient imaging for chest xray today.  We will forward results to Dr Berenice Primas.  Good luck with upcoming surgery!  Return in 2 months for physical

## 2021-04-21 NOTE — Progress Notes (Signed)
Patient ID: Kevin Campbell, male    DOB: 1946/05/04, 75 y.o.   MRN: [id number]  This visit was conducted in person.  BP (!) 156/98   Pulse 80   Temp 98.4 F (36.9 C) (Temporal)   Ht 5\' 6"  (1.676 m)   Wt 245 lb (111.1 kg)   SpO2 98%   BMI 39.54 kg/m    CC: preop eval Subjective:   HPI: Kevin Campbell is a 75 y.o. male presenting on 04/21/2021 for Pre-op Exam and ECG   Wife passed away 08/15/2020. He was her caregiver. 20 lb weight gain since wife passed.  Contracted COVID 10/2020, treated with zinc and vit D with benefit. Symptoms fully resolved. Did not seek medical care.   Upcoming R knee replacement surgery by Dr Berenice Primas scheduled for 04/29/2021 for activity limiting knee pain from known osteoarthritis.  Planning to see Dr Stanford Breed on Thursday.   Has tolerated GETA well in the past, latest knee arthroscopy 2019, L knee replacement 2013. No known difficulty with post-op nausea/vomiting or difficulty awakening after surgery.   Denies chest tightness, dyspnea, leg swelling, headache, dizziness or cough, fever, abdominal pain/nausea/diarrhea. No palpitations.   Intermittent soreness to chest that comes and goes - not exertional, not relieved by rest, not pressure/tightness feeling, drinking a glass of milk resolves this pain.   H/o prostate cancer s/p seed implants 2013.  Lab Results  Component Value Date   PSA 0.00 (L) 06/04/2020   PSA 0.00 Repeated and verified X2. (L) 05/24/2019   PSA 5.75 (H) 07/11/2012       Relevant past medical, surgical, family and social history reviewed and updated as indicated. Interim medical history since our last visit reviewed. Allergies and medications reviewed and updated. Outpatient Medications Prior to Visit  Medication Sig Dispense Refill   amLODipine (NORVASC) 5 MG tablet Take 1 tablet (5 mg total) by mouth daily. 90 tablet 3   aspirin EC 81 MG tablet Take 81 mg by mouth daily.     atorvastatin (LIPITOR) 20 MG tablet Take 1 tablet  (20 mg total) by mouth daily at 6 PM. 90 tablet 3   famotidine (PEPCID) 40 MG tablet Take 1 tablet (40 mg total) by mouth daily. 30 tablet 1   Facility-Administered Medications Prior to Visit  Medication Dose Route Frequency Provider Last Rate Last Admin   0.9 %  sodium chloride infusion  500 mL Intravenous Once Danis, Estill Cotta III, MD         Per HPI unless specifically indicated in ROS section below Review of Systems  Objective:  BP (!) 156/98   Pulse 80   Temp 98.4 F (36.9 C) (Temporal)   Ht 5\' 6"  (1.676 m)   Wt 245 lb (111.1 kg)   SpO2 98%   BMI 39.54 kg/m   Wt Readings from Last 3 Encounters:  04/23/21 244 lb 6.4 oz (110.9 kg)  04/21/21 245 lb (111.1 kg)  12/22/20 243 lb (110.2 kg)      Physical Exam Vitals and nursing note reviewed.  Constitutional:      Appearance: Normal appearance. He is obese. He is not ill-appearing.     Comments: Antalgic gait  HENT:     Head: Normocephalic and atraumatic.  Eyes:     Extraocular Movements: Extraocular movements intact.     Pupils: Pupils are equal, round, and reactive to light.  Cardiovascular:     Rate and Rhythm: Normal rate and regular rhythm.     Pulses:  Normal pulses.     Heart sounds: Normal heart sounds. No murmur heard. Pulmonary:     Effort: Pulmonary effort is normal. No respiratory distress.     Breath sounds: Normal breath sounds. No wheezing, rhonchi or rales.  Musculoskeletal:     Right lower leg: No edema.     Left lower leg: No edema.  Skin:    General: Skin is warm and dry.     Findings: No rash.  Neurological:     Mental Status: He is alert.  Psychiatric:        Mood and Affect: Mood normal.        Behavior: Behavior normal.      Results for orders placed or performed in visit on 04/21/21  Comprehensive metabolic panel  Result Value Ref Range   Sodium 140 135 - 145 mEq/L   Potassium 4.3 3.5 - 5.1 mEq/L   Chloride 105 96 - 112 mEq/L   CO2 28 19 - 32 mEq/L   Glucose, Bld 115 (H) 70 - 99 mg/dL    BUN 14 6 - 23 mg/dL   Creatinine, Ser 1.31 0.40 - 1.50 mg/dL   Total Bilirubin 0.6 0.2 - 1.2 mg/dL   Alkaline Phosphatase 60 39 - 117 U/L   AST 24 0 - 37 U/L   ALT 23 0 - 53 U/L   Total Protein 7.0 6.0 - 8.3 g/dL   Albumin 4.1 3.5 - 5.2 g/dL   GFR 54.33 (L) >60.00 mL/min   Calcium 9.2 8.4 - 10.5 mg/dL  Hemoglobin A1c  Result Value Ref Range   Hgb A1c MFr Bld 6.7 (H) 4.6 - 6.5 %  CBC with Differential/Platelet  Result Value Ref Range   WBC 7.4 4.0 - 10.5 K/uL   RBC 5.17 4.22 - 5.81 Mil/uL   Hemoglobin 15.4 13.0 - 17.0 g/dL   HCT 44.8 39.0 - 52.0 %   MCV 86.8 78.0 - 100.0 fl   MCHC 34.3 30.0 - 36.0 g/dL   RDW 14.1 11.5 - 15.5 %   Platelets 192.0 150.0 - 400.0 K/uL   Neutrophils Relative % 52.1 43.0 - 77.0 %   Lymphocytes Relative 33.2 12.0 - 46.0 %   Monocytes Relative 11.1 3.0 - 12.0 %   Eosinophils Relative 2.8 0.0 - 5.0 %   Basophils Relative 0.8 0.0 - 3.0 %   Neutro Abs 3.9 1.4 - 7.7 K/uL   Lymphs Abs 2.5 0.7 - 4.0 K/uL   Monocytes Absolute 0.8 0.1 - 1.0 K/uL   Eosinophils Absolute 0.2 0.0 - 0.7 K/uL   Basophils Absolute 0.1 0.0 - 0.1 K/uL  Protime-INR  Result Value Ref Range   INR 1.1 (H) 0.8 - 1.0 ratio   Prothrombin Time 12.3 9.6 - 13.1 sec  POCT urinalysis dipstick  Result Value Ref Range   Color, UA yellow yellow   Clarity, UA clear clear   Glucose, UA negative negative mg/dL   Bilirubin, UA negative negative   Ketones, POC UA negative negative mg/dL   Spec Grav, UA 1.020 1.010 - 1.025   Blood, UA negative negative   pH, UA 6.0 5.0 - 8.0   Protein Ur, POC negative negative mg/dL   Urobilinogen, UA 0.2 0.2 or 1.0 E.U./dL   Nitrite, UA Negative Negative   Leukocytes, UA Negative Negative   DG Chest 2 View CLINICAL DATA:  Preop for surgery on 04/29/2021.  Prostate cancer.  EXAM: CHEST - 2 VIEW  COMPARISON:  02/03/2016  FINDINGS: Midline trachea. Borderline cardiomegaly.  Mediastinal contours otherwise within normal limits. No pleural effusion or  pneumothorax. Clear lungs.  IMPRESSION: Borderline cardiomegaly, without acute disease.  Electronically Signed   By: Abigail Miyamoto M.D.   On: 04/22/2021 14:14   EKG - NSR rate 60, normal axis, intervals, no hypertrophy or acute ST/T changes, PAC.   Assessment & Plan:  This visit occurred during the SARS-CoV-2 public health emergency.  Safety protocols were in place, including screening questions prior to the visit, additional usage of staff PPE, and extensive cleaning of exam room while observing appropriate contact time as indicated for disinfecting solutions.   Problem List Items Addressed This Visit     Severe obesity (BMI 35.0-39.9) with comorbidity ([place])    20 lb weight gain since wife's passing.  Discussed relation of weight to joint pain and other chronic medical conditions, encouraged efforts towards weight loss.        Essential hypertension    BP elevated in office today, attributed to knee pain. BP better with cardiology later in the week. No changes - continue low dose amlodipine 5mg  daily.        CKD (chronic kidney disease) stage 3, GFR 30-59 ml/min ([facility])    Update kidney function.        Diet-controlled diabetes mellitus ([place])    Update A1c. Anticipate deterioration given noted weight gain.        Relevant Orders   Hemoglobin A1c (Completed)   Primary prostate adenocarcinoma ([facility])    S/p seed implant, PSA remains undetectable        Pre-op evaluation - Primary    Cardiac clearance through cardiology.  Anticipate adequately low risk to proceed with upcoming planned intervention.  Will forward results of labwork and imaging study to Dr Berenice Primas.  H/o OSA but currently not using CPAP.        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 Consider return.        OSA (obstructive sleep apnea)    H/o this, difficulty with mask in the past. Has not returned to pulm since 2019.          No orders of the defined types were placed in this encounter.  Orders Placed This Encounter  Procedures   DG Chest 2 View    Standing Status:   Future    Number of Occurrences:   1    Standing Expiration Date:   04/21/2022    Order Specific Question:   Reason for Exam (SYMPTOM  OR DIAGNOSIS REQUIRED)    Answer:   preop clearance    Order Specific Question:   Preferred imaging location?    Answer:   [facility]   Comprehensive metabolic panel   Hemoglobin A1c   CBC with Differential/Platelet   Protime-INR   POCT urinalysis dipstick   EKG 12-Lead    Patient Instructions  Send [place] info on covid vaccine to update your chart.  EKG today Labs today  Urinalysis today  Go to [facility] outpatient imaging for chest xray today.  We will forward results to Dr Berenice Primas.  Good luck with upcoming surgery!  Return in 2 months for  physical   Follow up plan: Return in about 2 months (around 06/22/2021), or if symptoms worsen or fail to improve, for annual exam, prior fasting for blood work.  Ria Bush, MD

## 2021-04-22 DIAGNOSIS — M1711 Unilateral primary osteoarthritis, right knee: Secondary | ICD-10-CM | POA: Diagnosis not present

## 2021-04-23 ENCOUNTER — Other Ambulatory Visit: Payer: Self-pay

## 2021-04-23 ENCOUNTER — Encounter: Payer: Self-pay | Admitting: Cardiology

## 2021-04-23 ENCOUNTER — Ambulatory Visit: Payer: PPO | Admitting: Cardiology

## 2021-04-23 VITALS — BP 134/72 | HR 78 | Ht 67.0 in | Wt 244.4 lb

## 2021-04-23 DIAGNOSIS — M1711 Unilateral primary osteoarthritis, right knee: Secondary | ICD-10-CM | POA: Diagnosis not present

## 2021-04-23 DIAGNOSIS — E7800 Pure hypercholesterolemia, unspecified: Secondary | ICD-10-CM

## 2021-04-23 DIAGNOSIS — Z01810 Encounter for preprocedural cardiovascular examination: Secondary | ICD-10-CM

## 2021-04-23 DIAGNOSIS — I10 Essential (primary) hypertension: Secondary | ICD-10-CM | POA: Diagnosis not present

## 2021-04-23 DIAGNOSIS — R0989 Other specified symptoms and signs involving the circulatory and respiratory systems: Secondary | ICD-10-CM | POA: Diagnosis not present

## 2021-04-23 NOTE — Assessment & Plan Note (Signed)
Update A1c. Anticipate deterioration given noted weight gain.

## 2021-04-23 NOTE — Patient Instructions (Signed)
  Testing/Procedures:  Your physician has requested that you have an abdominal aorta duplex. During this test, an ultrasound is used to evaluate the aorta. Allow 30 minutes for this exam. Do not eat after midnight the day before and avoid carbonated beverages [address]   Follow-Up: At [facility], you and your health needs are our priority.  As part of our continuing mission to provide you with exceptional heart care, we have created designated Provider Care Teams.  These Care Teams include your primary Cardiologist (physician) and Advanced Practice Providers (APPs -  Physician Assistants and Nurse Practitioners) who all work together to provide you with the care you need, when you need it.  We recommend signing up for the patient portal called "MyChart".  Sign up information is provided on this After Visit Summary.  MyChart is used to connect with patients for Virtual Visits (Telemedicine).  Patients are able to view lab/test results, encounter notes, upcoming appointments, etc.  Non-urgent messages can be sent to your provider as well.   To learn more about what you can do with MyChart, go to [email or web address].    Your next appointment:    AS NEEDED

## 2021-04-23 NOTE — Assessment & Plan Note (Signed)
H/o this, not on disease modifying agent, has not seen rheumatology in years. Consider return.

## 2021-04-23 NOTE — Telephone Encounter (Signed)
[facility] called to follow up on medical clearance and to also get labs from the appt

## 2021-04-23 NOTE — Assessment & Plan Note (Signed)
20 lb weight gain since wife's passing.  Discussed relation of weight to joint pain and other chronic medical conditions, encouraged efforts towards weight loss.

## 2021-04-23 NOTE — Assessment & Plan Note (Signed)
S/p seed implant, PSA remains undetectable

## 2021-04-23 NOTE — Assessment & Plan Note (Signed)
H/o this, difficulty with mask in the past. Has not returned to pulm since 2019.

## 2021-04-23 NOTE — Assessment & Plan Note (Addendum)
Cardiac clearance through cardiology.  Anticipate adequately low risk to proceed with upcoming planned intervention.  Will forward results of labwork and imaging study to Dr Berenice Primas.  H/o OSA but currently not using CPAP.

## 2021-04-23 NOTE — Assessment & Plan Note (Signed)
Update kidney function.  

## 2021-04-23 NOTE — Assessment & Plan Note (Addendum)
BP elevated in office today, attributed to knee pain. BP better with cardiology later in the week. No changes - continue low dose amlodipine 5mg  daily.

## 2021-04-27 NOTE — Telephone Encounter (Addendum)
[facility] faxed this to them on Friday.  Plz call to ensure they've received, otherwise may refax.

## 2021-04-28 NOTE — Telephone Encounter (Signed)
Spoke with [facility] asking about surgical clearance info.  Confirms they did receive it on 04/24/21.

## 2021-04-29 DIAGNOSIS — G8918 Other acute postprocedural pain: Secondary | ICD-10-CM | POA: Diagnosis not present

## 2021-04-29 DIAGNOSIS — M1711 Unilateral primary osteoarthritis, right knee: Secondary | ICD-10-CM | POA: Diagnosis not present

## 2021-04-29 DIAGNOSIS — Z96651 Presence of right artificial knee joint: Secondary | ICD-10-CM | POA: Diagnosis not present

## 2021-04-30 DIAGNOSIS — Z7982 Long term (current) use of aspirin: Secondary | ICD-10-CM | POA: Diagnosis not present

## 2021-04-30 DIAGNOSIS — E785 Hyperlipidemia, unspecified: Secondary | ICD-10-CM | POA: Diagnosis not present

## 2021-04-30 DIAGNOSIS — Z9181 History of falling: Secondary | ICD-10-CM | POA: Diagnosis not present

## 2021-04-30 DIAGNOSIS — I10 Essential (primary) hypertension: Secondary | ICD-10-CM | POA: Diagnosis not present

## 2021-04-30 DIAGNOSIS — K219 Gastro-esophageal reflux disease without esophagitis: Secondary | ICD-10-CM | POA: Diagnosis not present

## 2021-04-30 DIAGNOSIS — Z96651 Presence of right artificial knee joint: Secondary | ICD-10-CM | POA: Diagnosis not present

## 2021-04-30 DIAGNOSIS — Z471 Aftercare following joint replacement surgery: Secondary | ICD-10-CM | POA: Diagnosis not present

## 2021-05-12 DIAGNOSIS — Z9889 Other specified postprocedural states: Secondary | ICD-10-CM | POA: Diagnosis not present

## 2021-06-12 ENCOUNTER — Other Ambulatory Visit: Payer: Self-pay

## 2021-06-12 ENCOUNTER — Ambulatory Visit (HOSPITAL_COMMUNITY)
Admission: RE | Admit: 2021-06-12 | Discharge: 2021-06-12 | Disposition: A | Discharge status: Home / Self Care | Payer: PPO | Source: Ambulatory Visit | Attending: Cardiovascular Disease | Admitting: Cardiovascular Disease

## 2021-06-12 DIAGNOSIS — Z87891 Personal history of nicotine dependence: Secondary | ICD-10-CM | POA: Diagnosis not present

## 2021-06-12 DIAGNOSIS — R0989 Other specified symptoms and signs involving the circulatory and respiratory systems: Secondary | ICD-10-CM | POA: Insufficient documentation

## 2021-06-12 DIAGNOSIS — Z136 Encounter for screening for cardiovascular disorders: Secondary | ICD-10-CM | POA: Insufficient documentation

## 2021-07-16 DIAGNOSIS — Z9889 Other specified postprocedural states: Secondary | ICD-10-CM | POA: Diagnosis not present

## 2021-07-30 DIAGNOSIS — Z9889 Other specified postprocedural states: Secondary | ICD-10-CM | POA: Diagnosis not present

## 2021-08-27 DIAGNOSIS — M5441 Lumbago with sciatica, right side: Secondary | ICD-10-CM | POA: Diagnosis not present

## 2021-08-27 DIAGNOSIS — M25561 Pain in right knee: Secondary | ICD-10-CM | POA: Diagnosis not present

## 2021-08-30 IMAGING — CR DG CHEST 2V
2 series · 2 of 2 positions shown · non-contrast
Comparison: 02/03/2016

CLINICAL DATA: Preop for surgery on 04/29/2021.  Prostate cancer.

EXAM:
CHEST - 2 VIEW

[w chest pa]
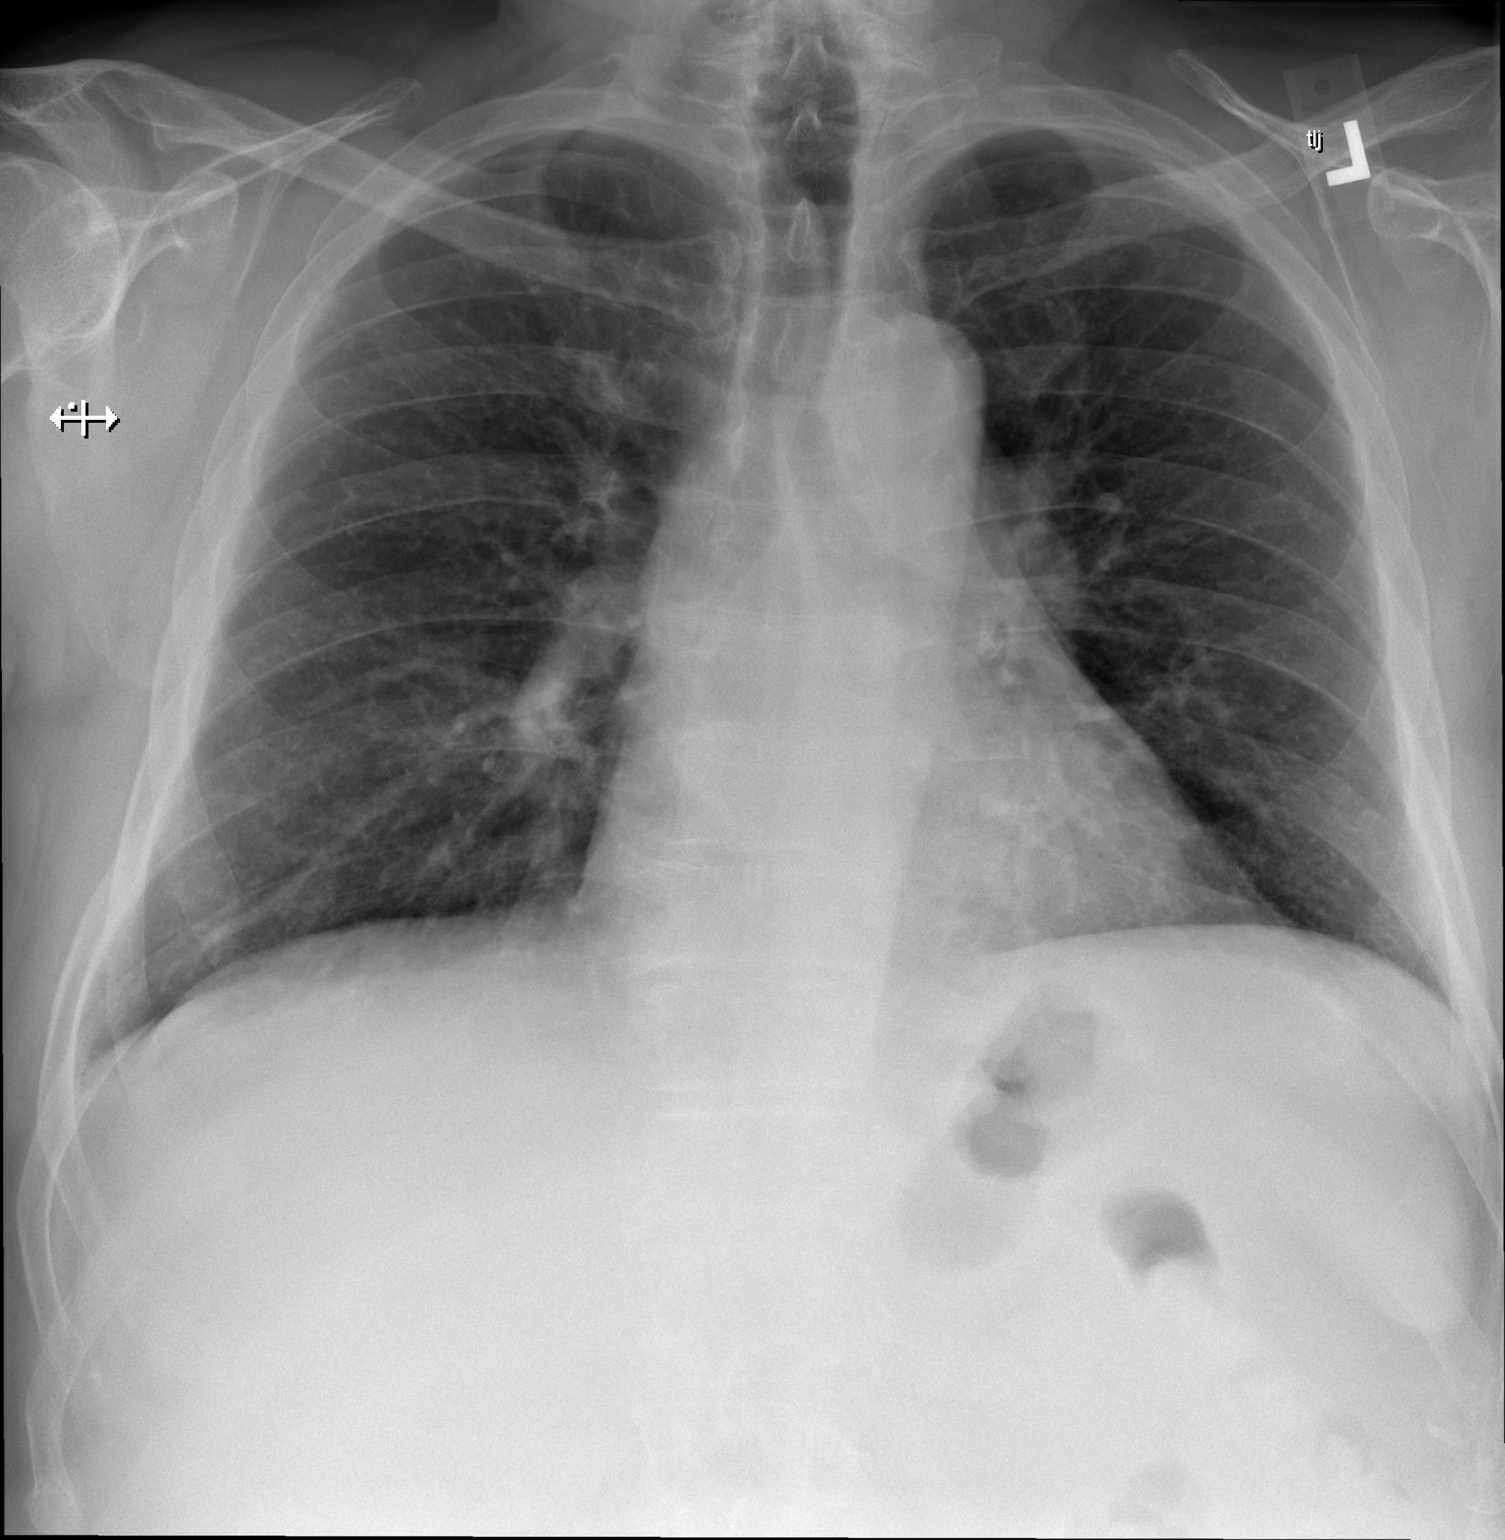

[w chest lat]
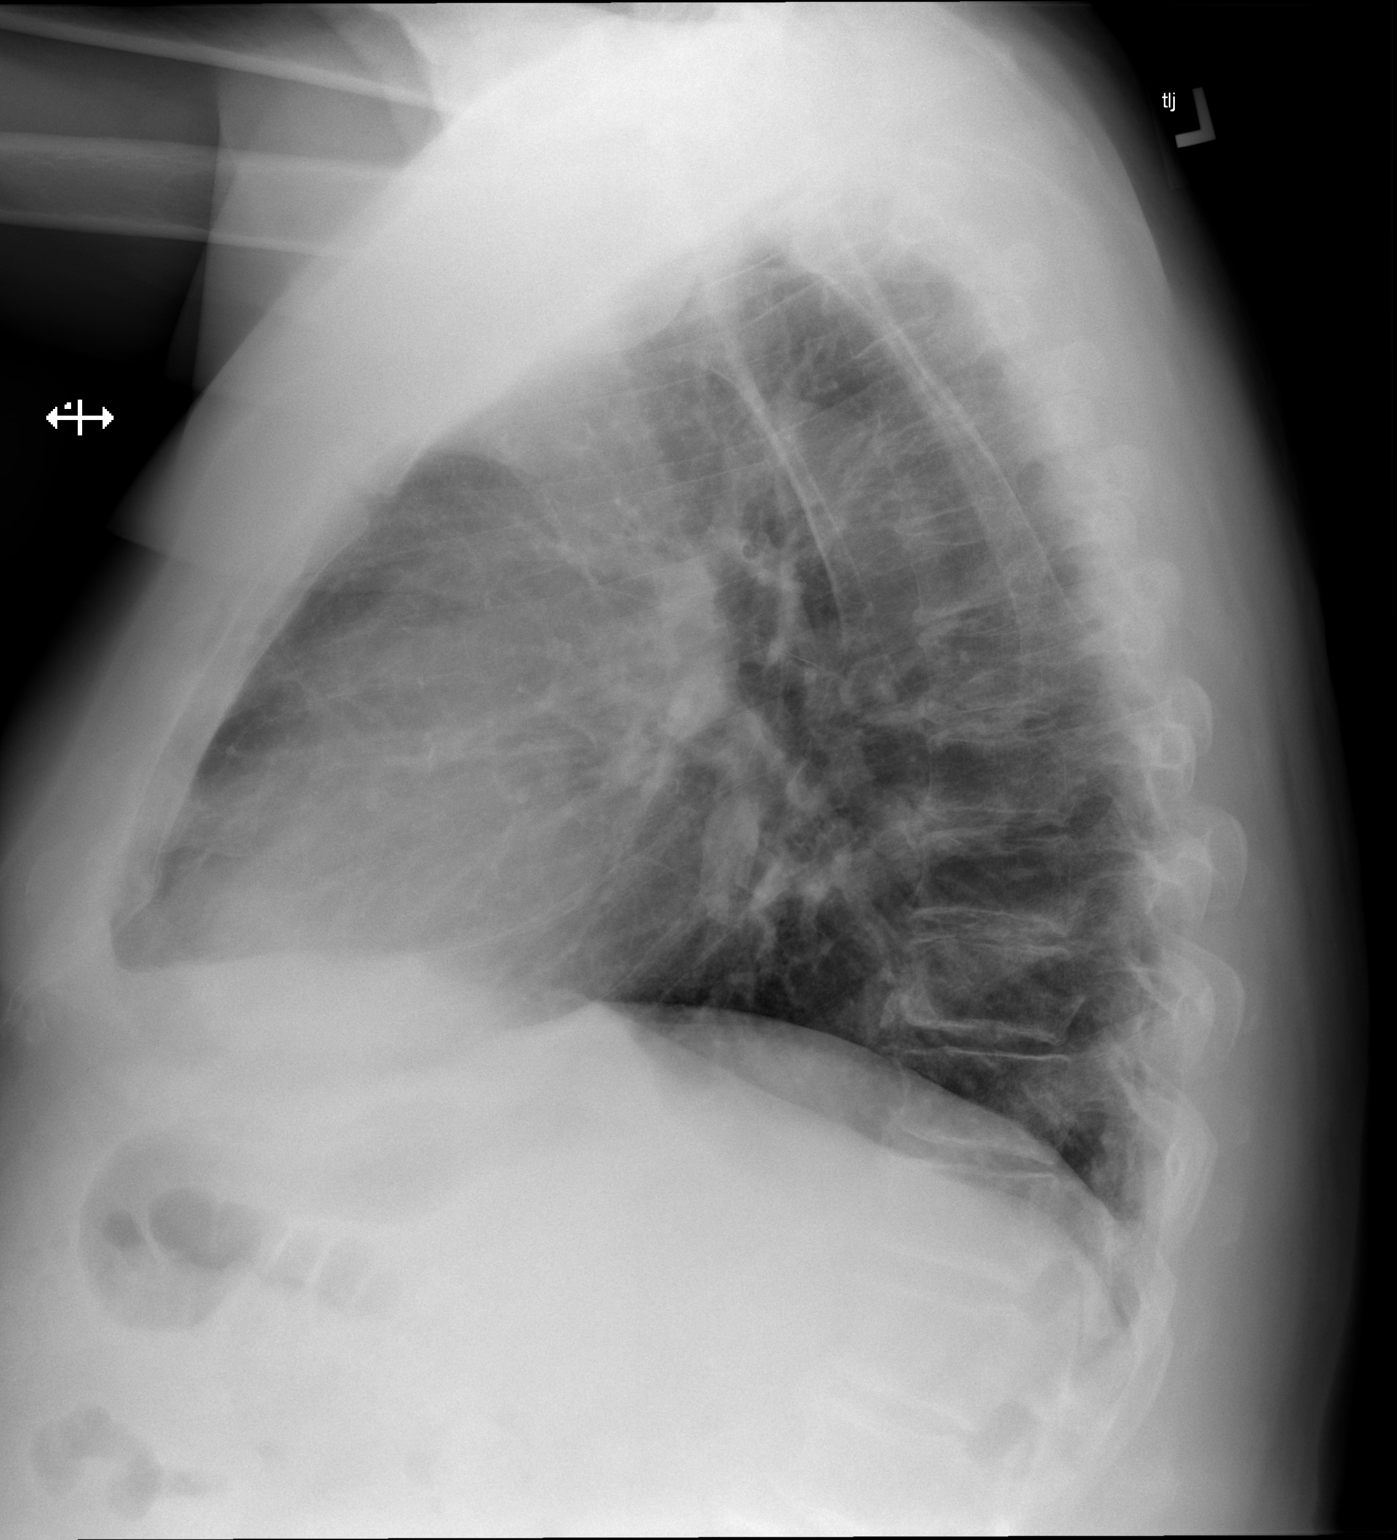

[2 of 2 positions shown; findings below may reference images not displayed]

FINDINGS: Midline trachea. Borderline cardiomegaly. Mediastinal contours
otherwise within normal limits. No pleural effusion or pneumothorax.
Clear lungs.
IMPRESSION: Borderline cardiomegaly, without acute disease.

## 2021-09-14 DIAGNOSIS — M545 Low back pain, unspecified: Secondary | ICD-10-CM | POA: Diagnosis not present

## 2021-09-21 DIAGNOSIS — M5441 Lumbago with sciatica, right side: Secondary | ICD-10-CM | POA: Diagnosis not present

## 2021-09-21 DIAGNOSIS — M79661 Pain in right lower leg: Secondary | ICD-10-CM | POA: Diagnosis not present

## 2021-10-02 DIAGNOSIS — M5416 Radiculopathy, lumbar region: Secondary | ICD-10-CM | POA: Diagnosis not present

## 2021-11-05 DIAGNOSIS — M5416 Radiculopathy, lumbar region: Secondary | ICD-10-CM | POA: Diagnosis not present

## 2021-11-09 DIAGNOSIS — M25561 Pain in right knee: Secondary | ICD-10-CM | POA: Diagnosis not present

## 2021-11-27 DIAGNOSIS — M5416 Radiculopathy, lumbar region: Secondary | ICD-10-CM | POA: Diagnosis not present

## 2021-12-04 ENCOUNTER — Other Ambulatory Visit: Payer: Self-pay

## 2021-12-11 NOTE — Progress Notes (Signed)
Subjective:   Kevin Campbell is a 76 y.o. male who presents for Medicare Annual/Subsequent preventive examination.  I connected with Sindy Messing today by telephone and verified that I am speaking with the correct person using two identifiers. Location patient: home Location provider: work Persons participating in the virtual visit: patient, Marine scientist.    I discussed the limitations, risks, security and privacy concerns of performing an evaluation and management service by telephone and the availability of in person appointments. I also discussed with the patient that there may be a patient responsible charge related to this service. The patient expressed understanding and verbally consented to this telephonic visit.    Interactive audio and video telecommunications were attempted between this provider and patient, however failed, due to patient having technical difficulties OR patient did not have access to video capability.  We continued and completed visit with audio only.  Some vital signs may be absent or patient reported.   Time Spent with patient on telephone encounter: 20 minutes  Review of Systems     Cardiac Risk Factors include: advanced age (>43men, >19 women);diabetes mellitus;hypertension;dyslipidemia     Objective:    Today's Vitals   12/14/21 0857  Weight: 250 lb (113.4 kg)  Height: 5\' 7"  (1.702 m)   Body mass index is 39.16 kg/m.  Advanced Directives 12/14/2021 06/03/2020 05/22/2018 05/10/2017 02/16/2013 09/26/2012 09/20/2012  Does Patient Have a Medical Advance Directive? No No No No Patient does not have advance directive Patient does not have advance directive;Patient would not like information Patient does not have advance directive  Would patient like information on creating a medical advance directive? Yes (MAU/Ambulatory/Procedural Areas - Information given) Yes (MAU/Ambulatory/Procedural Areas - Information given) Yes (MAU/Ambulatory/Procedural Areas -  Information given) Yes (MAU/Ambulatory/Procedural Areas - Information given) - - -  Pre-existing out of facility DNR order (yellow form or pink MOST form) - - - - No No No    Current Medications (verified) Outpatient Encounter Medications as of 12/14/2021  Medication Sig   aspirin EC 81 MG tablet Take 81 mg by mouth daily.   famotidine (PEPCID) 40 MG tablet Take 1 tablet (40 mg total) by mouth daily.   amLODipine (NORVASC) 5 MG tablet Take 1 tablet (5 mg total) by mouth daily. (Patient not taking: Reported on 12/14/2021)   atorvastatin (LIPITOR) 20 MG tablet Take 1 tablet (20 mg total) by mouth daily at 6 PM. (Patient not taking: Reported on 12/14/2021)   FLUZONE HIGH-DOSE QUADRIVALENT 0.7 ML SUSY    Facility-Administered Encounter Medications as of 12/14/2021  Medication   0.9 %  sodium chloride infusion    Allergies (verified) Benadryl [diphenhydramine hcl], Celebrex [celecoxib], Codeine, Flomax [tamsulosin hcl], and Hydrocodone   History: Past Medical History:  Diagnosis Date   Allergy    BPH (benign prostatic hypertrophy)    Carpal tunnel syndrome on both sides    Cataract 05/22/2018   bilateral eyes   Colon polyps 2011   [name]   GERD (gastroesophageal reflux disease)    watches diet   Hiatal hernia    Hypertension    Internal hemorrhoids    Nocturia    Obstructive sleep apnea    Overactive bladder    due to prostate seed implant radiation, failed myrbetriq, rapaflo, jalyn, now PTNS (McDiarmid)   Presbyesophagus    Prostate cancer ([place]) 08/24/2012   Adenocarcinoma,gleason=3+4=7,& 3+3=6,PSA=5.75,volume=23cc(5/12)cores positive; treated with XRT and radioactive seed implant, doing well ([place])   Rheumatoid arthritis ([place])    of hands by  Xray but RF, CCP, CRP negative (12/2013)   Seasonal allergies    Sleep apnea    pt did wear face mask with CPAP - but not tolerating mask now - not wearing C-PAP   Past Surgical History:  Procedure Laterality Date   CARDIOVASCULAR STRESS  TEST  11-26-2010   Normal lexiscan nuclear study with inferior thinning but no ischemia   CLAVICLE SURGERY Left 1968   REMOVAL OF BONE SPUR FROM PREVIOUS FX YEARS AGO   COLONOSCOPY  2011   1 polyp sigmoid (hyperplastic), diverticulosis, int hemorrhoids [facility] K[facility] at [facility])   KNEE ARTHROSCOPY Left JAN 2013   KNEE ARTHROSCOPY Right 12/2017   Dr. [facility]   PROSTATE BIOPSY  08/24/12   Adenocarcinoma   RADIOACTIVE SEED IMPLANT N/A 02/16/2013   Procedure: RADIOACTIVE PROSTATE SEED IMPLANT;  Surgeon: Claybon Jabs, MD   SHOULDER ARTHROSCOPY WITH BICEPS TENDON REPAIR Right APRIL 2013   TOTAL KNEE ARTHROPLASTY  09/26/2012   Procedure: TOTAL KNEE ARTHROPLASTY;  Surgeon: Sydnee Cabal, MD;  Location: [facility];  Service: Orthopedics;  Laterality: Left;   Family History  Problem Relation Age of Onset   Coronary artery disease Mother 72       3vCABG   Arthritis Mother    Rheumatologic disease Mother    CAD Mother    Cancer Father        lung, smoker   Cancer Sister        twin sister - breast   Stroke Neg Hx    Diabetes Neg Hx    Colon cancer Neg Hx    Rectal cancer Neg Hx    Stomach cancer Neg Hx    Social History   Socioeconomic History   Marital status: Widowed    Spouse name: Not on file   Number of children: Not on file   Years of education: Not on file   Highest education level: Not on file  Occupational History   Not on file  Tobacco Use   Smoking status: Former    Types: Cigars    Quit date: 10/11/1990    Years since quitting: 31.1   Smokeless tobacco: Never   Tobacco comments:    usually  chewed on cigars  Vaping Use   Vaping Use: Never used  Substance and Sexual Activity   Alcohol use: No   Drug use: No   Sexual activity: Not Currently  Other Topics Concern   Not on file  Social History Narrative   Caffeine: 2 cups coffee occasionally, soda throughout the day   Lives with wife, no pets.  2 grown children   Occupation: truck driver   Edu: HS    Activity: sedentary   Diet: rare water, good fruits and vegetables   Social Determinants of Radio broadcast assistant Strain: Low Risk    Difficulty of Paying Living Expenses: Not hard at all  Food Insecurity: No Food Insecurity   Worried About Charity fundraiser in the Last Year: Never true   Arboriculturist in the Last Year: Never true  Transportation Needs: No Transportation Needs   Lack of Transportation (Medical): No   Lack of Transportation (Non-Medical): No  Physical Activity: Inactive   Days of Exercise per Week: 0 days   Minutes of Exercise per Session: 0 min  Stress: No Stress Concern Present   Feeling of Stress : Only a little  Social Connections: Socially Isolated   Frequency of Communication with Friends and Family: More than  three times a week   Frequency of Social Gatherings with Friends and Family: More than three times a week   Attends Religious Services: Never   Marine scientist or Organizations: No   Attends Archivist Meetings: Never   Marital Status: Widowed    Tobacco Counseling Counseling given: Not Answered Tobacco comments: usually  chewed on cigars   Clinical Intake:  Pre-visit preparation completed: Yes  Pain : No/denies pain     BMI - recorded: 39.16 Nutritional Status: BMI > 30  Obese Nutritional Risks: None Diabetes: Yes CBG done?: No Did pt. bring in CBG monitor from home?: No  How often do you need to have someone help you when you read instructions, pamphlets, or other written materials from your doctor or pharmacy?: 1 - Never  Diabetes:  Is the patient diabetic?  Yes , type ll , diet controlled  If diabetic, was a CBG obtained today?  No  Did the patient bring in their glucometer from home?  No  How often do you monitor your CBG's? Patient states not currently checking.   Financial Strains and Diabetes Management:  Are you having any financial strains with the device, your supplies or your medication? No .   Does the patient want to be seen by Chronic Care Management for management of their diabetes?  No  Would the patient like to be referred to a Nutritionist or for Diabetic Management?  No   Diabetic Exams:  Diabetic Eye Exam: Overdue for diabetic eye exam. Pt plans to make an appointment.   Diabetic Foot Exam: Pt plans to make an appointment with PCP.   Interpreter Needed?: No  Information entered by :: Orrin Brigham LPN   Activities of Daily Living In your present state of health, do you have any difficulty performing the following activities: 12/14/2021  Hearing? N  Vision? N  Difficulty concentrating or making decisions? N  Walking or climbing stairs? Y  Dressing or bathing? N  Doing errands, shopping? N  Preparing Food and eating ? N  Using the Toilet? N  In the past six months, have you accidently leaked urine? Y  Comment at night  Do you have problems with loss of bowel control? N  Managing your Medications? N  Managing your Finances? N  Housekeeping or managing your Housekeeping? N  Some recent data might be hidden    Patient Care Team: Ria Bush, MD as PCP - General (Family Medicine)  Indicate any recent Medical Services you may have received from other than [facility] providers in the past year (date may be approximate).     Assessment:   This is a routine wellness examination for Gray.  Hearing/Vision screen Hearing Screening - Comments:: No issues Vision Screening - Comments:: Last exam over a year ago, Dr. Tommy Rainwater  Dietary issues and exercise activities discussed: Current Exercise Habits: The patient does not participate in regular exercise at present   Goals Addressed             This Visit's Progress    Patient Stated       Would like to start exercising         Depression Screen PHQ 2/9 Scores 12/14/2021 06/03/2020 05/30/2019 05/30/2019 05/22/2018 05/10/2017  PHQ - 2 Score 0 0 0 0 0 0  PHQ- 9 Score - 0 1 - 0 -    Fall Risk Fall Risk   12/14/2021 06/03/2020 05/30/2019 05/22/2018 05/11/2017  Falls in the past year? 0 0 0  Yes No  Comment - - - accidental fall off lawn mower; injury to right rib area -  Number falls in past yr: 0 0 - 1 -  Injury with Fall? 0 0 - Yes -  Risk for fall due to : - Medication side effect - - -  Follow up Falls prevention discussed Falls evaluation completed;Falls prevention discussed - - -    FALL RISK PREVENTION PERTAINING TO THE HOME:  Any stairs in or around the home? Yes  If so, are there any without handrails? No  Home free of loose throw rugs in walkways, pet beds, electrical cords, etc? Yes  Adequate lighting in your home to reduce risk of falls? Yes   ASSISTIVE DEVICES UTILIZED TO PREVENT FALLS:  Life alert? No  Use of a cane, walker or w/c? No  Grab bars in the bathroom? No  Shower chair or bench in shower? No  Elevated toilet seat or a handicapped toilet? No   TIMED UP AND GO:  Was the test performed? No .    Cognitive Function: Normal cognitive status assessed by this Nurse Health Advisor. No abnormalities found.   MMSE - Mini Mental State Exam 06/03/2020 05/22/2018 05/10/2017  Orientation to time 5 5 5   Orientation to Place 5 5 5   Registration 3 3 3   Attention/ Calculation 5 0 0  Recall 3 3 3   Language- name 2 objects - 0 0  Language- repeat 1 1 1   Language- follow 3 step command - 3 3  Language- read & follow direction - 0 0  Write a sentence - 0 0  Copy design - 0 0  Total score - 20 20        Immunizations Immunization History  Administered Date(s) Administered   Influenza Whole 06/22/2012   Influenza, High Dose Seasonal PF 08/01/2017   Influenza-Unspecified 07/08/2014, 07/12/2016   Pneumococcal Conjugate-13 05/22/2018   Pneumococcal Polysaccharide-23 10/18/2011, 05/30/2019   Td 10/17/2006    TDAP status: Due, Education has been provided regarding the importance of this vaccine. Advised may receive this vaccine at local pharmacy or Health Dept. Aware to  provide a copy of the vaccination record if obtained from local pharmacy or Health Dept. Verbalized acceptance and understanding.  Flu Vaccine status: Up to date  Pneumococcal vaccine status: Up to date  Covid-19 vaccine status: Information provided on how to obtain vaccines.   Qualifies for Shingles Vaccine? Yes   Zostavax completed No   Shingrix Completed?: No.    Education has been provided regarding the importance of this vaccine. Patient has been advised to call insurance company to determine out of pocket expense if they have not yet received this vaccine. Advised may also receive vaccine at local pharmacy or Health Dept. Verbalized acceptance and understanding.  Screening Tests Health Maintenance  Topic Date Due   COVID-19 Vaccine (1) Never done   FOOT EXAM  Never done   Zoster Vaccines- Shingrix (1 of 2) Never done   COLONOSCOPY (Pts 45-48yrs Insurance coverage will need to be confirmed)  04/20/2020   OPHTHALMOLOGY EXAM  08/11/2020   INFLUENZA VACCINE  05/11/2021   URINE MICROALBUMIN  06/04/2021   HEMOGLOBIN A1C  10/22/2021   TETANUS/TDAP  06/03/2024 (Originally 10/11/2018)   Pneumonia Vaccine 50+ Years old  Completed   Hepatitis C Screening  Completed   HPV VACCINES  Aged Out    Health Maintenance  Health Maintenance Due  Topic Date Due   COVID-19 Vaccine (1) Never done   FOOT  EXAM  Never done   Zoster Vaccines- Shingrix (1 of 2) Never done   COLONOSCOPY (Pts 45-57yrs Insurance coverage will need to be confirmed)  04/20/2020   OPHTHALMOLOGY EXAM  08/11/2020   INFLUENZA VACCINE  05/11/2021   URINE MICROALBUMIN  06/04/2021   HEMOGLOBIN A1C  10/22/2021    Colorectal cancer screening: Patient plans to discuss with PCP  Lung Cancer Screening: (Low Dose CT Chest recommended if Age 36-80 years, 30 pack-year currently smoking OR have quit w/in 15years.) does not qualify.     Additional Screening:  Hepatitis C Screening: does qualify; Completed 05/10/17  Vision  Screening: Recommended annual ophthalmology exams for early detection of glaucoma and other disorders of the eye. Is the patient up to date with their annual eye exam?  No  Who is the provider or what is the name of the office in which the patient attends annual eye exams? Dr. Tommy Rainwater   Dental Screening: Recommended annual dental exams for proper oral hygiene  Community Resource Referral / Chronic Care Management: CRR required this visit?  No   CCM required this visit?  No      Plan:     I have personally reviewed and noted the following in the patients chart:   Medical and social history Use of alcohol, tobacco or illicit drugs  Current medications and supplements including opioid prescriptions. Patient is not currently taking opioid prescriptions. Functional ability and status Nutritional status Physical activity Advanced directives List of other physicians Hospitalizations, surgeries, and ER visits in previous 12 months Vitals Screenings to include cognitive, depression, and falls Referrals and appointments  In addition, I have reviewed and discussed with patient certain preventive protocols, quality metrics, and best practice recommendations. A written personalized care plan for preventive services as well as general preventive health recommendations were provided to patient.   Due to this being a telephonic visit, the after visit summary with patients personalized plan was offered to patient via mail or my-chart.  Patient preferred to pick up at office at next visit.   Loma Messing, LPN   06/18/3531   Nurse Health Advisor  Nurse Notes: none

## 2021-12-14 ENCOUNTER — Ambulatory Visit (INDEPENDENT_AMBULATORY_CARE_PROVIDER_SITE_OTHER): Payer: PPO

## 2021-12-14 VITALS — Ht 67.0 in | Wt 250.0 lb

## 2021-12-14 DIAGNOSIS — Z0000 Encounter for general adult medical examination without abnormal findings: Secondary | ICD-10-CM

## 2021-12-14 NOTE — Patient Instructions (Signed)
Kevin Campbell , Thank you for taking time to complete your Medicare Wellness Visit. I appreciate your ongoing commitment to your health goals. Please review the following plan we discussed and let me know if I can assist you in the future.   Screening recommendations/referrals: Colonoscopy: due 04/20/20, last completed 04/20/10, per our conversation cologuard was done 2021, discuss with PCP Recommended yearly ophthalmology/optometry visit for glaucoma screening and checkup Recommended yearly dental visit for hygiene and checkup  Vaccinations: Influenza vaccine: up to date Pneumococcal vaccine: up to date  Tdap vaccine: due 10/17/16, Medicare might cover in the event that you are cut or injured  Shingles vaccine: Discuss with pharmacy   Covid-19: newest booster available at your local pharmacy  Advanced directives: information available at your next appointment  Conditions/risks identified: see problem list   Next appointment: Follow up in one year for your annual wellness visit.   Preventive Care 74 Years and Older, Male Preventive care refers to lifestyle choices and visits with your health care provider that can promote health and wellness. What does preventive care include? A yearly physical exam. This is also called an annual well check. Dental exams once or twice a year. Routine eye exams. Ask your health care provider how often you should have your eyes checked. Personal lifestyle choices, including: Daily care of your teeth and gums. Regular physical activity. Eating a healthy diet. Avoiding tobacco and drug use. Limiting alcohol use. Practicing safe sex. Taking low doses of aspirin every day. Taking vitamin and mineral supplements as recommended by your health care provider. What happens during an annual well check? The services and screenings done by your health care provider during your annual well check will depend on your age, overall health, lifestyle risk factors, and  family history of disease. Counseling  Your health care provider may ask you questions about your: Alcohol use. Tobacco use. Drug use. Emotional well-being. Home and relationship well-being. Sexual activity. Eating habits. History of falls. Memory and ability to understand (cognition). Work and work Statistician. Screening  You may have the following tests or measurements: Height, weight, and BMI. Blood pressure. Lipid and cholesterol levels. These may be checked every 5 years, or more frequently if you are over 69 years old. Skin check. Lung cancer screening. You may have this screening every year starting at age 74 if you have a 30-pack-year history of smoking and currently smoke or have quit within the past 15 years. Fecal occult blood test (FOBT) of the stool. You may have this test every year starting at age 74. Flexible sigmoidoscopy or colonoscopy. You may have a sigmoidoscopy every 5 years or a colonoscopy every 10 years starting at age 22. Prostate cancer screening. Recommendations will vary depending on your family history and other risks. Hepatitis C blood test. Hepatitis B blood test. Sexually transmitted disease (STD) testing. Diabetes screening. This is done by checking your blood sugar (glucose) after you have not eaten for a while (fasting). You may have this done every 1-3 years. Abdominal aortic aneurysm (AAA) screening. You may need this if you are a current or former smoker. Osteoporosis. You may be screened starting at age 74 if you are at high risk. Talk with your health care provider about your test results, treatment options, and if necessary, the need for more tests. Vaccines  Your health care provider may recommend certain vaccines, such as: Influenza vaccine. This is recommended every year. Tetanus, diphtheria, and acellular pertussis (Tdap, Td) vaccine. You may need a Td  booster every 10 years. Zoster vaccine. You may need this after age 74. Pneumococcal  13-valent conjugate (PCV13) vaccine. One dose is recommended after age 74. Pneumococcal polysaccharide (PPSV23) vaccine. One dose is recommended after age 74. Talk to your health care provider about which screenings and vaccines you need and how often you need them. This information is not intended to replace advice given to you by your health care provider. Make sure you discuss any questions you have with your health care provider. Document Released: 10/24/2015 Document Revised: 06/16/2016 Document Reviewed: 07/29/2015 Elsevier Interactive Patient Education  2017 [employer] Prevention in the Home Falls can cause injuries. They can happen to people of all ages. There are many things you can do to make your home safe and to help prevent falls. What can I do on the outside of my home? Regularly fix the edges of walkways and driveways and fix any cracks. Remove anything that might make you trip as you walk through a door, such as a raised step or threshold. Trim any bushes or trees on the path to your home. Use bright outdoor lighting. Clear any walking paths of anything that might make someone trip, such as rocks or tools. Regularly check to see if handrails are loose or broken. Make sure that both sides of any steps have handrails. Any raised decks and porches should have guardrails on the edges. Have any leaves, snow, or ice cleared regularly. Use sand or salt on walking paths during winter. Clean up any spills in your garage right away. This includes oil or grease spills. What can I do in the bathroom? Use night lights. Install grab bars by the toilet and in the tub and shower. Do not use towel bars as grab bars. Use non-skid mats or decals in the tub or shower. If you need to sit down in the shower, use a plastic, non-slip stool. Keep the floor dry. Clean up any water that spills on the floor as soon as it happens. Remove soap buildup in the tub or shower regularly. Attach  bath mats securely with double-sided non-slip rug tape. Do not have throw rugs and other things on the floor that can make you trip. What can I do in the bedroom? Use night lights. Make sure that you have a light by your bed that is easy to reach. Do not use any sheets or blankets that are too big for your bed. They should not hang down onto the floor. Have a firm chair that has side arms. You can use this for support while you get dressed. Do not have throw rugs and other things on the floor that can make you trip. What can I do in the kitchen? Clean up any spills right away. Avoid walking on wet floors. Keep items that you use a lot in easy-to-reach places. If you need to reach something above you, use a strong step stool that has a grab bar. Keep electrical cords out of the way. Do not use floor polish or wax that makes floors slippery. If you must use wax, use non-skid floor wax. Do not have throw rugs and other things on the floor that can make you trip. What can I do with my stairs? Do not leave any items on the stairs. Make sure that there are handrails on both sides of the stairs and use them. Fix handrails that are broken or loose. Make sure that handrails are as long as the stairways. Check any carpeting  to make sure that it is firmly attached to the stairs. Fix any carpet that is loose or worn. Avoid having throw rugs at the top or bottom of the stairs. If you do have throw rugs, attach them to the floor with carpet tape. Make sure that you have a light switch at the top of the stairs and the bottom of the stairs. If you do not have them, ask someone to add them for you. What else can I do to help prevent falls? Wear shoes that: Do not have high heels. Have rubber bottoms. Are comfortable and fit you well. Are closed at the toe. Do not wear sandals. If you use a stepladder: Make sure that it is fully opened. Do not climb a closed stepladder. Make sure that both sides of the  stepladder are locked into place. Ask someone to hold it for you, if possible. Clearly mark and make sure that you can see: Any grab bars or handrails. First and last steps. Where the edge of each step is. Use tools that help you move around (mobility aids) if they are needed. These include: Canes. Walkers. Scooters. Crutches. Turn on the lights when you go into a dark area. Replace any light bulbs as soon as they burn out. Set up your furniture so you have a clear path. Avoid moving your furniture around. If any of your floors are uneven, fix them. If there are any pets around you, be aware of where they are. Review your medicines with your doctor. Some medicines can make you feel dizzy. This can increase your chance of falling. Ask your doctor what other things that you can do to help prevent falls. This information is not intended to replace advice given to you by your health care provider. Make sure you discuss any questions you have with your health care provider. Document Released: 07/24/2009 Document Revised: 03/04/2016 Document Reviewed: 11/01/2014 Elsevier Interactive Patient Education  2017 [employer].

## 2021-12-18 ENCOUNTER — Telehealth: Payer: Self-pay

## 2021-12-18 MED ORDER — AMLODIPINE BESYLATE 5 MG PO TABS: 5.0000 mg | ORAL_TABLET | Freq: Every day | ORAL | 0 refills | Status: DC

## 2021-12-18 NOTE — Telephone Encounter (Signed)
Patient called office states that he thinks that his blood pressure is making him have increased fatigue. His blood pressure have been 140/78. That is elevated states it normally is in the 120 range. He states that he has been out of him amlodipine for a few months. Patient declined appointment with another provider for evaluation on bp. Please advise if ok to put in next available or if something different  can be done for patient.  ?

## 2021-12-18 NOTE — Addendum Note (Signed)
Addended by: Brenton Grills on: 7/94/8016 55:37 PM ? ? Modules accepted: Orders ? ?

## 2021-12-18 NOTE — Telephone Encounter (Signed)
Looks like pt had wellness on 12/14/21.  Ok to have pt added at a 12:30 slot next week? In the meantime, I sent a refill for amlodipine.  ?

## 2021-12-21 ENCOUNTER — Encounter: Payer: Self-pay | Admitting: Family Medicine

## 2021-12-21 ENCOUNTER — Other Ambulatory Visit: Payer: Self-pay

## 2021-12-21 ENCOUNTER — Ambulatory Visit (INDEPENDENT_AMBULATORY_CARE_PROVIDER_SITE_OTHER): Payer: PPO | Admitting: Family Medicine

## 2021-12-21 VITALS — BP 142/92 | HR 94 | Temp 97.3°F | Resp 16 | Ht 66.0 in | Wt 254.0 lb

## 2021-12-21 DIAGNOSIS — K219 Gastro-esophageal reflux disease without esophagitis: Secondary | ICD-10-CM | POA: Diagnosis not present

## 2021-12-21 DIAGNOSIS — M06042 Rheumatoid arthritis without rheumatoid factor, left hand: Secondary | ICD-10-CM

## 2021-12-21 DIAGNOSIS — E119 Type 2 diabetes mellitus without complications: Secondary | ICD-10-CM

## 2021-12-21 DIAGNOSIS — E785 Hyperlipidemia, unspecified: Secondary | ICD-10-CM

## 2021-12-21 DIAGNOSIS — R1319 Other dysphagia: Secondary | ICD-10-CM | POA: Diagnosis not present

## 2021-12-21 DIAGNOSIS — I10 Essential (primary) hypertension: Secondary | ICD-10-CM | POA: Diagnosis not present

## 2021-12-21 DIAGNOSIS — M5416 Radiculopathy, lumbar region: Secondary | ICD-10-CM

## 2021-12-21 DIAGNOSIS — N183 Chronic kidney disease, stage 3 unspecified: Secondary | ICD-10-CM | POA: Diagnosis not present

## 2021-12-21 DIAGNOSIS — N401 Enlarged prostate with lower urinary tract symptoms: Secondary | ICD-10-CM | POA: Diagnosis not present

## 2021-12-21 DIAGNOSIS — E1169 Type 2 diabetes mellitus with other specified complication: Secondary | ICD-10-CM

## 2021-12-21 DIAGNOSIS — M06041 Rheumatoid arthritis without rheumatoid factor, right hand: Secondary | ICD-10-CM | POA: Diagnosis not present

## 2021-12-21 DIAGNOSIS — R351 Nocturia: Secondary | ICD-10-CM

## 2021-12-21 DIAGNOSIS — G4733 Obstructive sleep apnea (adult) (pediatric): Secondary | ICD-10-CM

## 2021-12-21 DIAGNOSIS — Z0000 Encounter for general adult medical examination without abnormal findings: Secondary | ICD-10-CM

## 2021-12-21 DIAGNOSIS — Z7189 Other specified counseling: Secondary | ICD-10-CM

## 2021-12-21 DIAGNOSIS — C61 Malignant neoplasm of prostate: Secondary | ICD-10-CM | POA: Diagnosis not present

## 2021-12-21 DIAGNOSIS — R5383 Other fatigue: Secondary | ICD-10-CM

## 2021-12-21 LAB — POCT GLYCOSYLATED HEMOGLOBIN (HGB A1C): Hemoglobin A1C: 6.7 % — AB (ref 4.0–5.6)

## 2021-12-21 MED ORDER — ATORVASTATIN CALCIUM 20 MG PO TABS: 20.0000 mg | ORAL_TABLET | Freq: Every day | ORAL | 3 refills | Status: DC

## 2021-12-21 MED ORDER — AMLODIPINE BESYLATE 5 MG PO TABS: 5.0000 mg | ORAL_TABLET | Freq: Every day | ORAL | 3 refills | Status: DC

## 2021-12-21 MED ORDER — TRAMADOL HCL 50 MG PO TABS: 25.0000 mg | ORAL_TABLET | Freq: Two times a day (BID) | ORAL | 0 refills | Status: AC | PRN

## 2021-12-21 MED ORDER — FAMOTIDINE 20 MG PO TABS: 20.0000 mg | ORAL_TABLET | Freq: Every day | ORAL | 3 refills | Status: DC

## 2021-12-21 NOTE — Assessment & Plan Note (Addendum)
Notes symptoms recurring.  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  ?Reassuring EGD 09/2020 s/p empiric esophageal dilation with benefit.  ?rec trial of pepcid '20mg'$  nightly.   ?

## 2021-12-21 NOTE — Assessment & Plan Note (Signed)
Chronic, update kidney function when he returns for fasting labs. ?

## 2021-12-21 NOTE — Assessment & Plan Note (Addendum)
Chronic, deteriorated since off amlodipine - will restart and monitor effect. Consider ACEI/ARB in place of amlodipine.  ?

## 2021-12-21 NOTE — Assessment & Plan Note (Signed)
Notes ongoing fatigue over last 3-4 months. Check labs for reversible causes.  ?

## 2021-12-21 NOTE — Assessment & Plan Note (Signed)
Preventative protocols reviewed and updated unless pt declined. Discussed healthy diet and lifestyle.  

## 2021-12-21 NOTE — Patient Instructions (Addendum)
For pain - try tramadol 1/2-1 tablet as needed.  ?Continue seeing back doctors.  ?A1c check today.  ?Schedule lab visit for fasting blood work one morning this week.  ?If interested, check with pharmacy about new 2 shot shingles series (shingrix).  ?Schedule diabetic eye exam.  ?Bring [place] a copy of your updated living will.  ?Take pepcid '20mg'$  nightly for swallowing difficulty. If ongoing, let us know for return to GI doctor.  ?Good to see you today ?Return as needed or in 6 months for diabetes follow up visit.  ? ?Health Maintenance After Age 31 ?After age 73, you are at a higher risk for certain long-term diseases and infections as well as injuries from falls. Falls are a major cause of broken bones and head injuries in people who are older than age 71. Getting regular preventive care can help to keep you healthy and well. Preventive care includes getting regular testing and making lifestyle changes as recommended by your health care provider. Talk with your health care provider about: ?Which screenings and tests you should have. A screening is a test that checks for a disease when you have no symptoms. ?A diet and exercise plan that is right for you. ?What should I know about screenings and tests to prevent falls? ?Screening and testing are the best ways to find a health problem early. Early diagnosis and treatment give you the best chance of managing medical conditions that are common after age 21. Certain conditions and lifestyle choices may make you more likely to have a fall. Your health care provider may recommend: ?Regular vision checks. Poor vision and conditions such as cataracts can make you more likely to have a fall. If you wear glasses, make sure to get your prescription updated if your vision changes. ?Medicine review. Work with your health care provider to regularly review all of the medicines you are taking, including over-the-counter medicines. Ask your health care provider about any side effects  that may make you more likely to have a fall. Tell your health care provider if any medicines that you take make you feel dizzy or sleepy. ?Strength and balance checks. Your health care provider may recommend certain tests to check your strength and balance while standing, walking, or changing positions. ?Foot health exam. Foot pain and numbness, as well as not wearing proper footwear, can make you more likely to have a fall. ?Screenings, including: ?Osteoporosis screening. Osteoporosis is a condition that causes the bones to get weaker and break more easily. ?Blood pressure screening. Blood pressure changes and medicines to control blood pressure can make you feel dizzy. ?Depression screening. You may be more likely to have a fall if you have a fear of falling, feel depressed, or feel unable to do activities that you used to do. ?Alcohol use screening. Using too much alcohol can affect your balance and may make you more likely to have a fall. ?Follow these instructions at home: ?Lifestyle ?Do not drink alcohol if: ?Your health care provider tells you not to drink. ?If you drink alcohol: ?Limit how much you have to: ?0-1 drink a day for women. ?0-2 drinks a day for men. ?Know how much alcohol is in your drink. In the [place] one drink equals one 12 oz bottle of beer (355 mL), one 5 oz glass of wine (148 mL), or one 1? oz glass of hard liquor (44 mL). ?Do not use any products that contain nicotine or tobacco. These products include cigarettes, chewing tobacco, and vaping devices,  such as e-cigarettes. If you need help quitting, ask your health care provider. ?Activity ? ?Follow a regular exercise program to stay fit. This will help you maintain your balance. Ask your health care provider what types of exercise are appropriate for you. ?If you need a cane or walker, use it as recommended by your health care provider. ?Wear supportive shoes that have nonskid soles. ?Safety ? ?Remove any tripping hazards, such as rugs,  cords, and clutter. ?Install safety equipment such as grab bars in bathrooms and safety rails on stairs. ?Keep rooms and walkways well-lit. ?General instructions ?Talk with your health care provider about your risks for falling. Tell your health care provider if: ?You fall. Be sure to tell your health care provider about all falls, even ones that seem minor. ?You feel dizzy, tiredness (fatigue), or off-balance. ?Take over-the-counter and prescription medicines only as told by your health care provider. These include supplements. ?Eat a healthy diet and maintain a healthy weight. A healthy diet includes low-fat dairy products, low-fat (lean) meats, and fiber from whole grains, beans, and lots of fruits and vegetables. ?Stay current with your vaccines. ?Schedule regular health, dental, and eye exams. ?Summary ?Having a healthy lifestyle and getting preventive care can help to protect your health and wellness after age 3. ?Screening and testing are the best way to find a health problem early and help you avoid having a fall. Early diagnosis and treatment give you the best chance for managing medical conditions that are more common for people who are older than age 62. ?Falls are a major cause of broken bones and head injuries in people who are older than age 56. Take precautions to prevent a fall at home. ?Work with your health care provider to learn what changes you can make to improve your health and wellness and to prevent falls. ?This information is not intended to replace advice given to you by your health care provider. Make sure you discuss any questions you have with your health care provider. ?Document Revised: 02/16/2021 Document Reviewed: 02/16/2021 ?Elsevier Patient Education ? [place]. ? ?

## 2021-12-21 NOTE — Assessment & Plan Note (Addendum)
Chronic, has been off medication for the past few months. Will check FLP and restart atorvastatin.  ?The 10-year ASCVD risk score (Arnett DK, et al., 2019) is: 56.2% ?  Values used to calculate the score: ?    Age: 76 years ?    Sex: Male ?    Is Non-Hispanic African American: No ?    Diabetic: Yes ?    Tobacco smoker: No ?    Systolic Blood Pressure: 103 mmHg ?    Is BP treated: Yes ?    HDL Cholesterol: 25.8 mg/dL ?    Total Cholesterol: 172 mg/dL  ?

## 2021-12-21 NOTE — Assessment & Plan Note (Addendum)
No GERD symptoms but notes dysphagia returning - rec start pepcid '20mg'$  nightly, update if ongoing dysphagia.  ?S/p reassuring EGD 09/2020 with empiric dilation (Danis).  ?

## 2021-12-21 NOTE — Assessment & Plan Note (Signed)
Previously had trouble with mask use - has not recently seen pulm.  ?

## 2021-12-21 NOTE — Assessment & Plan Note (Signed)
S/p ESI earlier this year Kevin Campbell). Notes ongoing R leg pain, not controlled with gabapentin trial or OTC NSAIDs. H/o intolerance to stronger narcotics. Will trial tramadol '50mg'$  1/2-1 tab BID PRN #20 - update with effect.  ?

## 2021-12-21 NOTE — Progress Notes (Signed)
Patient ID: Kevin Campbell, male    DOB: [birth year], 76 y.o.   MRN: [id number]  This visit was conducted in person.  BP (!) 142/92    Pulse 94    Temp (!) 97.3 F (36.3 C)    Resp 16    Ht '5\' 6"'  (1.676 m)    Wt 254 lb (115.2 kg)    SpO2 99%    BMI 41.00 kg/m    CC: CPE Subjective:   HPI: Kevin Campbell is a 76 y.o. male presenting on 12/21/2021 for Annual Exam   Saw health advisor last week for medicare wellness visit. Note reviewed.    No results found.  Flowsheet Row Clinical Support from 12/14/2021 in [facility] at [place]  PHQ-2 Total Score 0       Fall Risk  12/14/2021 06/03/2020 05/30/2019 05/22/2018 05/11/2017  Falls in the past year? 0 0 0 Yes No  Comment - - - accidental fall off lawn mower; injury to right rib area -  Number falls in past yr: 0 0 - 1 -  Injury with Fall? 0 0 - Yes -  Risk for fall due to : - Medication side effect - - -  Follow up Falls prevention discussed Falls evaluation completed;Falls prevention discussed - - -    Wife passed away 08/15/2020 - he was her caregiver. Daughter lives next door.  Weight gain noted in interim. 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 Notes ongoing R leg pain, numbness, s/p lumbar ESI Jacelyn Grip). Gabapentin didn't really help pain, aleve doesn't help pain.   Notes intermittent dysphagia, s/p EGD with dilation with benefit.   He's been off all meds for the past 3 months - ran out. Feeling fatigued for the past 3-4 months.   DM - not checking sugars. Not limiting sugar intake.   Not fasting today - had bread and bacon.    H/o RA - manages with OTC cool gel with benefit.   Preventative: Colonoscopy 2011 - 1 HP, diverticulosis ([facility]). Cologard negative 06/2020.  H/o prostate cancer 2013 s/p radioactive seed implant (MacDiarmid) last seen 2018.  Lung cancer screening - not eligible  Flu shot yearly - missed this year  COVID vaccine - got Pfizer x1 Pneumovax 2013, prevnar-13 2019, pneumovax 05/2019 Td 2008   Shingrix - discussed, to check at insurance  Advanced directive - has completed. HCPOA would be daughter [name]. Asked to bring [place] a copy when completed.  Seat belt use discussed.  Sunscreen use discussed. No changing moles on skin.  Ex smoker - quit 1992  Alcohol - none  Dentist - has not seen recently - has upper plate, no bottom teeth  Eye exam - sees yearly s/p cataract surgery fall 2020 ([facility])  Bowel - no constipation  Bladder - h/o incontinence completed PTNS without benefit. Nocturia x2-3.   Caffeine: 2 cups coffee occasionally, soda throughout the day Lives with wife, no pets.  2 grown children Occupation: truck driver Edu: HS Activity: sedentary, some yardwork  Diet: some water with yardwork, good fruits and vegetables     Relevant past medical, surgical, family and social history reviewed and updated as indicated. Interim medical history since our last visit reviewed. Allergies and medications reviewed and updated. Outpatient Medications Prior to Visit  Medication Sig Dispense Refill   aspirin EC 81 MG tablet Take 81 mg by mouth daily.     amLODipine (NORVASC) 5 MG tablet Take 1 tablet (5 mg total) by mouth  daily. 90 tablet 0   atorvastatin (LIPITOR) 20 MG tablet Take 1 tablet (20 mg total) by mouth daily at 6 PM. (Patient not taking: Reported on 12/14/2021) 90 tablet 3   famotidine (PEPCID) 40 MG tablet Take 1 tablet (40 mg total) by mouth daily. 30 tablet 1   FLUZONE HIGH-DOSE QUADRIVALENT 0.7 ML SUSY      Facility-Administered Medications Prior to Visit  Medication Dose Route Frequency Provider Last Rate Last Admin   0.9 %  sodium chloride infusion  500 mL Intravenous Once Nelida Meuse III, MD         Per HPI unless specifically indicated in ROS section below Review of Systems  Constitutional:  Negative for activity change, appetite change, chills, fatigue, fever and unexpected weight change.  HENT:  Negative for hearing loss.   Eyes:  Negative for  visual disturbance.  Respiratory:  Negative for cough, chest tightness, shortness of breath and wheezing.   Cardiovascular:  Negative for chest pain, palpitations and leg swelling.  Gastrointestinal:  Negative for abdominal distention, abdominal pain, blood in stool, constipation, diarrhea, nausea and vomiting.  Genitourinary:  Negative for difficulty urinating and hematuria.  Musculoskeletal:  Negative for arthralgias, myalgias and neck pain.  Skin:  Negative for rash.  Neurological:  Positive for headaches. Negative for dizziness, seizures and syncope.  Hematological:  Negative for adenopathy. Does not bruise/bleed easily.  Psychiatric/Behavioral:  Negative for dysphoric mood. The patient is not nervous/anxious.    Objective:  BP (!) 142/92    Pulse 94    Temp (!) 97.3 F (36.3 C)    Resp 16    Ht '5\' 6"'  (1.676 m)    Wt 254 lb (115.2 kg)    SpO2 99%    BMI 41.00 kg/m   Wt Readings from Last 3 Encounters:  12/21/21 254 lb (115.2 kg)  12/14/21 250 lb (113.4 kg)  04/23/21 244 lb 6.4 oz (110.9 kg)      Physical Exam Vitals and nursing note reviewed.  Constitutional:      General: He is not in acute distress.    Appearance: Normal appearance. He is well-developed. He is not ill-appearing.  HENT:     Head: Normocephalic and atraumatic.     Right Ear: Hearing, tympanic membrane, ear canal and external ear normal.     Left Ear: Hearing, tympanic membrane, ear canal and external ear normal.  Eyes:     General: No scleral icterus.    Extraocular Movements: Extraocular movements intact.     Conjunctiva/sclera: Conjunctivae normal.     Pupils: Pupils are equal, round, and reactive to light.  Neck:     Thyroid: No thyroid mass or thyromegaly.     Vascular: No carotid bruit.  Cardiovascular:     Rate and Rhythm: Normal rate and regular rhythm.     Pulses: Normal pulses.          Radial pulses are 2+ on the right side and 2+ on the left side.     Heart sounds: Normal heart sounds. No  murmur heard. Pulmonary:     Effort: Pulmonary effort is normal. No respiratory distress.     Breath sounds: Normal breath sounds. No wheezing, rhonchi or rales.  Abdominal:     General: Bowel sounds are normal. There is no distension.     Palpations: Abdomen is soft. There is no mass.     Tenderness: There is no abdominal tenderness. There is no guarding or rebound.  Hernia: No hernia is present.  Musculoskeletal:        General: Normal range of motion.     Cervical back: Normal range of motion and neck supple.     Right lower leg: No edema.     Left lower leg: No edema.  Lymphadenopathy:     Cervical: No cervical adenopathy.  Skin:    General: Skin is warm and dry.     Findings: No rash.  Neurological:     General: No focal deficit present.     Mental Status: He is alert and oriented to person, place, and time.  Psychiatric:        Mood and Affect: Mood normal.        Behavior: Behavior normal.        Thought Content: Thought content normal.        Judgment: Judgment normal.      Results for orders placed or performed in visit on 12/21/21  HgB A1c  Result Value Ref Range   Hemoglobin A1C 6.7 (A) 4.0 - 5.6 %   HbA1c POC (<> result, manual entry)     HbA1c, POC (prediabetic range)     HbA1c, POC (controlled diabetic range)      Assessment & Plan:  This visit occurred during the SARS-CoV-2 public health emergency.  Safety protocols were in place, including screening questions prior to the visit, additional usage of staff PPE, and extensive cleaning of exam room while observing appropriate contact time as indicated for disinfecting solutions.   Problem List Items Addressed This Visit     Healthcare maintenance - Primary (Chronic)    Preventative protocols reviewed and updated unless pt declined. Discussed healthy diet and lifestyle.       Advanced care planning/counseling discussion (Chronic)    Advanced directive - has completed. HCPOA would be daughter [name]. Asked to bring [place] a copy when completed.       Dyslipidemia associated with type 2 diabetes mellitus (HCC)    Chronic, has been off medication for the past few months. Will check FLP and restart atorvastatin.  The 10-year ASCVD risk score (Arnett DK, et al., 2019) is: 56.2%   Values used to calculate the score:     Age: 62 years     Sex: Male     Is Non-Hispanic African American: No     Diabetic: Yes     Tobacco smoker: No     Systolic Blood Pressure: 161 mmHg     Is BP treated: Yes     HDL Cholesterol: 25.8 mg/dL     Total Cholesterol: 172 mg/dL       Relevant Medications   atorvastatin (LIPITOR) 20 MG tablet   Other Relevant Orders   Lipid panel   Comprehensive metabolic panel   Obesity, morbid, BMI 40.0-49.9 (HCC)    Chronic, deteriorated. Discussed weight gain noted. Encouraged healthy diet and lifestyle choices to affect sustainable weight loss.       Essential hypertension    Chronic, deteriorated since off amlodipine - will restart and monitor effect. Consider ACEI/ARB in place of amlodipine.       Relevant Medications   amLODipine (NORVASC) 5 MG tablet   atorvastatin (LIPITOR) 20 MG tablet   GERD    No GERD symptoms but notes dysphagia returning - rec start pepcid 40m nightly, update if ongoing dysphagia.  S/p reassuring EGD 09/2020 with empiric dilation (Danis).       Relevant Medications   famotidine (PEPCID)  20 MG tablet   CKD (chronic kidney disease) stage 3, GFR 30-59 ml/min (HCC)    Chronic, update kidney function when he returns for fasting labs.      Relevant Orders   Comprehensive metabolic panel   CBC with Differential/Platelet   Microalbumin / creatinine urine ratio   VITAMIN D 25 Hydroxy (Vit-D Deficiency, Fractures)   BPH associated with nocturia   Diet-controlled diabetes mellitus ([facility])    A1c remains in diet controlled range. Encouraged limiting sugars/sweets in diet.       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  Update PSA when he returns fasting.       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  Update ESR.       Relevant Medications   traMADol (ULTRAM) 50 MG tablet   Other Relevant Orders   Sedimentation rate   Fatigue    Notes ongoing fatigue over last 3-4 months. Check labs for reversible causes.       Relevant Orders   Comprehensive metabolic panel   CBC with Differential/Platelet   TSH   Vitamin B12   OSA (obstructive sleep apnea)    Previously had trouble with mask use - has not recently seen pulm.       Dysphagia    Notes symptoms recurring.  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  Reassuring EGD 09/2020 s/p empiric esophageal dilation with benefit.  rec trial of pepcid 74m nightly.        Lumbar radiculopathy, right    S/p ESI earlier this year (Jacelyn Grip. Notes ongoing R leg pain, not controlled with gabapentin trial or OTC NSAIDs. H/o intolerance to stronger narcotics. Will trial tramadol 533m1/2-1 tab BID PRN #20 - update with effect.         Meds ordered this encounter  Medications   amLODipine (NORVASC) 5 MG tablet    Sig: Take 1 tablet (5 mg total) by mouth daily.    Dispense:  90 tablet    Refill:  3   atorvastatin (LIPITOR) 20 MG tablet    Sig: Take 1 tablet (20 mg total) by mouth daily at 6 PM.    Dispense:  90 tablet    Refill:  3   famotidine (PEPCID) 20 MG tablet    Sig: Take 1 tablet (20 mg total) by mouth at bedtime.    Dispense:  90 tablet    Refill:  3   traMADol (ULTRAM) 50 MG tablet    Sig: Take 0.5-1 tablets (25-50 mg total) by mouth 2 (two) times daily as needed for up to 5 days.    Dispense:  20 tablet    Refill:  0   Orders Placed This Encounter  Procedures   PSA     Standing Status:   Future    Standing Expiration Date:   12/22/2022   Lipid panel    Standing Status:   Future    Standing Expiration Date:   12/22/2022   Comprehensive metabolic panel    Standing Status:   Future    Standing Expiration Date:   12/22/2022   CBC with Differential/Platelet    Standing Status:   Future    Standing Expiration Date:   12/22/2022  Microalbumin / creatinine urine ratio    Standing Status:   Future    Standing Expiration Date:   12/22/2022   Sedimentation rate    Standing Status:   Future    Standing Expiration Date:   12/22/2022   VITAMIN D 25 Hydroxy (Vit-D Deficiency, Fractures)    Standing Status:   Future    Standing Expiration Date:   12/22/2022   TSH    Standing Status:   Future    Standing Expiration Date:   12/22/2022   Vitamin B12    Standing Status:   Future    Standing Expiration Date:   12/22/2022   HgB A1c    Patient instructions: For pain - try tramadol 1/2-1 tablet as needed.  Continue seeing back doctors.  A1c check today.  Schedule lab visit for fasting blood work one morning this week.  If interested, check with pharmacy about new 2 shot shingles series (shingrix).  Schedule diabetic eye exam.  Bring [place] a copy of your updated living will.  Take pepcid 31m nightly for swallowing difficulty. If ongoing, let [place] for return to GI doctor.  Good to see you today Return as needed or in 6 months for diabetes follow up visit.   Follow up plan: Return in about 4 months (around 04/22/2022) for follow up visit.  JRia Bush MD

## 2021-12-21 NOTE — Assessment & Plan Note (Signed)
Stable period.  ?Update ESR.  ?

## 2021-12-21 NOTE — Assessment & Plan Note (Signed)
Advanced directive - has completed. HCPOA would be daughter Kevin Campbell. Asked to bring [place] a copy when completed.  ?

## 2021-12-21 NOTE — Assessment & Plan Note (Signed)
S/p seed implants 2013.  ?Update PSA when he returns fasting.  ?

## 2021-12-21 NOTE — Assessment & Plan Note (Signed)
Chronic, deteriorated. Discussed weight gain noted. Encouraged healthy diet and lifestyle choices to affect sustainable weight loss.  ?

## 2021-12-21 NOTE — Telephone Encounter (Signed)
Called and spoke to patient and scheduled an appointment today 12/21/21 at 11:00 am with Dr. Danise Mina. Patient stated that he will be here. ?

## 2021-12-21 NOTE — Assessment & Plan Note (Signed)
A1c remains in diet controlled range. Encouraged limiting sugars/sweets in diet.  ?

## 2021-12-21 NOTE — Telephone Encounter (Addendum)
Ok to add in open 30 min slot - would just make it a CPE as he's due. Thanks.  ?

## 2022-01-06 ENCOUNTER — Other Ambulatory Visit: Payer: Self-pay

## 2022-01-06 ENCOUNTER — Other Ambulatory Visit (INDEPENDENT_AMBULATORY_CARE_PROVIDER_SITE_OTHER): Payer: PPO

## 2022-01-06 DIAGNOSIS — C61 Malignant neoplasm of prostate: Secondary | ICD-10-CM | POA: Diagnosis not present

## 2022-01-06 DIAGNOSIS — N183 Chronic kidney disease, stage 3 unspecified: Secondary | ICD-10-CM

## 2022-01-06 DIAGNOSIS — E785 Hyperlipidemia, unspecified: Secondary | ICD-10-CM | POA: Diagnosis not present

## 2022-01-06 DIAGNOSIS — E1169 Type 2 diabetes mellitus with other specified complication: Secondary | ICD-10-CM

## 2022-01-06 DIAGNOSIS — M06041 Rheumatoid arthritis without rheumatoid factor, right hand: Secondary | ICD-10-CM

## 2022-01-06 DIAGNOSIS — R5383 Other fatigue: Secondary | ICD-10-CM | POA: Diagnosis not present

## 2022-01-06 DIAGNOSIS — M06042 Rheumatoid arthritis without rheumatoid factor, left hand: Secondary | ICD-10-CM

## 2022-01-06 LAB — MICROALBUMIN / CREATININE URINE RATIO
Creatinine,U: 131 mg/dL
Microalb Creat Ratio: 0.5 mg/g (ref 0.0–30.0)
Microalb, Ur: <0.7 mg/dL (ref 0.0–1.9)

## 2022-01-06 LAB — CBC WITH DIFFERENTIAL/PLATELET
Basophils Absolute: 0.1 10*3/uL (ref 0.0–0.1)
Basophils Relative: 0.7 % (ref 0.0–3.0)
Eosinophils Absolute: 0.2 10*3/uL (ref 0.0–0.7)
Eosinophils Relative: 3.4 % (ref 0.0–5.0)
HCT: 44.3 % (ref 39.0–52.0)
Hemoglobin: 15.1 g/dL (ref 13.0–17.0)
Lymphocytes Relative: 26.9 % (ref 12.0–46.0)
Lymphs Abs: 1.9 10*3/uL (ref 0.7–4.0)
MCHC: 34.1 g/dL (ref 30.0–36.0)
MCV: 86.9 fl (ref 78.0–100.0)
Monocytes Absolute: 0.7 10*3/uL (ref 0.1–1.0)
Monocytes Relative: 9.8 % (ref 3.0–12.0)
Neutro Abs: 4.2 10*3/uL (ref 1.4–7.7)
Neutrophils Relative %: 59.2 % (ref 43.0–77.0)
Platelets: 207 10*3/uL (ref 150.0–400.0)
RBC: 5.09 Mil/uL (ref 4.22–5.81)
RDW: 13.3 % (ref 11.5–15.5)
WBC: 7.2 10*3/uL (ref 4.0–10.5)

## 2022-01-06 LAB — COMPREHENSIVE METABOLIC PANEL
ALT: 19 U/L (ref 0–53)
AST: 17 U/L (ref 0–37)
Albumin: 4 g/dL (ref 3.5–5.2)
Alkaline Phosphatase: 60 U/L (ref 39–117)
BUN: 18 mg/dL (ref 6–23)
CO2: 23 mEq/L (ref 19–32)
Calcium: 9 mg/dL (ref 8.4–10.5)
Chloride: 108 mEq/L (ref 96–112)
Creatinine, Ser: 1.28 mg/dL (ref 0.40–1.50)
GFR: 55.58 mL/min — ABNORMAL LOW (ref >60.00)
Glucose, Bld: 139 mg/dL — ABNORMAL HIGH (ref 70–99)
Potassium: 4.1 mEq/L (ref 3.5–5.1)
Sodium: 140 mEq/L (ref 135–145)
Total Bilirubin: 0.9 mg/dL (ref 0.2–1.2)
Total Protein: 6.5 g/dL (ref 6.0–8.3)

## 2022-01-06 LAB — VITAMIN B12: Vitamin B-12: 103 pg/mL — ABNORMAL LOW (ref 211–911)

## 2022-01-06 LAB — LIPID PANEL
Cholesterol: 106 mg/dL (ref 0–200)
HDL: 28.5 mg/dL — ABNORMAL LOW (ref >39.00)
LDL Cholesterol: 59 mg/dL (ref 0–99)
NonHDL: 77.12
Total CHOL/HDL Ratio: 4
Triglycerides: 89 mg/dL (ref 0.0–149.0)
VLDL: 17.8 mg/dL (ref 0.0–40.0)

## 2022-01-06 LAB — VITAMIN D 25 HYDROXY (VIT D DEFICIENCY, FRACTURES): VITD: 22.37 ng/mL — ABNORMAL LOW (ref 30.00–100.00)

## 2022-01-06 LAB — TSH: TSH: 1.18 u[IU]/mL (ref 0.35–5.50)

## 2022-01-06 LAB — PSA: PSA: 0 ng/mL — ABNORMAL LOW (ref 0.10–4.00)

## 2022-01-06 LAB — SEDIMENTATION RATE: Sed Rate: 12 mm/hr (ref 0–20)

## 2022-01-08 ENCOUNTER — Other Ambulatory Visit: Payer: Self-pay | Admitting: Family Medicine

## 2022-01-08 DIAGNOSIS — E538 Deficiency of other specified B group vitamins: Secondary | ICD-10-CM | POA: Insufficient documentation

## 2022-01-08 DIAGNOSIS — E559 Vitamin D deficiency, unspecified: Secondary | ICD-10-CM | POA: Insufficient documentation

## 2022-01-08 MED ORDER — VITAMIN D3 25 MCG (1000 UT) PO CAPS: 1.0000 | ORAL_CAPSULE | Freq: Every day | ORAL | Status: DC

## 2022-01-14 ENCOUNTER — Ambulatory Visit (INDEPENDENT_AMBULATORY_CARE_PROVIDER_SITE_OTHER): Payer: PPO

## 2022-01-14 DIAGNOSIS — E538 Deficiency of other specified B group vitamins: Secondary | ICD-10-CM

## 2022-01-14 MED ORDER — CYANOCOBALAMIN 1000 MCG/ML IJ SOLN
1000.0000 ug | Freq: Once | INTRAMUSCULAR | Status: AC
  Administered 2022-01-14: 1000 ug via INTRAMUSCULAR

## 2022-01-14 NOTE — Progress Notes (Signed)
Pt called and set up his weekly B12 injections for April.  ?

## 2022-01-14 NOTE — Progress Notes (Signed)
Per orders of Dr. Duncan, injection of vit B12 given by Arely Tinner. Patient tolerated injection well.  

## 2022-01-21 ENCOUNTER — Ambulatory Visit (INDEPENDENT_AMBULATORY_CARE_PROVIDER_SITE_OTHER): Payer: PPO

## 2022-01-21 DIAGNOSIS — E538 Deficiency of other specified B group vitamins: Secondary | ICD-10-CM

## 2022-01-21 MED ORDER — CYANOCOBALAMIN 1000 MCG/ML IJ SOLN
1000.0000 ug | Freq: Once | INTRAMUSCULAR | Status: AC
  Administered 2022-01-21: 1000 ug via INTRAMUSCULAR

## 2022-01-21 NOTE — Progress Notes (Signed)
Per orders of Dr. Hollie Salk signed by Eugenia Pancoast, FNP in his absence injection of B12 2 of 4 weekly given by Kris Mouton. ?Patient tolerated injection well.  ?

## 2022-01-28 ENCOUNTER — Ambulatory Visit (INDEPENDENT_AMBULATORY_CARE_PROVIDER_SITE_OTHER): Payer: PPO

## 2022-01-28 DIAGNOSIS — E538 Deficiency of other specified B group vitamins: Secondary | ICD-10-CM

## 2022-01-28 MED ORDER — CYANOCOBALAMIN 1000 MCG/ML IJ SOLN
1000.0000 ug | Freq: Once | INTRAMUSCULAR | Status: AC
  Administered 2022-01-28: 1000 ug via INTRAMUSCULAR

## 2022-01-28 NOTE — Progress Notes (Signed)
Per orders of Dr. Copland, injection of vit B12 given by Brees Hounshell. Patient tolerated injection well.  

## 2022-02-04 ENCOUNTER — Ambulatory Visit: Payer: PPO

## 2022-02-09 ENCOUNTER — Ambulatory Visit (INDEPENDENT_AMBULATORY_CARE_PROVIDER_SITE_OTHER): Payer: PPO

## 2022-02-09 DIAGNOSIS — E538 Deficiency of other specified B group vitamins: Secondary | ICD-10-CM | POA: Diagnosis not present

## 2022-02-09 MED ORDER — CYANOCOBALAMIN 1000 MCG/ML IJ SOLN
1000.0000 ug | Freq: Once | INTRAMUSCULAR | Status: AC
  Administered 2022-02-09: 1000 ug via INTRAMUSCULAR

## 2022-02-09 NOTE — Progress Notes (Signed)
Per orders of Dr. Danise Mina, 4 of 4 weekly B12 injection given by Loreen Freud. ?Patient tolerated injection well. Pt will start monthly injections in June.  ?

## 2022-02-12 DIAGNOSIS — M5416 Radiculopathy, lumbar region: Secondary | ICD-10-CM | POA: Diagnosis not present

## 2022-03-12 ENCOUNTER — Ambulatory Visit (INDEPENDENT_AMBULATORY_CARE_PROVIDER_SITE_OTHER): Payer: PPO | Admitting: Family Medicine

## 2022-03-12 ENCOUNTER — Encounter: Payer: Self-pay | Admitting: Family Medicine

## 2022-03-12 VITALS — BP 136/66 | HR 72 | Temp 97.4°F | Ht 66.0 in | Wt 254.0 lb

## 2022-03-12 DIAGNOSIS — E538 Deficiency of other specified B group vitamins: Secondary | ICD-10-CM

## 2022-03-12 DIAGNOSIS — C44519 Basal cell carcinoma of skin of other part of trunk: Secondary | ICD-10-CM | POA: Insufficient documentation

## 2022-03-12 DIAGNOSIS — E119 Type 2 diabetes mellitus without complications: Secondary | ICD-10-CM | POA: Diagnosis not present

## 2022-03-12 DIAGNOSIS — L989 Disorder of the skin and subcutaneous tissue, unspecified: Secondary | ICD-10-CM | POA: Diagnosis not present

## 2022-03-12 DIAGNOSIS — E559 Vitamin D deficiency, unspecified: Secondary | ICD-10-CM | POA: Diagnosis not present

## 2022-03-12 DIAGNOSIS — I10 Essential (primary) hypertension: Secondary | ICD-10-CM

## 2022-03-12 MED ORDER — CYANOCOBALAMIN 1000 MCG/ML IJ SOLN
1000.0000 ug | Freq: Once | INTRAMUSCULAR | Status: AC
  Administered 2022-03-12: 1000 ug via INTRAMUSCULAR

## 2022-03-12 NOTE — Assessment & Plan Note (Addendum)
States had mole present for years, but over last several weeks now tender and very itchy.  Looks like chronic shallow skin ulcer. rec triple abx ointment or vaseline covered with bandaid daily to facilitate wound healing. In poorly healing wound, will also refer to derm to r/o skin cancer.

## 2022-03-12 NOTE — Assessment & Plan Note (Addendum)
Chronic, stable. Continue diet control. Declines DMSE.  Foot exam today Encouraged he schedule eye exam as due.

## 2022-03-12 NOTE — Patient Instructions (Addendum)
Check with your insurance (or pharmacy) to see what your insurance's preferred glucose meter brand is and let us know.  You are due for diabetic eye exam - call Dr Tommy Rainwater and schedule appointment.  We will refer you to dermatologist for evaluation of poorly healing wound on back.

## 2022-03-12 NOTE — Assessment & Plan Note (Signed)
Continue oral vitamin D 1000 IU daily.

## 2022-03-12 NOTE — Assessment & Plan Note (Signed)
Completed 4 weeks of weekly injection. Will continue monthly b12 shots.

## 2022-03-12 NOTE — Progress Notes (Signed)
Patient ID: Kevin Campbell, male    DOB: 04/01/1946, 76 y.o.   MRN: [id number]  This visit was conducted in person.  BP 136/66   Pulse 72   Temp (!) 97.4 F (36.3 C) (Temporal)   Ht '5\' 6"'$  (1.676 m)   Wt 254 lb (115.2 kg)   SpO2 94%   BMI 41.00 kg/m    CC: 4 month f/u visit  Subjective:   HPI: Kevin Campbell is a 76 y.o. male presenting on 03/12/2022 for Follow-up (Here for 1 mo f/u.) and Nevus (Wants a mole on back checked. )   See prior note for details - last visit found to have markedly low B12 (100) and vitamin D (22) levels, started on weekly replacement b12 shots, then transition to monthly B12 shots x6 months.   Would like mole checked on back - present for 10+ years, but more bothersome in the last few weeks - very itchy and sometimes tender.   Continues seeing Dr Jacelyn Grip for ongoing R lumbar radiculopathy with residual paresthesias/numbness.   DM - does not regularly check sugars. Compliant with antihyperglycemic regimen which includes: diet controlled. Denies low sugars or hypoglycemic symptoms. Denies paresthesias, blurry vision. Last diabetic eye exam DUE Tommy Rainwater). Glucometer brand: doesn't have. Last foot exam: DUE. DSME: declined. Lab Results  Component Value Date   HGBA1C 6.7 (A) 12/21/2021   Diabetic Foot Exam - Simple   Simple Foot Form Diabetic Foot exam was performed with the following findings: Yes 03/12/2022 11:05 AM  Visual Inspection No deformities, no ulcerations, no other skin breakdown bilaterally: Yes Sensation Testing Intact to touch and monofilament testing bilaterally: Yes Pulse Check Posterior Tibialis and Dorsalis pulse intact bilaterally: Yes Comments    Lab Results  Component Value Date   MICROALBUR <0.7 01/06/2022         Relevant past medical, surgical, family and social history reviewed and updated as indicated. Interim medical history since our last visit reviewed. Allergies and medications reviewed and updated. Outpatient  Medications Prior to Visit  Medication Sig Dispense Refill   amLODipine (NORVASC) 5 MG tablet Take 1 tablet (5 mg total) by mouth daily. 90 tablet 3   aspirin EC 81 MG tablet Take 81 mg by mouth daily.     atorvastatin (LIPITOR) 20 MG tablet Take 1 tablet (20 mg total) by mouth daily at 6 PM. 90 tablet 3   Cholecalciferol (VITAMIN D3) 25 MCG (1000 UT) CAPS Take 1 capsule (1,000 Units total) by mouth daily. 30 capsule    cyanocobalamin (,VITAMIN B-12,) 1000 MCG/ML injection Inject 1 mL (1,000 mcg total) into the muscle once a week, THEN 1 mL (1,000 mcg total) every 30 (thirty) days.     famotidine (PEPCID) 20 MG tablet Take 1 tablet (20 mg total) by mouth at bedtime. 90 tablet 3   gabapentin (NEURONTIN) 300 MG capsule Take 300 mg by mouth at bedtime.     0.9 %  sodium chloride infusion      No facility-administered medications prior to visit.     Per HPI unless specifically indicated in ROS section below Review of Systems  Objective:  BP 136/66   Pulse 72   Temp (!) 97.4 F (36.3 C) (Temporal)   Ht '5\' 6"'$  (1.676 m)   Wt 254 lb (115.2 kg)   SpO2 94%   BMI 41.00 kg/m   Wt Readings from Last 3 Encounters:  03/12/22 254 lb (115.2 kg)  12/21/21 254 lb (115.2 kg)  12/14/21  250 lb (113.4 kg)      Physical Exam Vitals and nursing note reviewed.  Constitutional:      Appearance: Normal appearance. He is not ill-appearing.  Eyes:     Extraocular Movements: Extraocular movements intact.     Conjunctiva/sclera: Conjunctivae normal.     Pupils: Pupils are equal, round, and reactive to light.  Cardiovascular:     Rate and Rhythm: Normal rate and regular rhythm.     Pulses: Normal pulses.     Heart sounds: Normal heart sounds. No murmur heard. Pulmonary:     Effort: Pulmonary effort is normal. No respiratory distress.     Breath sounds: Normal breath sounds. No wheezing, rhonchi or rales.  Musculoskeletal:     Right lower leg: No edema.     Left lower leg: No edema.     Comments:  See HPI for foot exam if done  Skin:    General: Skin is warm and dry.     Findings: Lesion present. No rash.     Comments: <2cm chronic shallow skin ulcer to upper right mid back without surrounding erythema or drainage   Neurological:     Mental Status: He is alert.  Psychiatric:        Mood and Affect: Mood normal.        Behavior: Behavior normal.        Results for orders placed or performed in visit on 01/06/22  Vitamin B12  Result Value Ref Range   Vitamin B-12 103 (L) 211 - 911 pg/mL  TSH  Result Value Ref Range   TSH 1.18 0.35 - 5.50 uIU/mL  VITAMIN D 25 Hydroxy (Vit-D Deficiency, Fractures)  Result Value Ref Range   VITD 22.37 (L) 30.00 - 100.00 ng/mL  Sedimentation rate  Result Value Ref Range   Sed Rate 12 0 - 20 mm/hr  Microalbumin / creatinine urine ratio  Result Value Ref Range   Microalb, Ur <0.7 0.0 - 1.9 mg/dL   Creatinine,U 131.0 mg/dL   Microalb Creat Ratio 0.5 0.0 - 30.0 mg/g  CBC with Differential/Platelet  Result Value Ref Range   WBC 7.2 4.0 - 10.5 K/uL   RBC 5.09 4.22 - 5.81 Mil/uL   Hemoglobin 15.1 13.0 - 17.0 g/dL   HCT 44.3 39.0 - 52.0 %   MCV 86.9 78.0 - 100.0 fl   MCHC 34.1 30.0 - 36.0 g/dL   RDW 13.3 11.5 - 15.5 %   Platelets 207.0 150.0 - 400.0 K/uL   Neutrophils Relative % 59.2 43.0 - 77.0 %   Lymphocytes Relative 26.9 12.0 - 46.0 %   Monocytes Relative 9.8 3.0 - 12.0 %   Eosinophils Relative 3.4 0.0 - 5.0 %   Basophils Relative 0.7 0.0 - 3.0 %   Neutro Abs 4.2 1.4 - 7.7 K/uL   Lymphs Abs 1.9 0.7 - 4.0 K/uL   Monocytes Absolute 0.7 0.1 - 1.0 K/uL   Eosinophils Absolute 0.2 0.0 - 0.7 K/uL   Basophils Absolute 0.1 0.0 - 0.1 K/uL  Comprehensive metabolic panel  Result Value Ref Range   Sodium 140 135 - 145 mEq/L   Potassium 4.1 3.5 - 5.1 mEq/L   Chloride 108 96 - 112 mEq/L   CO2 23 19 - 32 mEq/L   Glucose, Bld 139 (H) 70 - 99 mg/dL   BUN 18 6 - 23 mg/dL   Creatinine, Ser 1.28 0.40 - 1.50 mg/dL   Total Bilirubin 0.9 0.2 - 1.2  mg/dL   Alkaline Phosphatase  60 39 - 117 U/L   AST 17 0 - 37 U/L   ALT 19 0 - 53 U/L   Total Protein 6.5 6.0 - 8.3 g/dL   Albumin 4.0 3.5 - 5.2 g/dL   GFR 55.58 (L) >60.00 mL/min   Calcium 9.0 8.4 - 10.5 mg/dL  Lipid panel  Result Value Ref Range   Cholesterol 106 0 - 200 mg/dL   Triglycerides 89.0 0.0 - 149.0 mg/dL   HDL 28.50 (L) >39.00 mg/dL   VLDL 17.8 0.0 - 40.0 mg/dL   LDL Cholesterol 59 0 - 99 mg/dL   Total CHOL/HDL Ratio 4    NonHDL 77.12   PSA  Result Value Ref Range   PSA 0.00 (L) 0.10 - 4.00 ng/mL    Assessment & Plan:   Problem List Items Addressed This Visit     Essential hypertension    BP well controled today on amlodipine.        Diet-controlled diabetes mellitus ([place]) - Primary    Chronic, stable. Continue diet control. Declines DMSE.  Foot exam today Encouraged he schedule eye exam as due.        Vitamin B12 deficiency    Completed 4 weeks of weekly injection. Will continue monthly b12 shots.        Vitamin D deficiency    Continue oral vitamin D 1000 IU daily.        Skin lesion of back    States had mole present for years, but over last several weeks now tender and very itchy.  Looks like chronic shallow skin ulcer. rec triple abx ointment or vaseline covered with bandaid daily to facilitate wound healing. In poorly healing wound, will also refer to derm to r/o skin cancer.        Relevant Orders   Ambulatory referral to Dermatology     Meds ordered this encounter  Medications   cyanocobalamin ((VITAMIN B-12)) injection 1,000 mcg   Orders Placed This Encounter  Procedures   Ambulatory referral to Dermatology    Referral Priority:   Routine    Referral Type:   Consultation    Referral Reason:   Specialty Services Required    Requested Specialty:   Dermatology    Number of Visits Requested:   1     Patient Instructions  Check with your insurance (or pharmacy) to see what your insurance's preferred glucose meter brand is and  let us know.  You are due for diabetic eye exam - call Dr Tommy Rainwater and schedule appointment.  We will refer you to dermatologist for evaluation of poorly healing wound on back.    Follow up plan: Return in about 5 months (around 08/12/2022) for follow up visit.  Ria Bush, MD

## 2022-03-12 NOTE — Assessment & Plan Note (Signed)
BP well controled today on amlodipine.

## 2022-03-15 ENCOUNTER — Telehealth: Payer: Self-pay | Admitting: Family Medicine

## 2022-03-15 DIAGNOSIS — E119 Type 2 diabetes mellitus without complications: Secondary | ICD-10-CM

## 2022-03-15 MED ORDER — ACCU-CHEK GUIDE VI STRP: ORAL_STRIP | 3 refills | Status: DC

## 2022-03-15 MED ORDER — ACCU-CHEK GUIDE ME W/DEVICE KIT: PACK | 0 refills | Status: DC

## 2022-03-15 MED ORDER — ACCU-CHEK SOFTCLIX LANCETS MISC: 3 refills | Status: DC

## 2022-03-15 NOTE — Telephone Encounter (Signed)
Pt called and said that Dr Darnell Level wanted him to contact his insurance a blood sugar monitor and he did and they said it wouldn't be any cost to him but that he would just need to let them know which one and have a script sent to the pharmacy for it. [facility] on [address] in [place]

## 2022-03-15 NOTE — Telephone Encounter (Signed)
E-scribed rx for Accu-Chek Guide Me meter, test strips and Softclix lancets to [facility].

## 2022-03-16 ENCOUNTER — Telehealth: Payer: Self-pay

## 2022-03-16 DIAGNOSIS — M5416 Radiculopathy, lumbar region: Secondary | ICD-10-CM | POA: Diagnosis not present

## 2022-03-16 MED ORDER — ONETOUCH VERIO VI STRP: ORAL_STRIP | 3 refills | Status: DC

## 2022-03-16 MED ORDER — ONETOUCH VERIO REFLECT W/DEVICE KIT: PACK | 0 refills | Status: DC

## 2022-03-16 MED ORDER — ONETOUCH DELICA PLUS LANCET33G MISC: 3 refills | Status: DC

## 2022-03-16 NOTE — Telephone Encounter (Signed)
Prior auth started for Accu-Chek Guide Me w/Device kit. [place] Guiles Key: [id number] - [place] Case ID: [id number] - Rx #: [id number] Waiting for determination.

## 2022-03-16 NOTE — Telephone Encounter (Signed)
Prior auth started for Accu-Chek Softclix Lancets. [place] Guiles Key: [place] Case ID: [id number] - Rx #: [id number] Waiting for determination.

## 2022-03-16 NOTE — Addendum Note (Signed)
Addended by: Brenton Grills on: 01/09/5829 94:07 PM   Modules accepted: Orders

## 2022-03-16 NOTE — Telephone Encounter (Signed)
E-scribed rx for [employer], Verio test strips and Delica Plus 33 G lancets.

## 2022-03-16 NOTE — Telephone Encounter (Signed)
Prior auth started for Accu-Chek Guide strips. Wells Guiles Key: Nona Dell - [place] Case ID: [id number] - Rx #: [id number] Waiting for determination.

## 2022-03-16 NOTE — Telephone Encounter (Signed)
Received a call from [facility] will have a copy versus the one touch meter will have a zero dollar copay. Asking if we can switch this to One Touch.

## 2022-03-18 NOTE — Telephone Encounter (Signed)
Prior auth approved for Accu-Chek Guide strips. Approved on June 6 06-JUN-23:31-DEC-24 Accu-Chek Guide VI STRP Quantity:100;

## 2022-03-18 NOTE — Telephone Encounter (Signed)
Prior auth for [employer] Me w/Device kit has been approved. Approved on June 6 06-JUN-23:31-DEC-23 Accu-Chek Guide Me w/Device XX KIT Quantity:1;

## 2022-03-18 NOTE — Telephone Encounter (Signed)
Prior auth approved for Accu-Chek Softclix Lancets. Approved on June 6 06-JUN-23:31-DEC-23 Accu-Chek Softclix Lancets XX MISC Quantity:100;

## 2022-04-14 ENCOUNTER — Ambulatory Visit (INDEPENDENT_AMBULATORY_CARE_PROVIDER_SITE_OTHER): Payer: PPO

## 2022-04-14 DIAGNOSIS — E538 Deficiency of other specified B group vitamins: Secondary | ICD-10-CM

## 2022-04-14 MED ORDER — CYANOCOBALAMIN 1000 MCG/ML IJ SOLN
1000.0000 ug | Freq: Once | INTRAMUSCULAR | Status: AC
  Administered 2022-04-14: 1000 ug via INTRAMUSCULAR

## 2022-04-14 NOTE — Progress Notes (Signed)
Per orders of Dr. Danise Mina, monthly injection of B12 given by Loreen Freud. Patient tolerated injection well.

## 2022-04-16 DIAGNOSIS — M5416 Radiculopathy, lumbar region: Secondary | ICD-10-CM | POA: Diagnosis not present

## 2022-05-18 ENCOUNTER — Ambulatory Visit (INDEPENDENT_AMBULATORY_CARE_PROVIDER_SITE_OTHER): Payer: PPO

## 2022-05-18 DIAGNOSIS — E538 Deficiency of other specified B group vitamins: Secondary | ICD-10-CM | POA: Diagnosis not present

## 2022-05-18 MED ORDER — CYANOCOBALAMIN 1000 MCG/ML IJ SOLN
1000.0000 ug | Freq: Once | INTRAMUSCULAR | Status: AC
  Administered 2022-05-18: 1000 ug via INTRAMUSCULAR

## 2022-05-18 NOTE — Progress Notes (Signed)
Per orders of Dr. Danise Mina, injection of monthly B12 given by Kris Mouton. Patient tolerated injection well.

## 2022-06-22 ENCOUNTER — Ambulatory Visit (INDEPENDENT_AMBULATORY_CARE_PROVIDER_SITE_OTHER): Payer: PPO

## 2022-06-22 DIAGNOSIS — E538 Deficiency of other specified B group vitamins: Secondary | ICD-10-CM

## 2022-06-22 MED ORDER — CYANOCOBALAMIN 1000 MCG/ML IJ SOLN
1000.0000 ug | Freq: Once | INTRAMUSCULAR | Status: AC
  Administered 2022-06-22: 1000 ug via INTRAMUSCULAR

## 2022-06-22 NOTE — Progress Notes (Signed)
Per orders of Dr. Ria Bush, injection of Vitamin B 12 in lt deltoid given by Ozzie Hoyle. Patient tolerated injection well. Pt said he always gets in lt arm; pt declined getting injection in rt arm.ri

## 2022-06-23 ENCOUNTER — Ambulatory Visit (INDEPENDENT_AMBULATORY_CARE_PROVIDER_SITE_OTHER): Payer: PPO | Admitting: Family Medicine

## 2022-06-23 ENCOUNTER — Encounter: Payer: Self-pay | Admitting: Family Medicine

## 2022-06-23 VITALS — BP 146/76 | HR 61 | Temp 98.0°F | Ht 66.0 in | Wt 233.0 lb

## 2022-06-23 DIAGNOSIS — R1319 Other dysphagia: Secondary | ICD-10-CM

## 2022-06-23 DIAGNOSIS — E559 Vitamin D deficiency, unspecified: Secondary | ICD-10-CM

## 2022-06-23 DIAGNOSIS — Z23 Encounter for immunization: Secondary | ICD-10-CM

## 2022-06-23 DIAGNOSIS — Z7189 Other specified counseling: Secondary | ICD-10-CM | POA: Diagnosis not present

## 2022-06-23 DIAGNOSIS — E119 Type 2 diabetes mellitus without complications: Secondary | ICD-10-CM | POA: Diagnosis not present

## 2022-06-23 DIAGNOSIS — I10 Essential (primary) hypertension: Secondary | ICD-10-CM | POA: Diagnosis not present

## 2022-06-23 DIAGNOSIS — E538 Deficiency of other specified B group vitamins: Secondary | ICD-10-CM | POA: Diagnosis not present

## 2022-06-23 DIAGNOSIS — K219 Gastro-esophageal reflux disease without esophagitis: Secondary | ICD-10-CM | POA: Diagnosis not present

## 2022-06-23 LAB — POCT GLYCOSYLATED HEMOGLOBIN (HGB A1C): Hemoglobin A1C: 6 % — AB (ref 4.0–5.6)

## 2022-06-23 MED ORDER — VITAMIN B-12 1000 MCG PO TABS: 1000.0000 ug | ORAL_TABLET | Freq: Every day | ORAL | Status: DC

## 2022-06-23 MED ORDER — OMEPRAZOLE 40 MG PO CPDR: 40.0000 mg | DELAYED_RELEASE_CAPSULE | Freq: Every day | ORAL | 3 refills | Status: DC

## 2022-06-23 MED ORDER — OMEPRAZOLE 40 MG PO CPDR: 40.0000 mg | DELAYED_RELEASE_CAPSULE | Freq: Every day | ORAL | 1 refills | Status: DC

## 2022-06-23 NOTE — Assessment & Plan Note (Signed)
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 EGD didn't show stricture but underwent empiric dilation. Notes ongoing difficulty. Trial omeprazole, refer back to GI if ongoing difficulty.

## 2022-06-23 NOTE — Assessment & Plan Note (Signed)
Has completed 6 months of b12 shots.  Transition to oral 1037mg b12 daily.  Recheck B12 at next labs.

## 2022-06-23 NOTE — Patient Instructions (Addendum)
Flu shot today  Stop pepcid. Try omeprazole '40mg'$  daily for 2 weeks then as needed. This is a acid reflux medicine.  Stop b12 shots, start oral b12 1067mg tablet over the counter daily.  You can fill one touch meter which should be covered by insurance.  Schedule eye exam if it's been over a year.  Advanced directive packet provided today. Fill out, get notarized and bring [place] copy.  Sugars are now in prediabetes range! Return in 3 months for diabetes follow up visit.

## 2022-06-23 NOTE — Assessment & Plan Note (Signed)
He stopped vitamin D as may have caused sedation.  Consider recheck at next labs.

## 2022-06-23 NOTE — Assessment & Plan Note (Signed)
Advanced directive - HCPOA would be daughter Kevin Campbell. thinks would be DNR. Would not want prolonged life support if terminal condition. Packet provided today.

## 2022-06-23 NOTE — Assessment & Plan Note (Signed)
Chronic, mildly elevated today however he hasn't taken his am bp med yet and is worried about upcoming drive to [place] later today. No changes indicated at this time.

## 2022-06-23 NOTE — Assessment & Plan Note (Signed)
Notes ongoing intermittent dysphagia with rare reflux.  Reviewed EGD from 09/2020.  Pepcid has been ineffective - will trial omeprazole x 2 wks then PRN. Update if ongoing symptoms for GI referral.

## 2022-06-23 NOTE — Assessment & Plan Note (Signed)
Chronic, stable. A1c actually down to prediabetes range with weight loss.  Encouraged he schedule eye exam.

## 2022-06-23 NOTE — Assessment & Plan Note (Signed)
Congratulated on weight loss to date - he is motivated to continue weight loss efforts.

## 2022-06-23 NOTE — Progress Notes (Signed)
Patient ID: Kevin Campbell, male    DOB: 1946-01-12, 76 y.o.   MRN: [id number]  This visit was conducted in person.  BP (!) 146/76 (BP Location: Right Arm, Cuff Size: Large)   Pulse 61   Temp 98 F (36.7 C) (Temporal)   Ht '5\' 6"'  (1.676 m)   Wt 233 lb (105.7 kg)   SpO2 96%   BMI 37.61 kg/m    CC: DM f/u visit  Subjective:   HPI: Kevin Campbell is a 76 y.o. male presenting on 06/23/2022 for Diabetes (Here for f/u.)   20+ lb weight loss over the past 3 months! He's been changing diet, cutting down on candy bars, watching portion sizes, drinking pepsi zero instead of regular sodas, more water. Also more active. Overall feeling well.   Advanced directive - HCPOA would be daughter [name]. thinks would be DNR. Would not want prolonged life support if terminal condition. Packet provided today.   Continues monthly B12 shots, latest yesterday was 6th month of injections for low B12 (100). Also was started on vitamin D 1000 IU daily.  He stopped vitamin D and atorvastatin as he wasn't feeling well when taking these.   Still notes occasional dysphagia ie food getting stuck when swallowing despite pepcid. No nausea/vomiting, early satiety, epigastric pain. Normal EGD 09/2020, s/p esoph dilation without benefit.   Dermatology appt scheduled for 09/2021 Kevin Campbell)  HTN - Compliant with current antihypertensive regimen of amlodipine 66m daily.  Does check blood pressures at home: 128/78.  No low blood pressure readings or symptoms of dizziness/syncope.  Denies HA, vision changes, CP/tightness, SOB, leg swelling. [place]coming trip to [place]- he is somewhat nervous about this and attributes elevated BP to this.   DM - does not regularly check sugars. Compliant with antihyperglycemic regimen which includes: diet controlled. Denies low sugars or hypoglycemic symptoms. Denies paresthesias, blurry vision. Last diabetic eye exam DUE. Glucometer brand: OneTouch Verio - hasn't filled. Last  foot exam: 03/2022. DSME: declined. Lab Results  Component Value Date   HGBA1C 6.0 (A) 06/23/2022   Diabetic Foot Exam - Simple   No data filed    Lab Results  Component Value Date   MICROALBUR <0.7 01/06/2022         Relevant past medical, surgical, family and social history reviewed and updated as indicated. Interim medical history since our last visit reviewed. Allergies and medications reviewed and updated. Outpatient Medications Prior to Visit  Medication Sig Dispense Refill   amLODipine (NORVASC) 5 MG tablet Take 1 tablet (5 mg total) by mouth daily. 90 tablet 3   aspirin EC 81 MG tablet Take 81 mg by mouth daily.     Blood Glucose Monitoring Suppl (ONETOUCH VERIO REFLECT) w/Device KIT Use as instructed to check blood sugar once a day 1 kit 0   glucose blood (ONETOUCH VERIO) test strip Use as instructed to check blood sugar once a day 100 each 3   Lancets (ONETOUCH DELICA PLUS [id number] MISC Use as directed to check blood sugar once a day 100 each 3   cyanocobalamin (,VITAMIN B-12,) 1000 MCG/ML injection Inject 1 mL (1,000 mcg total) into the muscle once a week, THEN 1 mL (1,000 mcg total) every 30 (thirty) days.     gabapentin (NEURONTIN) 300 MG capsule Take 300 mg by mouth at bedtime. (Patient not taking: Reported on 06/23/2022)     atorvastatin (LIPITOR) 20 MG tablet Take 1 tablet (20 mg total) by mouth daily at 6 PM. (  Patient not taking: Reported on 06/23/2022) 90 tablet 3   Cholecalciferol (VITAMIN D3) 25 MCG (1000 UT) CAPS Take 1 capsule (1,000 Units total) by mouth daily. (Patient not taking: Reported on 06/23/2022) 30 capsule    famotidine (PEPCID) 20 MG tablet Take 1 tablet (20 mg total) by mouth at bedtime. (Patient not taking: Reported on 06/23/2022) 90 tablet 3   No facility-administered medications prior to visit.     Per HPI unless specifically indicated in ROS section below Review of Systems  Objective:  BP (!) 146/76 (BP Location: Right Arm, Cuff Size: Large)    Pulse 61   Temp 98 F (36.7 C) (Temporal)   Ht '5\' 6"'  (1.676 m)   Wt 233 lb (105.7 kg)   SpO2 96%   BMI 37.61 kg/m   Wt Readings from Last 3 Encounters:  06/23/22 233 lb (105.7 kg)  03/12/22 254 lb (115.2 kg)  12/21/21 254 lb (115.2 kg)      Physical Exam Vitals and nursing note reviewed.  Constitutional:      Appearance: Normal appearance. He is not ill-appearing.  Eyes:     Extraocular Movements: Extraocular movements intact.     Conjunctiva/sclera: Conjunctivae normal.     Pupils: Pupils are equal, round, and reactive to light.  Cardiovascular:     Rate and Rhythm: Normal rate and regular rhythm.     Pulses: Normal pulses.     Heart sounds: Normal heart sounds. No murmur heard. Pulmonary:     Effort: Pulmonary effort is normal. No respiratory distress.     Breath sounds: Normal breath sounds. No wheezing, rhonchi or rales.  Musculoskeletal:     Right lower leg: No edema.     Left lower leg: No edema.     Comments: See HPI for foot exam if done  Skin:    General: Skin is warm and dry.     Findings: No rash.  Neurological:     Mental Status: He is alert.  Psychiatric:        Mood and Affect: Mood normal.        Behavior: Behavior normal.       Results for orders placed or performed in visit on 06/23/22  POCT glycosylated hemoglobin (Hb A1C)  Result Value Ref Range   Hemoglobin A1C 6.0 (A) 4.0 - 5.6 %   HbA1c POC (<> result, manual entry)     HbA1c, POC (prediabetic range)     HbA1c, POC (controlled diabetic range)      Assessment & Plan:   Problem List Items Addressed This Visit     Advanced care planning/counseling discussion (Chronic)    Advanced directive - HCPOA would be daughter [name]. thinks would be DNR. Would not want prolonged life support if terminal condition. Packet provided today.       Severe obesity (BMI 35.0-39.9) with comorbidity ([place])    Congratulated on weight loss to date - he is motivated to continue weight loss efforts.        Essential hypertension    Chronic, mildly elevated today however he hasn't taken his am bp med yet and is worried about upcoming drive to [place] later today. No changes indicated at this time.       GERD    Notes ongoing intermittent dysphagia with rare reflux.  Reviewed EGD from 09/2020.  Pepcid has been ineffective - will trial omeprazole x 2 wks then PRN. Update if ongoing symptoms for GI referral.  Relevant Medications   omeprazole (PRILOSEC) 40 MG capsule   Diet-controlled diabetes mellitus ([place]) - Primary    Chronic, stable. A1c actually down to prediabetes range with weight loss.  Encouraged he schedule eye exam.       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 EGD didn't show stricture but underwent empiric dilation. Notes ongoing difficulty. Trial omeprazole, refer back to GI if ongoing difficulty.      Vitamin B12 deficiency    Has completed 6 months of b12 shots.  Transition to oral 1019mg b12 daily.  Recheck B12 at next labs.       Vitamin D deficiency    He stopped vitamin D as may have caused sedation.  Consider recheck at next labs.       Other Visit Diagnoses     Need for influenza vaccination       Relevant Orders   Flu Vaccine QUAD High Dose(Fluad) (Completed)        Meds ordered this encounter  Medications   DISCONTD: omeprazole (PRILOSEC) 40 MG capsule    Sig: Take 1 capsule (40 mg total) by mouth daily. Take daily for 2 weeks then as needed    Dispense:  30 capsule    Refill:  3   cyanocobalamin (VITAMIN B12) 1000 MCG tablet    Sig: Take 1 tablet (1,000 mcg total) by mouth daily.   omeprazole (PRILOSEC) 40 MG capsule    Sig: Take 1 capsule (40 mg total) by mouth daily. Take daily for 2 weeks then as needed    Dispense:  30 capsule    Refill:  1    Use this RF #   Orders Placed This Encounter   Procedures   Flu Vaccine QUAD High Dose(Fluad)   POCT glycosylated hemoglobin (Hb A1C)     Patient Instructions  Flu shot today  Stop pepcid. Try omeprazole 474mdaily for 2 weeks then as needed. This is a acid reflux medicine.  Stop b12 shots, start oral b12 100056mtablet over the counter daily.  You can fill one touch meter which should be covered by insurance.  Schedule eye exam if it's been over a year.  Advanced directive packet provided today. Fill out, get notarized and bring us [employer].  Sugars are now in prediabetes range! Return in 3 months for diabetes follow up visit.   Follow up plan: Return in about 3 months (around 09/22/2022) for follow up visit.  JavRia BushD

## 2022-07-19 DIAGNOSIS — M5416 Radiculopathy, lumbar region: Secondary | ICD-10-CM | POA: Diagnosis not present

## 2022-09-13 ENCOUNTER — Ambulatory Visit: Payer: PPO | Admitting: Dermatology

## 2022-09-13 DIAGNOSIS — L578 Other skin changes due to chronic exposure to nonionizing radiation: Secondary | ICD-10-CM

## 2022-09-13 DIAGNOSIS — L821 Other seborrheic keratosis: Secondary | ICD-10-CM

## 2022-09-13 DIAGNOSIS — D485 Neoplasm of uncertain behavior of skin: Secondary | ICD-10-CM

## 2022-09-13 DIAGNOSIS — C44519 Basal cell carcinoma of skin of other part of trunk: Secondary | ICD-10-CM

## 2022-09-13 DIAGNOSIS — C4491 Basal cell carcinoma of skin, unspecified: Secondary | ICD-10-CM

## 2022-09-13 HISTORY — DX: Basal cell carcinoma of skin, unspecified: C44.91

## 2022-09-13 NOTE — Patient Instructions (Signed)
Wound Care Instructions  Cleanse wound gently with soap and water once a day then pat dry with clean gauze. Apply a thin coat of Petrolatum (petroleum jelly, "Vaseline") over the wound (unless you have an allergy to this). We recommend that you use a new, sterile tube of Vaseline. Do not pick or remove scabs. Do not remove the yellow or white "healing tissue" from the base of the wound.  Cover the wound with fresh, clean, nonstick gauze and secure with paper tape. You may use Band-Aids in place of gauze and tape if the wound is small enough, but would recommend trimming much of the tape off as there is often too much. Sometimes Band-Aids can irritate the skin.  You should call the office for your biopsy report after 1 week if you have not already been contacted.  If you experience any problems, such as abnormal amounts of bleeding, swelling, significant bruising, significant pain, or evidence of infection, please call the office immediately.  FOR ADULT SURGERY PATIENTS: If you need something for pain relief you may take 1 extra strength Tylenol (acetaminophen) AND 2 Ibuprofen (200mg each) together every 4 hours as needed for pain. (do not take these if you are allergic to them or if you have a reason you should not take them.) Typically, you may only need pain medication for 1 to 3 days.     Due to recent changes in healthcare laws, you may see results of your pathology and/or laboratory studies on MyChart before the doctors have had a chance to review them. We understand that in some cases there may be results that are confusing or concerning to you. Please understand that not all results are received at the same time and often the doctors may need to interpret multiple results in order to provide you with the best plan of care or course of treatment. Therefore, we ask that you please give us 2 business days to thoroughly review all your results before contacting the office for clarification. Should  we see a critical lab result, you will be contacted sooner.   If You Need Anything After Your Visit  If you have any questions or concerns for your doctor, please call our main line at [phone] and press option 4 to reach your doctor's medical assistant. If no one answers, please leave a voicemail as directed and we will return your call as soon as possible. Messages left after 4 pm will be answered the following business day.   You may also send us a message via MyChart. We typically respond to MyChart messages within 1-2 business days.  For prescription refills, please ask your pharmacy to contact our office. Our fax number is [phone].  If you have an urgent issue when the clinic is closed that cannot wait until the next business day, you can page your doctor at the number below.    Please note that while we do our best to be available for urgent issues outside of office hours, we are not available 24/7.   If you have an urgent issue and are unable to reach us, you may choose to seek medical care at your doctor's office, retail clinic, urgent care center, or emergency room.  If you have a medical emergency, please immediately call 911 or go to the emergency department.  Pager Numbers  - Dr. Kowalski: [phone]  - Dr. Moye: [phone]  - Dr. Stewart: [phone]  In the event of inclement weather, please call our main line at   [phone] for an update on the status of any delays or closures.  Dermatology Medication Tips: Please keep the boxes that topical medications come in in order to help keep track of the instructions about where and how to use these. Pharmacies typically print the medication instructions only on the boxes and not directly on the medication tubes.   If your medication is too expensive, please contact our office at [phone] option 4 or send us a message through MyChart.   We are unable to tell what your co-pay for medications will be in  advance as this is different depending on your insurance coverage. However, we may be able to find a substitute medication at lower cost or fill out paperwork to get insurance to cover a needed medication.   If a prior authorization is required to get your medication covered by your insurance company, please allow us 1-2 business days to complete this process.  Drug prices often vary depending on where the prescription is filled and some pharmacies may offer cheaper prices.  The website www.goodrx.com contains coupons for medications through different pharmacies. The prices here do not account for what the cost may be with help from insurance (it may be cheaper with your insurance), but the website can give you the price if you did not use any insurance.  - You can print the associated coupon and take it with your prescription to the pharmacy.  - You may also stop by our office during regular business hours and pick up a GoodRx coupon card.  - If you need your prescription sent electronically to a different pharmacy, notify our office through [facility] MyChart or by phone at [phone] option 4.     Si Usted Necesita Algo Despus de Su Visita  Tambin puede enviarnos un mensaje a travs de MyChart. Por lo general respondemos a los mensajes de MyChart en el transcurso de 1 a 2 das hbiles.  Para renovar recetas, por favor pida a su farmacia que se ponga en contacto con nuestra oficina. Nuestro nmero de fax es el [phone].  Si tiene un asunto urgente cuando la clnica est cerrada y que no puede esperar hasta el siguiente da hbil, puede llamar/localizar a su doctor(a) al nmero que aparece a continuacin.   Por favor, tenga en cuenta que aunque hacemos todo lo posible para estar disponibles para asuntos urgentes fuera del horario de oficina, no estamos disponibles las 24 horas del da, los 7 das de la semana.   Si tiene un problema urgente y no puede comunicarse con nosotros, puede  optar por buscar atencin mdica  en el consultorio de su doctor(a), en una clnica privada, en un centro de atencin urgente o en una sala de emergencias.  Si tiene una emergencia mdica, por favor llame inmediatamente al 911 o vaya a la sala de emergencias.  Nmeros de bper  - Dr. Kowalski: [phone]  - Dra. Moye: [phone]  - Dra. Stewart: [phone]  En caso de inclemencias del tiempo, por favor llame a nuestra lnea principal al [phone] para una actualizacin sobre el estado de cualquier retraso o cierre.  Consejos para la medicacin en dermatologa: Por favor, guarde las cajas en las que vienen los medicamentos de uso tpico para ayudarle a seguir las instrucciones sobre dnde y cmo usarlos. Las farmacias generalmente imprimen las instrucciones del medicamento slo en las cajas y no directamente en los tubos del medicamento.   Si su medicamento es muy caro, por favor, pngase en contacto con   nuestra oficina llamando al [phone] y presione la opcin 4 o envenos un mensaje a travs de MyChart.   No podemos decirle cul ser su copago por los medicamentos por adelantado ya que esto es diferente dependiendo de la cobertura de su seguro. Sin embargo, es posible que podamos encontrar un medicamento sustituto a menor costo o llenar un formulario para que el seguro cubra el medicamento que se considera necesario.   Si se requiere una autorizacin previa para que su compaa de seguros cubra su medicamento, por favor permtanos de 1 a 2 das hbiles para completar este proceso.  Los precios de los medicamentos varan con frecuencia dependiendo del lugar de dnde se surte la receta y alguna farmacias pueden ofrecer precios ms baratos.  El sitio web www.goodrx.com tiene cupones para medicamentos de diferentes farmacias. Los precios aqu no tienen en cuenta lo que podra costar con la ayuda del seguro (puede ser ms barato con su seguro), pero el sitio web puede darle el  precio si no utiliz ningn seguro.  - Puede imprimir el cupn correspondiente y llevarlo con su receta a la farmacia.  - Tambin puede pasar por nuestra oficina durante el horario de atencin regular y recoger una tarjeta de cupones de GoodRx.  - Si necesita que su receta se enve electrnicamente a una farmacia diferente, informe a nuestra oficina a travs de MyChart de [facility] o por telfono llamando al [phone] y presione la opcin 4.  

## 2022-09-13 NOTE — Progress Notes (Signed)
   New Patient Visit  Subjective  Kevin Campbell is a 76 y.o. male who presents for the following: Other (New patient - mole on back that has been itching for about 6 months). The patient has spots, moles and lesions to be evaluated, some may be new or changing and the patient has concerns that these could be cancer.  The following portions of the chart were reviewed this encounter and updated as appropriate:   Tobacco  Allergies  Meds  Problems  Med Hx  Surg Hx  Fam Hx     Review of Systems:  No other skin or systemic complaints except as noted in HPI or Assessment and Plan.  Objective  Well appearing patient in no apparent distress; mood and affect are within normal limits.  A focused examination was performed including back. Relevant physical exam findings are noted in the Assessment and Plan.  Right Upper Back 3.1 x 2.1 cm pink crusted plaque        Assessment & Plan   Actinic Damage - chronic, secondary to cumulative UV radiation exposure/sun exposure over time - diffuse scaly erythematous macules with underlying dyspigmentation - Recommend daily broad spectrum sunscreen SPF 30+ to sun-exposed areas, reapply every 2 hours as needed.  - Recommend staying in the shade or wearing long sleeves, sun glasses (UVA+UVB protection) and wide brim hats (4-inch brim around the entire circumference of the hat). - Call for new or changing lesions.  Seborrheic Keratoses - Stuck-on, waxy, tan-brown papules and/or plaques  - Benign-appearing - Discussed benign etiology and prognosis. - Observe - Call for any changes  Neoplasm of uncertain behavior of skin Right Upper Back  Skin / nail biopsy Type of biopsy: tangential   Informed consent: discussed and consent obtained   Timeout: patient name, date of birth, surgical site, and procedure verified   Procedure prep:  Patient was prepped and draped in usual sterile fashion Prep type:  Isopropyl alcohol Anesthesia: the  lesion was anesthetized in a standard fashion   Anesthetic:  1% lidocaine w/ epinephrine 1-100,000 buffered w/ 8.4% NaHCO3 Instrument used: flexible razor blade   Hemostasis achieved with: pressure, aluminum chloride and electrodesiccation   Outcome: patient tolerated procedure well   Post-procedure details: sterile dressing applied and wound care instructions given   Dressing type: bandage and petrolatum    Specimen 1 - Surgical pathology Differential Diagnosis: BCC vs other  Check Margins: No  Will plan excision if +BCC   Return pending biopsy results.  I, Ashok Cordia, CMA, am acting as scribe for Sarina Ser, MD . Documentation: I have reviewed the above documentation for accuracy and completeness, and I agree with the above.  Sarina Ser, MD

## 2022-09-22 ENCOUNTER — Ambulatory Visit (INDEPENDENT_AMBULATORY_CARE_PROVIDER_SITE_OTHER): Payer: PPO | Admitting: Family Medicine

## 2022-09-22 ENCOUNTER — Encounter: Payer: Self-pay | Admitting: Family Medicine

## 2022-09-22 ENCOUNTER — Telehealth: Payer: Self-pay

## 2022-09-22 VITALS — BP 132/78 | HR 60 | Temp 97.7°F | Ht 66.0 in | Wt 231.8 lb

## 2022-09-22 DIAGNOSIS — E119 Type 2 diabetes mellitus without complications: Secondary | ICD-10-CM

## 2022-09-22 DIAGNOSIS — C44519 Basal cell carcinoma of skin of other part of trunk: Secondary | ICD-10-CM

## 2022-09-22 DIAGNOSIS — R7303 Prediabetes: Secondary | ICD-10-CM

## 2022-09-22 DIAGNOSIS — E538 Deficiency of other specified B group vitamins: Secondary | ICD-10-CM

## 2022-09-22 LAB — POCT GLYCOSYLATED HEMOGLOBIN (HGB A1C): Hemoglobin A1C: 6 % — AB (ref 4.0–5.6)

## 2022-09-22 NOTE — Assessment & Plan Note (Signed)
Biopsy confirmed BCC - awaiting derm f/u to see if further treatment needed. Area dressed with abx ointment and bandaid.

## 2022-09-22 NOTE — Progress Notes (Signed)
Patient ID: Kevin Campbell, male    DOB: Dec 14, 1945, 76 y.o.   MRN: [id number]  This visit was conducted in person.  BP 132/78   Pulse 60   Temp 97.7 F (36.5 C) (Temporal)   Ht _0  (1.676 m)   Wt 231 lb 12.8 oz (105.1 kg)   SpO2 95%   BMI 37.41 kg/m    CC: DM f/u visit  Subjective:   HPI: Kevin Campbell is a 76 y.o. male presenting on 09/22/2022 for Diabetes (Here for 3 mo f/u and vit B12 labs. )   Vit B12 deficiency - completed 6 months of b12 injections, has been on oral daily replacement for the past 3 months.   He had some pie for thanksgiving but not normally.   DM - does not regularly check sugars. Compliant with antihyperglycemic regimen which includes: diet controlled. Denies low sugars or hypoglycemic symptoms. Denies blurry vision. Occasional numbness to R foot, has seen Dr Jacelyn Grip s/p steroid injections into back. Last diabetic eye exam has not done. Glucometer brand: one touch - did not fill this meter. Last foot exam: 03/2022. DSME: declined Lab Results  Component Value Date   HGBA1C 6.0 (A) 09/22/2022   Diabetic Foot Exam - Simple   No data filed    Lab Results  Component Value Date   MICROALBUR <0.7 01/06/2022         Relevant past medical, surgical, family and social history reviewed and updated as indicated. Interim medical history since our last visit reviewed. Allergies and medications reviewed and updated. Outpatient Medications Prior to Visit  Medication Sig Dispense Refill   amLODipine (NORVASC) 5 MG tablet Take 1 tablet (5 mg total) by mouth daily. 90 tablet 3   aspirin EC 81 MG tablet Take 81 mg by mouth daily.     Blood Glucose Monitoring Suppl (ONETOUCH VERIO REFLECT) w/Device KIT Use as instructed to check blood sugar once a day 1 kit 0   cyanocobalamin (VITAMIN B12) 1000 MCG tablet Take 1 tablet (1,000 mcg total) by mouth daily.     gabapentin (NEURONTIN) 300 MG capsule Take 300 mg by mouth at bedtime.     glucose blood (ONETOUCH  VERIO) test strip Use as instructed to check blood sugar once a day 100 each 3   Lancets (ONETOUCH DELICA PLUS [id number]) MISC Use as directed to check blood sugar once a day 100 each 3   omeprazole (PRILOSEC) 40 MG capsule Take 1 capsule (40 mg total) by mouth daily. Take daily for 2 weeks then as needed 30 capsule 1   No facility-administered medications prior to visit.     Per HPI unless specifically indicated in ROS section below Review of Systems  Objective:  BP 132/78   Pulse 60   Temp 97.7 F (36.5 C) (Temporal)   Ht _1  (1.676 m)   Wt 231 lb 12.8 oz (105.1 kg)   SpO2 95%   BMI 37.41 kg/m   Wt Readings from Last 3 Encounters:  09/22/22 231 lb 12.8 oz (105.1 kg)  06/23/22 233 lb (105.7 kg)  03/12/22 254 lb (115.2 kg)      Physical Exam Vitals and nursing note reviewed.  Constitutional:      Appearance: Normal appearance. He is not ill-appearing.  HENT:     Head: Normocephalic and atraumatic.     Mouth/Throat:     Mouth: Mucous membranes are moist.     Pharynx: Oropharynx is clear. No oropharyngeal exudate or  posterior oropharyngeal erythema.  Eyes:     Extraocular Movements: Extraocular movements intact.     Conjunctiva/sclera: Conjunctivae normal.     Pupils: Pupils are equal, round, and reactive to light.  Cardiovascular:     Rate and Rhythm: Normal rate and regular rhythm.     Pulses: Normal pulses.     Heart sounds: Normal heart sounds. No murmur heard. Pulmonary:     Effort: Pulmonary effort is normal. No respiratory distress.     Breath sounds: Normal breath sounds. No wheezing, rhonchi or rales.  Musculoskeletal:     Right lower leg: No edema.     Left lower leg: No edema.     Comments: See HPI for foot exam if done  Skin:    General: Skin is warm and dry.     Findings: No rash.  Neurological:     Mental Status: He is alert.  Psychiatric:        Mood and Affect: Mood normal.        Behavior: Behavior normal.       Results for orders placed  or performed in visit on 09/22/22  POCT glycosylated hemoglobin (Hb A1C)  Result Value Ref Range   Hemoglobin A1C 6.0 (A) 4.0 - 5.6 %   HbA1c POC (<> result, manual entry)     HbA1c, POC (prediabetic range)     HbA1c, POC (controlled diabetic range)      Assessment & Plan:   Problem List Items Addressed This Visit       Unprioritized   Prediabetes - Primary    Remains in prediabetes range - congratulated on maintaining the weight loss to date. Reviewed limiting added sugars/carbs in diet.       Vitamin B12 deficiency    Completed 6 months of b12 shots, now taking vit B12 1036mg daily.  Check B12 levels next labwork in 3 months.       Basal cell carcinoma (BCC) of upper back excluding scapular region    Biopsy confirmed BCC - awaiting derm f/u to see if further treatment needed. Area dressed with abx ointment and bandaid.         No orders of the defined types were placed in this encounter.  Orders Placed This Encounter  Procedures   POCT glycosylated hemoglobin (Hb A1C)     Patient Instructions  Return in 3 months for wellness visit/physical, prior fasting for blood work Congrats on sugar control - remaining stable in prediabetes range. Continue watching added sugars in the diet.   Follow up plan: Return in about 3 months (around 12/22/2022) for annual exam, prior fasting for blood work, medicare wellness visit.  JRia Bush MD

## 2022-09-22 NOTE — Assessment & Plan Note (Addendum)
Remains in prediabetes range - congratulated on maintaining the weight loss to date. Reviewed limiting added sugars/carbs in diet.

## 2022-09-22 NOTE — Assessment & Plan Note (Signed)
Completed 6 months of b12 shots, now taking vit B12 1067mg daily.  Check B12 levels next labwork in 3 months.

## 2022-09-22 NOTE — Patient Instructions (Addendum)
Return in 3 months for wellness visit/physical, prior fasting for blood work Congrats on sugar control - remaining stable in prediabetes range. Continue watching added sugars in the diet.

## 2022-09-22 NOTE — Telephone Encounter (Signed)
Discussed biopsy results with patient, scheduled surgery

## 2022-09-25 ENCOUNTER — Encounter: Payer: Self-pay | Admitting: Dermatology

## 2022-11-02 ENCOUNTER — Encounter: Payer: Self-pay | Admitting: Dermatology

## 2022-11-02 ENCOUNTER — Telehealth: Payer: Self-pay

## 2022-11-02 ENCOUNTER — Ambulatory Visit: Payer: PPO | Admitting: Dermatology

## 2022-11-02 DIAGNOSIS — C44519 Basal cell carcinoma of skin of other part of trunk: Secondary | ICD-10-CM | POA: Diagnosis not present

## 2022-11-02 DIAGNOSIS — D485 Neoplasm of uncertain behavior of skin: Secondary | ICD-10-CM | POA: Diagnosis not present

## 2022-11-02 DIAGNOSIS — L578 Other skin changes due to chronic exposure to nonionizing radiation: Secondary | ICD-10-CM

## 2022-11-02 MED ORDER — DOXYCYCLINE HYCLATE 100 MG PO TABS: 100.0000 mg | ORAL_TABLET | Freq: Two times a day (BID) | ORAL | 0 refills | Status: AC

## 2022-11-02 MED ORDER — MUPIROCIN 2 % EX OINT: 1.0000 | TOPICAL_OINTMENT | Freq: Every day | CUTANEOUS | 0 refills | Status: AC

## 2022-11-02 NOTE — Patient Instructions (Addendum)

## 2022-11-02 NOTE — Progress Notes (Signed)
Follow-Up Visit   Subjective  Kevin Campbell is a 77 y.o. male who presents for the following: BCC bx proven (R upper back, pt presents for excision). The patient has spots, moles and lesions to be evaluated, some may be new or changing and the patient has concerns that these could be cancer.  The following portions of the chart were reviewed this encounter and updated as appropriate:   Tobacco  Allergies  Meds  Problems  Med Hx  Surg Hx  Fam Hx     Review of Systems:  No other skin or systemic complaints except as noted in HPI or Assessment and Plan.  Objective  Well appearing patient in no apparent distress; mood and affect are within normal limits.  A focused examination was performed including back. Relevant physical exam findings are noted in the Assessment and Plan.  Right Upper Back Pink bx site  Left post shoulder Pink patch   Assessment & Plan  Basal cell carcinoma (BCC) of skin of other part of torso Right Upper Back Skin excision  Lesion length (cm):  3.2 Lesion width (cm):  3.2 Margin per side (cm):  0.2 Total excision diameter (cm):  3.6 Informed consent: discussed and consent obtained   Timeout: patient name, date of birth, surgical site, and procedure verified   Procedure prep:  Patient was prepped and draped in usual sterile fashion Prep type:  Isopropyl alcohol and povidone-iodine Anesthesia: the lesion was anesthetized in a standard fashion   Anesthetic:  1% lidocaine w/ epinephrine 1-100,000 buffered w/ 8.4% NaHCO3 Instrument used: #15 blade   Hemostasis achieved with: pressure   Hemostasis achieved with comment:  Electrocautery Outcome: patient tolerated procedure well with no complications   Post-procedure details: sterile dressing applied and wound care instructions given   Dressing type: bandage, pressure dressing and bacitracin (Mupirocin)    Skin repair Complexity:  Complex Final length (cm):  7 Reason for type of repair: reduce  tension to allow closure, reduce the risk of dehiscence, infection, and necrosis, reduce subcutaneous dead space and avoid a hematoma, allow closure of the large defect, preserve normal anatomy, preserve normal anatomical and functional relationships and enhance both functionality and cosmetic results   Undermining: area extensively undermined   Undermining comment:  Undermining Defect 3.2 cm Subcutaneous layers (deep stitches):  Suture size:  2-0 Suture type: Vicryl (polyglactin 910)   Subcutaneous suture technique: Inverted Dermal. Fine/surface layer approximation (top stitches):  Suture size:  2-0 Suture type: nylon   Stitches: simple running   Suture removal (days):  7 Hemostasis achieved with: suture and pressure Outcome: patient tolerated procedure well with no complications   Post-procedure details: sterile dressing applied and wound care instructions given   Dressing type: bandage, pressure dressing and bacitracin (Mupirocin)    mupirocin ointment (BACTROBAN) 2 % Apply 1 Application topically daily. Qd to excision site doxycycline (VIBRA-TABS) 100 MG tablet Take 1 tablet (100 mg total) by mouth 2 (two) times daily for 7 days. With food and plenty of fluid  Specimen 1 - Surgical pathology Differential Diagnosis: Bx proven BCC  Check Margins: yes Pink bx site [phone]  Bx proven, excised today Start Mupirocin oint qd to excision site Doxycycline 100 mg 1 po bid with food and plenty of fluid x 7 days  Neoplasm of uncertain behavior of skin Left post shoulder Will plan biopsy on follow up to R/[facility]  Actinic Damage - chronic, secondary to cumulative UV radiation exposure/sun exposure over time - diffuse scaly  erythematous macules with underlying dyspigmentation - Recommend daily broad spectrum sunscreen SPF 30+ to sun-exposed areas, reapply every 2 hours as needed.  - Recommend staying in the shade or wearing long sleeves, sun glasses (UVA+UVB protection) and wide brim  hats (4-inch brim around the entire circumference of the hat). - Call for new or changing lesions.  Return in about 1 week (around 11/09/2022) for suture removal.  I, Ashok Cordia, CMA, am acting as scribe for Sarina Ser, MD . Documentation: I have reviewed the above documentation for accuracy and completeness, and I agree with the above.  Sarina Ser, MD

## 2022-11-02 NOTE — Telephone Encounter (Signed)
Tried to call patient regarding surgery. He does not have voicemail set up/hd

## 2022-11-09 ENCOUNTER — Ambulatory Visit (INDEPENDENT_AMBULATORY_CARE_PROVIDER_SITE_OTHER): Payer: PPO | Admitting: Dermatology

## 2022-11-09 DIAGNOSIS — C4491 Basal cell carcinoma of skin, unspecified: Secondary | ICD-10-CM

## 2022-11-09 DIAGNOSIS — C44619 Basal cell carcinoma of skin of left upper limb, including shoulder: Secondary | ICD-10-CM

## 2022-11-09 DIAGNOSIS — D485 Neoplasm of uncertain behavior of skin: Secondary | ICD-10-CM

## 2022-11-09 HISTORY — DX: Basal cell carcinoma of skin, unspecified: C44.91

## 2022-11-09 NOTE — Progress Notes (Unsigned)
   Follow-Up Visit   Subjective  Kevin Campbell is a 77 y.o. male who presents for the following: Suture removal (Pathology proven margins free BCC of the R upper back. Patient is here today for suture removal). The patient has spots, moles and lesions to be evaluated, some may be new or changing and the patient has concerns that these could be cancer.  The following portions of the chart were reviewed this encounter and updated as appropriate:   Tobacco  Allergies  Meds  Problems  Med Hx  Surg Hx  Fam Hx     Review of Systems:  No other skin or systemic complaints except as noted in HPI or Assessment and Plan.  Objective  Well appearing patient in no apparent distress; mood and affect are within normal limits.  A focused examination was performed including the face, trunk, and extremities. Relevant physical exam findings are noted in the Assessment and Plan.  L post shoulder 1.5 cm pink patch    Assessment & Plan  Neoplasm of uncertain behavior of skin L post shoulder  Epidermal / dermal shaving  Lesion diameter (cm):  1.5 Informed consent: discussed and consent obtained   Timeout: patient name, date of birth, surgical site, and procedure verified   Procedure prep:  Patient was prepped and draped in usual sterile fashion Prep type:  Isopropyl alcohol Anesthesia: the lesion was anesthetized in a standard fashion   Anesthetic:  1% lidocaine w/ epinephrine 1-100,000 buffered w/ 8.4% NaHCO3 Instrument used: flexible razor blade   Hemostasis achieved with: pressure, aluminum chloride and electrodesiccation   Outcome: patient tolerated procedure well   Post-procedure details: sterile dressing applied and wound care instructions given   Dressing type: bandage and petrolatum    Destruction of lesion Complexity: extensive   Destruction method: electrodesiccation and curettage   Informed consent: discussed and consent obtained   Timeout:  patient name, date of birth,  surgical site, and procedure verified Procedure prep:  Patient was prepped and draped in usual sterile fashion Prep type:  Isopropyl alcohol Anesthesia: the lesion was anesthetized in a standard fashion   Anesthetic:  1% lidocaine w/ epinephrine 1-100,000 buffered w/ 8.4% NaHCO3 Curettage performed in three different directions: Yes   Electrodesiccation performed over the curetted area: Yes   Lesion length (cm):  1.5 Lesion width (cm):  1.5 Margin per side (cm):  0.2 Final wound size (cm):  1.9 Hemostasis achieved with:  pressure, aluminum chloride and electrodesiccation Outcome: patient tolerated procedure well with no complications   Post-procedure details: sterile dressing applied and wound care instructions given   Dressing type: bandage and petrolatum    Specimen 1 - Surgical pathology Differential Diagnosis: D48.5 r/o BCC ED&C today  Check Margins: No  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  - Recommend staying in the shade or wearing long sleeves, sun glasses (UVA+UVB protection) and wide brim hats (4-inch brim around the entire circumference of the hat). - Call for new or changing lesions.  Return in about 4 months (around 03/10/2023) for TBSE.  Luther Redo, CMA, am acting as scribe for Sarina Ser, MD . Documentation: I have reviewed the above documentation for accuracy and completeness, and I agree with the above.  Sarina Ser, MD

## 2022-11-09 NOTE — Patient Instructions (Signed)
Electrodesiccation and Curettage ("Scrape and Burn") Wound Care Instructions  Leave the original bandage on for 24 hours if possible.  If the bandage becomes soaked or soiled before that time, it is OK to remove it and examine the wound.  A small amount of post-operative bleeding is normal.  If excessive bleeding occurs, remove the bandage, place gauze over the site and apply continuous pressure (no peeking) over the area for 30 minutes. If this does not work, please call our clinic as soon as possible or page your doctor if it is after hours.   Once a day, cleanse the wound with soap and water. It is fine to shower. If a thick crust develops you may use a Q-tip dipped into dilute hydrogen peroxide (mix 1:1 with water) to dissolve it.  Hydrogen peroxide can slow the healing process, so use it only as needed.    After washing, apply petroleum jelly (Vaseline) or an antibiotic ointment if your doctor prescribed one for you, followed by a bandage.    For best healing, the wound should be covered with a layer of ointment at all times. If you are not able to keep the area covered with a bandage to hold the ointment in place, this may mean re-applying the ointment several times a day.  Continue this wound care until the wound has healed and is no longer open. It may take several weeks for the wound to heal and close.  Itching and mild discomfort is normal during the healing process.  If you have any discomfort, you can take Tylenol (acetaminophen) or ibuprofen as directed on the bottle. (Please do not take these if you have an allergy to them or cannot take them for another reason).  Some redness, tenderness and white or yellow material in the wound is normal healing.  If the area becomes very sore and red, or develops a thick yellow-green material (pus), it may be infected; please notify us.    Wound healing continues for up to one year following surgery. It is not unusual to experience pain in the scar  from time to time during the interval.  If the pain becomes severe or the scar thickens, you should notify the office.    A slight amount of redness in a scar is expected for the first six months.  After six months, the redness will fade and the scar will soften and fade.  The color difference becomes less noticeable with time.  If there are any problems, return for a post-op surgery check at your earliest convenience.  To improve the appearance of the scar, you can use silicone scar gel, cream, or sheets (such as Mederma or Serica) every night for up to one year. These are available over the counter (without a prescription).  Please call our office at [phone] for any questions or concerns.     Due to recent changes in healthcare laws, you may see results of your pathology and/or laboratory studies on MyChart before the doctors have had a chance to review them. We understand that in some cases there may be results that are confusing or concerning to you. Please understand that not all results are received at the same time and often the doctors may need to interpret multiple results in order to provide you with the best plan of care or course of treatment. Therefore, we ask that you please give us 2 business days to thoroughly review all your results before contacting the office for clarification. Should   we see a critical lab result, you will be contacted sooner.   If You Need Anything After Your Visit  If you have any questions or concerns for your doctor, please call our main line at [phone] and press option 4 to reach your doctor's medical assistant. If no one answers, please leave a voicemail as directed and we will return your call as soon as possible. Messages left after 4 pm will be answered the following business day.   You may also send us a message via MyChart. We typically respond to MyChart messages within 1-2 business days.  For prescription refills, please ask your  pharmacy to contact our office. Our fax number is [phone].  If you have an urgent issue when the clinic is closed that cannot wait until the next business day, you can page your doctor at the number below.    Please note that while we do our best to be available for urgent issues outside of office hours, we are not available 24/7.   If you have an urgent issue and are unable to reach us, you may choose to seek medical care at your doctor's office, retail clinic, urgent care center, or emergency room.  If you have a medical emergency, please immediately call 911 or go to the emergency department.  Pager Numbers  - Dr. Kowalski: [phone]  - Dr. Moye: [phone]  - Dr. Stewart: [phone]  In the event of inclement weather, please call our main line at [phone] for an update on the status of any delays or closures.  Dermatology Medication Tips: Please keep the boxes that topical medications come in in order to help keep track of the instructions about where and how to use these. Pharmacies typically print the medication instructions only on the boxes and not directly on the medication tubes.   If your medication is too expensive, please contact our office at [phone] option 4 or send us a message through MyChart.   We are unable to tell what your co-pay for medications will be in advance as this is different depending on your insurance coverage. However, we may be able to find a substitute medication at lower cost or fill out paperwork to get insurance to cover a needed medication.   If a prior authorization is required to get your medication covered by your insurance company, please allow us 1-2 business days to complete this process.  Drug prices often vary depending on where the prescription is filled and some pharmacies may offer cheaper prices.  The website www.goodrx.com contains coupons for medications through different pharmacies. The prices here do not  account for what the cost may be with help from insurance (it may be cheaper with your insurance), but the website can give you the price if you did not use any insurance.  - You can print the associated coupon and take it with your prescription to the pharmacy.  - You may also stop by our office during regular business hours and pick up a GoodRx coupon card.  - If you need your prescription sent electronically to a different pharmacy, notify our office through [facility] MyChart or by phone at [phone] option 4.     Si Usted Necesita Algo Despus de Su Visita  Tambin puede enviarnos un mensaje a travs de MyChart. Por lo general respondemos a los mensajes de MyChart en el transcurso de 1 a 2 das hbiles.  Para renovar recetas, por favor pida a su farmacia que se ponga en   contacto con nuestra oficina. Nuestro nmero de fax es el [phone].  Si tiene un asunto urgente cuando la clnica est cerrada y que no puede esperar hasta el siguiente da hbil, puede llamar/localizar a su doctor(a) al nmero que aparece a continuacin.   Por favor, tenga en cuenta que aunque hacemos todo lo posible para estar disponibles para asuntos urgentes fuera del horario de oficina, no estamos disponibles las 24 horas del da, los 7 das de la semana.   Si tiene un problema urgente y no puede comunicarse con nosotros, puede optar por buscar atencin mdica  en el consultorio de su doctor(a), en una clnica privada, en un centro de atencin urgente o en una sala de emergencias.  Si tiene una emergencia mdica, por favor llame inmediatamente al 911 o vaya a la sala de emergencias.  Nmeros de bper  - Dr. Kowalski: [phone]  - Dra. Moye: [phone]  - Dra. Stewart: [phone]  En caso de inclemencias del tiempo, por favor llame a nuestra lnea principal al [phone] para una actualizacin sobre el estado de cualquier retraso o cierre.  Consejos para la medicacin en dermatologa: Por  favor, guarde las cajas en las que vienen los medicamentos de uso tpico para ayudarle a seguir las instrucciones sobre dnde y cmo usarlos. Las farmacias generalmente imprimen las instrucciones del medicamento slo en las cajas y no directamente en los tubos del medicamento.   Si su medicamento es muy caro, por favor, pngase en contacto con nuestra oficina llamando al [phone] y presione la opcin 4 o envenos un mensaje a travs de MyChart.   No podemos decirle cul ser su copago por los medicamentos por adelantado ya que esto es diferente dependiendo de la cobertura de su seguro. Sin embargo, es posible que podamos encontrar un medicamento sustituto a menor costo o llenar un formulario para que el seguro cubra el medicamento que se considera necesario.   Si se requiere una autorizacin previa para que su compaa de seguros cubra su medicamento, por favor permtanos de 1 a 2 das hbiles para completar este proceso.  Los precios de los medicamentos varan con frecuencia dependiendo del lugar de dnde se surte la receta y alguna farmacias pueden ofrecer precios ms baratos.  El sitio web www.goodrx.com tiene cupones para medicamentos de diferentes farmacias. Los precios aqu no tienen en cuenta lo que podra costar con la ayuda del seguro (puede ser ms barato con su seguro), pero el sitio web puede darle el precio si no utiliz ningn seguro.  - Puede imprimir el cupn correspondiente y llevarlo con su receta a la farmacia.  - Tambin puede pasar por nuestra oficina durante el horario de atencin regular y recoger una tarjeta de cupones de GoodRx.  - Si necesita que su receta se enve electrnicamente a una farmacia diferente, informe a nuestra oficina a travs de MyChart de [place] o por telfono llamando al [phone] y presione la opcin 4.  

## 2022-11-10 ENCOUNTER — Encounter: Payer: Self-pay | Admitting: Dermatology

## 2022-11-13 ENCOUNTER — Encounter: Payer: Self-pay | Admitting: Dermatology

## 2022-11-16 ENCOUNTER — Telehealth: Payer: Self-pay

## 2022-11-16 NOTE — Telephone Encounter (Signed)
-----   Message from Ralene Bathe, MD sent at 11/16/2022  5:06 PM EST ----- Diagnosis Skin , left post shoulder SUPERFICIAL BASAL CELL CARCINOMA  Cancer - BCC Superficial Already treated Recheck next visit

## 2022-11-16 NOTE — Telephone Encounter (Signed)
Patient advised pathology showed BCC, already treated. Will recheck on follow up.  Lurlean Horns., RMA

## 2022-12-13 ENCOUNTER — Telehealth: Payer: Self-pay | Admitting: Family Medicine

## 2022-12-13 NOTE — Telephone Encounter (Signed)
Contacted Kevin Campbell to schedule their annual wellness visit. Appointment made for 12/20/2022.  [place] [facility] Dial [phone]

## 2022-12-17 ENCOUNTER — Ambulatory Visit: Payer: PPO

## 2022-12-17 ENCOUNTER — Ambulatory Visit: Payer: Self-pay

## 2022-12-20 ENCOUNTER — Ambulatory Visit (INDEPENDENT_AMBULATORY_CARE_PROVIDER_SITE_OTHER): Payer: PPO

## 2022-12-20 VITALS — Wt 231.0 lb

## 2022-12-20 DIAGNOSIS — Z0000 Encounter for general adult medical examination without abnormal findings: Secondary | ICD-10-CM | POA: Diagnosis not present

## 2022-12-20 DIAGNOSIS — Z1211 Encounter for screening for malignant neoplasm of colon: Secondary | ICD-10-CM | POA: Diagnosis not present

## 2022-12-20 NOTE — Patient Instructions (Signed)
Kevin Campbell , Thank you for taking time to come for your Medicare Wellness Visit. I appreciate your ongoing commitment to your health goals. Please review the following plan we discussed and let me know if I can assist you in the future.   These are the goals we discussed:  Goals      Patient Stated     Starting 05/22/2018, I will continue to take medications as prescribed.      Patient Stated     06/03/2020, I will maintain and continue medications as prescribed.      Patient Stated     Would like to start exercising       Patient Stated     Keep weight down         This is a list of the screening recommended for you and due dates:  Health Maintenance  Topic Date Due   COVID-19 Vaccine (1) Never done   Zoster (Shingles) Vaccine (1 of 2) Never done   DTaP/Tdap/Td vaccine (2 - Tdap) 10/17/2016   Colon Cancer Screening  04/20/2020   Eye exam for diabetics  08/11/2020   Yearly kidney function blood test for diabetes  01/07/2023   Yearly kidney health urinalysis for diabetes  01/07/2023   Complete foot exam   03/13/2023   Hemoglobin A1C  03/24/2023   Medicare Annual Wellness Visit  12/20/2023   Pneumonia Vaccine  Completed   Flu Shot  Completed   Hepatitis C Screening: USPSTF Recommendation to screen - Ages 18-75 yo.  Completed   HPV Vaccine  Aged Out 75    Advanced directives: Please bring a copy of your health care power of attorney and living will to the office at your convenience.  Conditions/risks identified: keep weight down   Next appointment: Follow up in one year for your annual wellness visit.   Preventive Care 75 Years and Older, Male  Preventive care refers to lifestyle choices and visits with your health care provider that can promote health and wellness. What does preventive care include? A yearly physical exam. This is also called an annual well check. Dental exams once or twice a year. Routine eye exams. Ask your health care provider how often you should  have your eyes checked. Personal lifestyle choices, including: Daily care of your teeth and gums. Regular physical activity. Eating a healthy diet. Avoiding tobacco and drug use. Limiting alcohol use. Practicing safe sex. Taking low doses of aspirin every day. Taking vitamin and mineral supplements as recommended by your health care provider. What happens during an annual well check? The services and screenings done by your health care provider during your annual well check will depend on your age, overall health, lifestyle risk factors, and family history of disease. Counseling  Your health care provider may ask you questions about your: Alcohol use. Tobacco use. Drug use. Emotional well-being. Home and relationship well-being. Sexual activity. Eating habits. History of falls. Memory and ability to understand (cognition). Work and work Statistician. Screening  You may have the following tests or measurements: Height, weight, and BMI. Blood pressure. Lipid and cholesterol levels. These may be checked every 5 years, or more frequently if you are over 60 years old. Skin check. Lung cancer screening. You may have this screening every year starting at age 75 if you have a 30-pack-year history of smoking and currently smoke or have quit within the past 15 years. Fecal occult blood test (FOBT) of the stool. You may have this test every year starting at  age 25. Flexible sigmoidoscopy or colonoscopy. You may have a sigmoidoscopy every 5 years or a colonoscopy every 10 years starting at age 17. Prostate cancer screening. Recommendations will vary depending on your family history and other risks. Hepatitis C blood test. Hepatitis B blood test. Sexually transmitted disease (STD) testing. Diabetes screening. This is done by checking your blood sugar (glucose) after you have not eaten for a while (fasting). You may have this done every 1-3 years. Abdominal aortic aneurysm (AAA) screening. You  may need this if you are a current or former smoker. Osteoporosis. You may be screened starting at age 75 if you are at high risk. Talk with your health care provider about your test results, treatment options, and if necessary, the need for more tests. Vaccines  Your health care provider may recommend certain vaccines, such as: Influenza vaccine. This is recommended every year. Tetanus, diphtheria, and acellular pertussis (Tdap, Td) vaccine. You may need a Td booster every 10 years. Zoster vaccine. You may need this after age 49. Pneumococcal 13-valent conjugate (PCV13) vaccine. One dose is recommended after age 75. Pneumococcal polysaccharide (PPSV23) vaccine. One dose is recommended after age 75. Talk to your health care provider about which screenings and vaccines you need and how often you need them. This information is not intended to replace advice given to you by your health care provider. Make sure you discuss any questions you have with your health care provider. Document Released: 10/24/2015 Document Revised: 06/16/2016 Document Reviewed: 07/29/2015 Elsevier Interactive Patient Education  2017 [place] Prevention in the Home Falls can cause injuries. They can happen to people of all ages. There are many things you can do to make your home safe and to help prevent falls. What can I do on the outside of my home? Regularly fix the edges of walkways and driveways and fix any cracks. Remove anything that might make you trip as you walk through a door, such as a raised step or threshold. Trim any bushes or trees on the path to your home. Use bright outdoor lighting. Clear any walking paths of anything that might make someone trip, such as rocks or tools. Regularly check to see if handrails are loose or broken. Make sure that both sides of any steps have handrails. Any raised decks and porches should have guardrails on the edges. Have any leaves, snow, or ice cleared  regularly. Use sand or salt on walking paths during winter. Clean up any spills in your garage right away. This includes oil or grease spills. What can I do in the bathroom? Use night lights. Install grab bars by the toilet and in the tub and shower. Do not use towel bars as grab bars. Use non-skid mats or decals in the tub or shower. If you need to sit down in the shower, use a plastic, non-slip stool. Keep the floor dry. Clean up any water that spills on the floor as soon as it happens. Remove soap buildup in the tub or shower regularly. Attach bath mats securely with double-sided non-slip rug tape. Do not have throw rugs and other things on the floor that can make you trip. What can I do in the bedroom? Use night lights. Make sure that you have a light by your bed that is easy to reach. Do not use any sheets or blankets that are too big for your bed. They should not hang down onto the floor. Have a firm chair that has side arms. You can  use this for support while you get dressed. Do not have throw rugs and other things on the floor that can make you trip. What can I do in the kitchen? Clean up any spills right away. Avoid walking on wet floors. Keep items that you use a lot in easy-to-reach places. If you need to reach something above you, use a strong step stool that has a grab bar. Keep electrical cords out of the way. Do not use floor polish or wax that makes floors slippery. If you must use wax, use non-skid floor wax. Do not have throw rugs and other things on the floor that can make you trip. What can I do with my stairs? Do not leave any items on the stairs. Make sure that there are handrails on both sides of the stairs and use them. Fix handrails that are broken or loose. Make sure that handrails are as long as the stairways. Check any carpeting to make sure that it is firmly attached to the stairs. Fix any carpet that is loose or worn. Avoid having throw rugs at the top or  bottom of the stairs. If you do have throw rugs, attach them to the floor with carpet tape. Make sure that you have a light switch at the top of the stairs and the bottom of the stairs. If you do not have them, ask someone to add them for you. What else can I do to help prevent falls? Wear shoes that: Do not have high heels. Have rubber bottoms. Are comfortable and fit you well. Are closed at the toe. Do not wear sandals. If you use a stepladder: Make sure that it is fully opened. Do not climb a closed stepladder. Make sure that both sides of the stepladder are locked into place. Ask someone to hold it for you, if possible. Clearly mark and make sure that you can see: Any grab bars or handrails. First and last steps. Where the edge of each step is. Use tools that help you move around (mobility aids) if they are needed. These include: Canes. Walkers. Scooters. Crutches. Turn on the lights when you go into a dark area. Replace any light bulbs as soon as they burn out. Set up your furniture so you have a clear path. Avoid moving your furniture around. If any of your floors are uneven, fix them. If there are any pets around you, be aware of where they are. Review your medicines with your doctor. Some medicines can make you feel dizzy. This can increase your chance of falling. Ask your doctor what other things that you can do to help prevent falls. This information is not intended to replace advice given to you by your health care provider. Make sure you discuss any questions you have with your health care provider. Document Released: 07/24/2009 Document Revised: 03/04/2016 Document Reviewed: 11/01/2014 Elsevier Interactive Patient Education  2017 [employer].

## 2022-12-20 NOTE — Addendum Note (Signed)
Addended by: Willette Brace on: 12/20/2022 09:28 AM   Modules accepted: Orders

## 2022-12-20 NOTE — Progress Notes (Addendum)
I connected with  Sindy Messing on 12/20/22 by a audio enabled telemedicine application and verified that I am speaking with the correct person using two identifiers.  Patient Location: Home  Provider Location: Office/Clinic  I discussed the limitations of evaluation and management by telemedicine. The patient expressed understanding and agreed to proceed.   Subjective:   MICHAELANDREW ANEZ is a 77 y.o. male who presents for Medicare Annual/Subsequent preventive examination.  Review of Systems     Cardiac Risk Factors include: advanced age (>28mn, >>69women);hypertension;dyslipidemia;diabetes mellitus;obesity (BMI >30kg/m2);male gender     Objective:    Today's Vitals   12/20/22 0829  Weight: 231 lb (104.8 kg)   Body mass index is 37.28 kg/m.     12/20/2022    8:34 AM 12/14/2021    9:03 AM 06/03/2020   10:32 AM 05/22/2018    9:18 AM 05/10/2017    2:03 PM 02/16/2013    9:09 AM 09/26/2012    4:00 PM  Advanced Directives  Does Patient Have a Medical Advance Directive? Yes No No No No Patient does not have advance directive Patient does not have advance directive;Patient would not like information  Type of AScientist, forensicPower of [place] will        Copy of [place] Chart? No - copy requested        Would patient like information on creating a medical advance directive?  Yes (MAU/Ambulatory/Procedural Areas - Information given) Yes (MAU/Ambulatory/Procedural Areas - Information given) Yes (MAU/Ambulatory/Procedural Areas - Information given) Yes (MAU/Ambulatory/Procedural Areas - Information given)    Pre-existing out of facility DNR order (yellow form or pink MOST form)      No No    Current Medications (verified) Outpatient Encounter Medications as of 12/20/2022  Medication Sig   amLODipine (NORVASC) 5 MG tablet Take 1 tablet (5 mg total) by mouth daily.   aspirin EC 81 MG tablet Take 81 mg by mouth daily.   Blood Glucose Monitoring  Suppl (ONETOUCH VERIO REFLECT) w/Device KIT Use as instructed to check blood sugar once a day   cyanocobalamin (VITAMIN B12) 1000 MCG tablet Take 1 tablet (1,000 mcg total) by mouth daily.   gabapentin (NEURONTIN) 300 MG capsule Take 300 mg by mouth at bedtime.   glucose blood (ONETOUCH VERIO) test strip Use as instructed to check blood sugar once a day   Lancets (ONETOUCH DELICA PLUS [id number] MISC Use as directed to check blood sugar once a day   mupirocin ointment (BACTROBAN) 2 % Apply 1 Application topically daily. Qd to excision site   omeprazole (PRILOSEC) 40 MG capsule Take 1 capsule (40 mg total) by mouth daily. Take daily for 2 weeks then as needed   VITAMIN D PO Take by mouth. Vit d 3   No facility-administered encounter medications on file as of 12/20/2022.    Allergies (verified) Benadryl [diphenhydramine hcl], Celebrex [celecoxib], Codeine, Flomax [tamsulosin hcl], and Hydrocodone   History: Past Medical History:  Diagnosis Date   Allergy    Basal cell carcinoma 09/13/2022   right upper back, Excised 11/02/22   BCC (basal cell carcinoma) 11/09/2022   left posterior shoulder, EDC   BPH (benign prostatic hypertrophy)    Carpal tunnel syndrome on both sides    Cataract 05/22/2018   bilateral eyes   Colon polyps 2011   [name]   GERD (gastroesophageal reflux disease)    watches diet   Hiatal hernia    Hypertension    Internal hemorrhoids  Nocturia    Obstructive sleep apnea    Overactive bladder    due to prostate seed implant radiation, failed myrbetriq, rapaflo, jalyn, now PTNS (McDiarmid)   Presbyesophagus    Prostate cancer ([place]) 08/24/2012   Adenocarcinoma,gleason=3+4=7,& 3+3=6,PSA=5.75,volume=23cc(5/12)cores positive; treated with XRT and radioactive seed implant, doing well ([place])   Rheumatoid arthritis ([place])    of hands by Xray but RF, CCP, CRP negative (12/2013)   Seasonal allergies    Sleep apnea    pt did wear face mask with CPAP - but not  tolerating mask now - not wearing C-PAP   Past Surgical History:  Procedure Laterality Date   CARDIOVASCULAR STRESS TEST  11-26-2010   Normal lexiscan nuclear study with inferior thinning but no ischemia   CLAVICLE SURGERY Left 1968   REMOVAL OF BONE SPUR FROM PREVIOUS FX YEARS AGO   COLONOSCOPY  2011   1 polyp sigmoid (hyperplastic), diverticulosis, int hemorrhoids [facility])   KNEE ARTHROSCOPY Left JAN 2013   KNEE ARTHROSCOPY Right 12/2017   Dr. [facility]   PROSTATE BIOPSY  08/24/12   Adenocarcinoma   RADIOACTIVE SEED IMPLANT N/A 02/16/2013   Procedure: RADIOACTIVE PROSTATE SEED IMPLANT;  Surgeon: Claybon Jabs, MD   SHOULDER ARTHROSCOPY WITH BICEPS TENDON REPAIR Right APRIL 2013   TOTAL KNEE ARTHROPLASTY  09/26/2012   Procedure: TOTAL KNEE ARTHROPLASTY;  Surgeon: Sydnee Cabal, MD;  Location: [facility];  Service: Orthopedics;  Laterality: Left;   Family History  Problem Relation Age of Onset   Coronary artery disease Mother 60       3vCABG   Arthritis Mother    Rheumatologic disease Mother    CAD Mother    Cancer Father        lung, smoker   Cancer Sister        twin sister - breast   Stroke Neg Hx    Diabetes Neg Hx    Colon cancer Neg Hx    Rectal cancer Neg Hx    Stomach cancer Neg Hx    Social History   Socioeconomic History   Marital status: Widowed    Spouse name: Not on file   Number of children: Not on file   Years of education: Not on file   Highest education level: Not on file  Occupational History   Not on file  Tobacco Use   Smoking status: Former    Types: Cigars    Quit date: 10/11/1990    Years since quitting: 32.2   Smokeless tobacco: Never   Tobacco comments:    usually  chewed on cigars  Vaping Use   Vaping Use: Never used  Substance and Sexual Activity   Alcohol use: No   Drug use: No   Sexual activity: Not Currently  Other Topics Concern   Not on file  Social History Narrative   Caffeine: 2 cups coffee  occasionally, soda throughout the day   Lives with wife, no pets.  2 grown children   Occupation: truck driver   Edu: HS   Activity: sedentary   Diet: rare water, good fruits and vegetables   Social Determinants of Health   Financial Resource Strain: Low Risk  (12/20/2022)   Overall Financial Resource Strain (CARDIA)    Difficulty of Paying Living Expenses: Not hard at all  Food Insecurity: No Food Insecurity (12/20/2022)   Hunger Vital Sign    Worried About Running Out of Food in the Last Year: Never true    Ran Out  of Food in the Last Year: Never true  Transportation Needs: No Transportation Needs (12/20/2022)   PRAPARE - Hydrologist (Medical): No    Lack of Transportation (Non-Medical): No  Physical Activity: Inactive (12/20/2022)   Exercise Vital Sign    Days of Exercise per Week: 0 days    Minutes of Exercise per Session: 0 min  Stress: No Stress Concern Present (12/20/2022)   [place]    Feeling of Stress : Not at all  Social Connections: Socially Isolated (12/20/2022)   Social Connection and Isolation Panel [NHANES]    Frequency of Communication with Friends and Family: More than three times a week    Frequency of Social Gatherings with Friends and Family: More than three times a week    Attends Religious Services: Never    Marine scientist or Organizations: No    Attends Archivist Meetings: Never    Marital Status: Widowed    Tobacco Counseling Counseling given: Not Answered Tobacco comments: usually  chewed on cigars   Clinical Intake:  Pre-visit preparation completed: Yes  Pain : No/denies pain     BMI - recorded: 37.28 Nutritional Status: BMI > 30  Obese Nutritional Risks: None Diabetes: Yes CBG done?: No CBG resulted in Enter/ Edit results?: No Did pt. bring in CBG monitor from home?: No  How often do you need to have someone help you when  you read instructions, pamphlets, or other written materials from your doctor or pharmacy?: 1 - Never  Diabetic?Nutrition Risk Assessment:  Has the patient had any N/V/D within the last 2 months?  No  Does the patient have any non-healing wounds?  No  Has the patient had any unintentional weight loss or weight gain?  No   Diabetes:  Is the patient diabetic?  Yes  If diabetic, was a CBG obtained today?  No  Did the patient bring in their glucometer from home?  No  How often do you monitor your CBG's? N/A.   Financial Strains and Diabetes Management:  Are you having any financial strains with the device, your supplies or your medication? No .  Does the patient want to be seen by Chronic Care Management for management of their diabetes?  No  Would the patient like to be referred to a Nutritionist or for Diabetic Management?  No   Diabetic Exams:  Diabetic Eye Exam: Overdue for diabetic eye exam. Pt has been advised about the importance in completing this exam. Patient advised to call and schedule an eye exam. Diabetic Foot Exam: Completed 03/12/22   Interpreter Needed?: No  Information entered by :: Charlott Rakes, LPN   Activities of Daily Living    12/20/2022    8:35 AM  In your present state of health, do you have any difficulty performing the following activities:  Hearing? 0  Vision? 0  Difficulty concentrating or making decisions? 0  Walking or climbing stairs? 0  Dressing or bathing? 0  Doing errands, shopping? 0  Preparing Food and eating ? N  Using the Toilet? N  In the past six months, have you accidently leaked urine? Y  Comment at times at night  Do you have problems with loss of bowel control? N  Managing your Medications? N  Managing your Finances? N  Housekeeping or managing your Housekeeping? N    Patient Care Team: Ria Bush, MD as PCP - General (Family Medicine)  Indicate  any recent Medical Services you may have received from other than [facility]  providers in the past year (date may be approximate).     Assessment:   This is a routine wellness examination for [place].  Hearing/Vision screen Hearing Screening - Comments:: Pt denies any hearing issues  Vision Screening - Comments:: Pt follows up with [facility]   Dietary issues and exercise activities discussed: Current Exercise Habits: The patient does not participate in regular exercise at present   Goals Addressed             This Visit's Progress    Patient Stated       Keep weight down        Depression Screen    12/20/2022    8:33 AM 12/14/2021    9:07 AM 06/03/2020   10:34 AM 05/30/2019   11:24 AM 05/30/2019   10:29 AM 05/22/2018    9:15 AM 05/10/2017    2:05 PM  PHQ 2/9 Scores  PHQ - 2 Score 0 0 0 0 0 0 0  PHQ- 9 Score   0 1  0     Fall Risk    12/20/2022    8:35 AM 12/14/2021    9:06 AM 06/03/2020   10:34 AM 05/30/2019   10:29 AM 05/22/2018    9:15 AM  Fall Risk   Falls in the past year? 0 0 0 0 Yes  Comment     accidental fall off lawn mower; injury to right rib area  Number falls in past yr: 0 0 0  1  Injury with Fall? 0 0 0  Yes  Risk for fall due to : Impaired vision  Medication side effect    Follow up Falls prevention discussed Falls prevention discussed Falls evaluation completed;Falls prevention discussed      FALL RISK PREVENTION PERTAINING TO THE HOME:  Any stairs in or around the home? Yes  If so, are there any without handrails? Yes  Home free of loose throw rugs in walkways, pet beds, electrical cords, etc? Yes  Adequate lighting in your home to reduce risk of falls? Yes   ASSISTIVE DEVICES UTILIZED TO PREVENT FALLS:  Life alert? No  Use of a cane, walker or w/c? No  Grab bars in the bathroom? Yes  Shower chair or bench in shower? No  Elevated toilet seat or a handicapped toilet? No   TIMED UP AND GO:  Was the test performed? No .   Cognitive Function:    06/03/2020   10:36 AM 05/22/2018    9:18 AM 05/10/2017    2:07  PM  MMSE - Mini Mental State Exam  Orientation to time '5 5 5  '$ Orientation to Place '5 5 5  '$ Registration '3 3 3  '$ Attention/ Calculation 5 0 0  Recall '3 3 3  '$ Language- name 2 objects  0 0  Language- repeat '1 1 1  '$ Language- follow 3 step command  3 3  Language- read & follow direction  0 0  Write a sentence  0 0  Copy design  0 0  Total score  20 20        12/20/2022    8:36 AM  6CIT Screen  What Year? 0 points  What month? 0 points  What time? 0 points  Count back from 20 0 points  Months in reverse 0 points  Repeat phrase 0 points  Total Score 0 points    Immunizations Immunization History  Administered Date(s)  Administered   Fluad Quad(high Dose 65+) 06/23/2022   Influenza Whole 06/22/2012   Influenza, High Dose Seasonal PF 08/01/2017   Influenza-Unspecified 07/08/2014, 07/12/2016   Pneumococcal Conjugate-13 05/22/2018   Pneumococcal Polysaccharide-23 10/18/2011, 05/30/2019   Td 10/17/2006    TDAP status: Due, Education has been provided regarding the importance of this vaccine. Advised may receive this vaccine at local pharmacy or Health Dept. Aware to provide a copy of the vaccination record if obtained from local pharmacy or Health Dept. Verbalized acceptance and understanding.  Flu Vaccine status: Up to date  Pneumococcal vaccine status: Up to date  Covid-19 vaccine status: Declined, Education has been provided regarding the importance of this vaccine but patient still declined. Advised may receive this vaccine at local pharmacy or Health Dept.or vaccine clinic. Aware to provide a copy of the vaccination record if obtained from local pharmacy or Health Dept. Verbalized acceptance and understanding.  Qualifies for Shingles Vaccine? Yes   Zostavax completed No   Shingrix Completed?: No.    Education has been provided regarding the importance of this vaccine. Patient has been advised to call insurance company to determine out of pocket expense if they have not yet  received this vaccine. Advised may also receive vaccine at local pharmacy or Health Dept. Verbalized acceptance and understanding.  Screening Tests Health Maintenance  Topic Date Due   COVID-19 Vaccine (1) Never done   Zoster Vaccines- Shingrix (1 of 2) Never done   DTaP/Tdap/Td (2 - Tdap) 10/17/2016   COLONOSCOPY (Pts 45-75yr Insurance coverage will need to be confirmed)  04/20/2020   OPHTHALMOLOGY EXAM  08/11/2020   Diabetic kidney evaluation - eGFR measurement  01/07/2023   Diabetic kidney evaluation - Urine ACR  01/07/2023   FOOT EXAM  03/13/2023   HEMOGLOBIN A1C  03/24/2023   Medicare Annual Wellness (AWV)  12/20/2023   Pneumonia Vaccine 75 Years old  Completed   INFLUENZA VACCINE  Completed   Hepatitis C Screening  Completed   HPV VACCINES  Aged Out    Health Maintenance  Health Maintenance Due  Topic Date Due   COVID-19 Vaccine (1) Never done   Zoster Vaccines- Shingrix (1 of 2) Never done   DTaP/Tdap/Td (2 - Tdap) 10/17/2016   COLONOSCOPY (Pts 45-449yrInsurance coverage will need to be confirmed)  04/20/2020   OPHTHALMOLOGY EXAM  08/11/2020   Diabetic kidney evaluation - eGFR measurement  01/07/2023   Diabetic kidney evaluation - Urine ACR  01/07/2023    Colorectal cancer screening: Referral to GI placed 12/20/22. Pt aware the office will call re: appt.   Additional Screening: Hepatitis C Screening: Completed 05/10/17  Vision Screening: Recommended annual ophthalmology exams for early detection of glaucoma and other disorders of the eye. Is the patient up to date with their annual eye exam?  No  Who is the provider or what is the name of the office in which the patient attends annual eye exams? [facility]  If pt is not established with a provider, would they like to be referred to a provider to establish care? No .   Dental Screening: Recommended annual dental exams for proper oral hygiene  Community Resource Referral / Chronic Care Management: CRR  required this visit?  No   CCM required this visit?  No      Plan:     I have personally reviewed and noted the following in the patient's chart:   Medical and social history Use of alcohol, tobacco or illicit drugs  Current  medications and supplements including opioid prescriptions. Patient is not currently taking opioid prescriptions. Functional ability and status Nutritional status Physical activity Advanced directives List of other physicians Hospitalizations, surgeries, and ER visits in previous 12 months Vitals Screenings to include cognitive, depression, and falls Referrals and appointments  In addition, I have reviewed and discussed with patient certain preventive protocols, quality metrics, and best practice recommendations. A written personalized care plan for preventive services as well as general preventive health recommendations were provided to patient.     Willette Brace, LPN   [phone]   Nurse Notes: none

## 2022-12-26 ENCOUNTER — Other Ambulatory Visit: Payer: Self-pay | Admitting: Family Medicine

## 2022-12-26 DIAGNOSIS — N183 Chronic kidney disease, stage 3 unspecified: Secondary | ICD-10-CM

## 2022-12-26 DIAGNOSIS — E559 Vitamin D deficiency, unspecified: Secondary | ICD-10-CM

## 2022-12-26 DIAGNOSIS — E1169 Type 2 diabetes mellitus with other specified complication: Secondary | ICD-10-CM

## 2022-12-26 DIAGNOSIS — E538 Deficiency of other specified B group vitamins: Secondary | ICD-10-CM

## 2022-12-26 DIAGNOSIS — R7303 Prediabetes: Secondary | ICD-10-CM

## 2022-12-28 ENCOUNTER — Other Ambulatory Visit (INDEPENDENT_AMBULATORY_CARE_PROVIDER_SITE_OTHER): Payer: PPO

## 2022-12-28 DIAGNOSIS — R7303 Prediabetes: Secondary | ICD-10-CM

## 2022-12-28 DIAGNOSIS — E1169 Type 2 diabetes mellitus with other specified complication: Secondary | ICD-10-CM | POA: Diagnosis not present

## 2022-12-28 DIAGNOSIS — E538 Deficiency of other specified B group vitamins: Secondary | ICD-10-CM

## 2022-12-28 DIAGNOSIS — N183 Chronic kidney disease, stage 3 unspecified: Secondary | ICD-10-CM | POA: Diagnosis not present

## 2022-12-28 DIAGNOSIS — E559 Vitamin D deficiency, unspecified: Secondary | ICD-10-CM | POA: Diagnosis not present

## 2022-12-28 DIAGNOSIS — E785 Hyperlipidemia, unspecified: Secondary | ICD-10-CM | POA: Diagnosis not present

## 2022-12-28 LAB — COMPREHENSIVE METABOLIC PANEL
ALT: 14 U/L (ref 0–53)
AST: 15 U/L (ref 0–37)
Albumin: 3.7 g/dL (ref 3.5–5.2)
Alkaline Phosphatase: 63 U/L (ref 39–117)
BUN: 15 mg/dL (ref 6–23)
CO2: 24 mEq/L (ref 19–32)
Calcium: 9.3 mg/dL (ref 8.4–10.5)
Chloride: 105 mEq/L (ref 96–112)
Creatinine, Ser: 1.41 mg/dL (ref 0.40–1.50)
GFR: 49.15 mL/min — ABNORMAL LOW (ref >60.00)
Glucose, Bld: 117 mg/dL — ABNORMAL HIGH (ref 70–99)
Potassium: 4.3 mEq/L (ref 3.5–5.1)
Sodium: 139 mEq/L (ref 135–145)
Total Bilirubin: 0.7 mg/dL (ref 0.2–1.2)
Total Protein: 6.5 g/dL (ref 6.0–8.3)

## 2022-12-28 LAB — MICROALBUMIN / CREATININE URINE RATIO
Creatinine,U: 160.2 mg/dL
Microalb Creat Ratio: 0.5 mg/g (ref 0.0–30.0)
Microalb, Ur: 0.7 mg/dL (ref 0.0–1.9)

## 2022-12-28 LAB — CBC WITH DIFFERENTIAL/PLATELET
Basophils Absolute: 0.1 10*3/uL (ref 0.0–0.1)
Basophils Relative: 0.8 % (ref 0.0–3.0)
Eosinophils Absolute: 0.3 10*3/uL (ref 0.0–0.7)
Eosinophils Relative: 4.2 % (ref 0.0–5.0)
HCT: 45.8 % (ref 39.0–52.0)
Hemoglobin: 15.8 g/dL (ref 13.0–17.0)
Lymphocytes Relative: 33 % (ref 12.0–46.0)
Lymphs Abs: 2.2 10*3/uL (ref 0.7–4.0)
MCHC: 34.6 g/dL (ref 30.0–36.0)
MCV: 87.4 fl (ref 78.0–100.0)
Monocytes Absolute: 0.7 10*3/uL (ref 0.1–1.0)
Monocytes Relative: 10.2 % (ref 3.0–12.0)
Neutro Abs: 3.5 10*3/uL (ref 1.4–7.7)
Neutrophils Relative %: 51.8 % (ref 43.0–77.0)
Platelets: 204 10*3/uL (ref 150.0–400.0)
RBC: 5.24 Mil/uL (ref 4.22–5.81)
RDW: 13.5 % (ref 11.5–15.5)
WBC: 6.8 10*3/uL (ref 4.0–10.5)

## 2022-12-28 LAB — LIPID PANEL
Cholesterol: 175 mg/dL (ref 0–200)
HDL: 29.2 mg/dL — ABNORMAL LOW (ref >39.00)
LDL Cholesterol: 116 mg/dL — ABNORMAL HIGH (ref 0–99)
NonHDL: 146.2
Total CHOL/HDL Ratio: 6
Triglycerides: 153 mg/dL — ABNORMAL HIGH (ref 0.0–149.0)
VLDL: 30.6 mg/dL (ref 0.0–40.0)

## 2022-12-28 LAB — VITAMIN D 25 HYDROXY (VIT D DEFICIENCY, FRACTURES): VITD: 16.41 ng/mL — ABNORMAL LOW (ref 30.00–100.00)

## 2022-12-28 LAB — PHOSPHORUS: Phosphorus: 3 mg/dL (ref 2.3–4.6)

## 2022-12-28 LAB — VITAMIN B12: Vitamin B-12: 188 pg/mL — ABNORMAL LOW (ref 211–911)

## 2022-12-28 LAB — HEMOGLOBIN A1C: Hgb A1c MFr Bld: 6.3 % (ref 4.6–6.5)

## 2022-12-29 LAB — PARATHYROID HORMONE, INTACT (NO CA): PTH: 58 pg/mL (ref 16–77)

## 2022-12-30 DIAGNOSIS — Z961 Presence of intraocular lens: Secondary | ICD-10-CM | POA: Diagnosis not present

## 2022-12-30 DIAGNOSIS — H02831 Dermatochalasis of right upper eyelid: Secondary | ICD-10-CM | POA: Diagnosis not present

## 2022-12-30 LAB — HM DIABETES EYE EXAM

## 2023-01-04 ENCOUNTER — Ambulatory Visit (INDEPENDENT_AMBULATORY_CARE_PROVIDER_SITE_OTHER): Payer: PPO | Admitting: Family Medicine

## 2023-01-04 ENCOUNTER — Encounter: Payer: Self-pay | Admitting: Family Medicine

## 2023-01-04 VITALS — BP 130/78 | HR 68 | Temp 97.4°F | Ht 66.5 in | Wt 230.1 lb

## 2023-01-04 DIAGNOSIS — Z0000 Encounter for general adult medical examination without abnormal findings: Secondary | ICD-10-CM | POA: Diagnosis not present

## 2023-01-04 DIAGNOSIS — N1831 Chronic kidney disease, stage 3a: Secondary | ICD-10-CM | POA: Diagnosis not present

## 2023-01-04 DIAGNOSIS — Z7189 Other specified counseling: Secondary | ICD-10-CM | POA: Diagnosis not present

## 2023-01-04 DIAGNOSIS — M06041 Rheumatoid arthritis without rheumatoid factor, right hand: Secondary | ICD-10-CM | POA: Diagnosis not present

## 2023-01-04 DIAGNOSIS — E785 Hyperlipidemia, unspecified: Secondary | ICD-10-CM

## 2023-01-04 DIAGNOSIS — I6523 Occlusion and stenosis of bilateral carotid arteries: Secondary | ICD-10-CM

## 2023-01-04 DIAGNOSIS — M06042 Rheumatoid arthritis without rheumatoid factor, left hand: Secondary | ICD-10-CM

## 2023-01-04 DIAGNOSIS — E538 Deficiency of other specified B group vitamins: Secondary | ICD-10-CM

## 2023-01-04 DIAGNOSIS — C61 Malignant neoplasm of prostate: Secondary | ICD-10-CM | POA: Diagnosis not present

## 2023-01-04 DIAGNOSIS — E1169 Type 2 diabetes mellitus with other specified complication: Secondary | ICD-10-CM

## 2023-01-04 DIAGNOSIS — K219 Gastro-esophageal reflux disease without esophagitis: Secondary | ICD-10-CM | POA: Diagnosis not present

## 2023-01-04 DIAGNOSIS — R7303 Prediabetes: Secondary | ICD-10-CM

## 2023-01-04 DIAGNOSIS — E559 Vitamin D deficiency, unspecified: Secondary | ICD-10-CM

## 2023-01-04 DIAGNOSIS — I10 Essential (primary) hypertension: Secondary | ICD-10-CM

## 2023-01-04 MED ORDER — AMLODIPINE BESYLATE 5 MG PO TABS: 5.0000 mg | ORAL_TABLET | Freq: Every day | ORAL | 4 refills | Status: DC

## 2023-01-04 MED ORDER — ATORVASTATIN CALCIUM 20 MG PO TABS: 20.0000 mg | ORAL_TABLET | Freq: Every day | ORAL | 4 refills | Status: DC

## 2023-01-04 MED ORDER — VITAMIN D3 1.25 MG (50000 UT) PO TABS: 1.0000 | ORAL_TABLET | ORAL | 1 refills | Status: DC

## 2023-01-04 MED ORDER — CYANOCOBALAMIN 1000 MCG/ML IJ SOLN
1000.0000 ug | Freq: Once | INTRAMUSCULAR | Status: AC
  Administered 2023-01-04: 1000 ug via INTRAMUSCULAR

## 2023-01-04 NOTE — Assessment & Plan Note (Signed)
S/p seed implant 2013. Stable period with PSA zero - will need this updated with next labs.

## 2023-01-04 NOTE — Assessment & Plan Note (Signed)
Chronic, not on statin - rec restart atorvastatin 20mg  daily.  The 10-year ASCVD risk score (Arnett DK, et al., 2019) is: 59.7%   Values used to calculate the score:     Age: 77 years     Sex: Male     Is Non-Hispanic African American: No     Diabetic: Yes     Tobacco smoker: No     Systolic Blood Pressure: 123456 mmHg     Is BP treated: Yes     HDL Cholesterol: 29.2 mg/dL     Total Cholesterol: 175 mg/dL

## 2023-01-04 NOTE — Assessment & Plan Note (Signed)
Stable period, reviewed limiting sugars and carbs

## 2023-01-04 NOTE — Patient Instructions (Addendum)
If interested, check with pharmacy about new 2 shot shingles series (shingrix).  Bring [place] a copy of your living will.  I do recommend you restart atorvastatin 20mg  daily - I've sent this in to the pharmacy.  Start weekly prescription vitamin D 50,000 units weekly sent to pharmacy Start monthly B12 shots through our office, first one today. Schedule monthly x 6 months then we will recheck.  Return in 6 months for diabetes follow up visit

## 2023-01-04 NOTE — Assessment & Plan Note (Signed)
Levels low despite daily replacement - start Rx 50k units weekly

## 2023-01-04 NOTE — Assessment & Plan Note (Signed)
Preventative protocols reviewed and updated unless pt declined. Discussed healthy diet and lifestyle.  

## 2023-01-04 NOTE — Assessment & Plan Note (Signed)
No bruit appreciated.  

## 2023-01-04 NOTE — Progress Notes (Addendum)
Patient ID: Kevin Campbell, male    DOB: 1946-09-13, 77 y.o.   MRN: [id number]  This visit was conducted in person.  BP 130/78 (BP Location: Right Arm, Cuff Size: Large)   Pulse 68   Temp (!) 97.4 F (36.3 C) (Temporal)   Ht 5' 6.5" (1.689 m)   Wt 230 lb 2 oz (104.4 kg)   SpO2 96%   BMI 36.59 kg/m    CC: CPE Subjective:   HPI: Kevin Campbell is a 77 y.o. male presenting on 01/04/2023 for Annual Exam [facility] prt 2 [AWV-12/20/22].)   Saw health advisor earlier this month for medicare wellness visit. Note reviewed.   No results found.  [facility] Visit from 01/04/2023 in [facility] [facility]  PHQ-2 Total Score 0          12/20/2022    8:35 AM 12/14/2021    9:06 AM 06/03/2020   10:34 AM 05/30/2019   10:29 AM 05/22/2018    9:15 AM  Fall Risk   Falls in the past year? 0 0 0 0 Yes  Comment     accidental fall off lawn mower; injury to right rib area  Number falls in past yr: 0 0 0  1  Injury with Fall? 0 0 0  Yes  Risk for fall due to : Impaired vision  Medication side effect    Follow up Falls prevention discussed Falls prevention discussed Falls evaluation completed;Falls prevention discussed     Wife passed away Aug 29, 2020 - he was her caregiver. Daughter lives next door.   BP at home this morning 152/80. Sunday morning 134/72. Takes amlodipine 5mg  daily.   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 Notes ongoing R leg pain, numbness, s/p lumbar ESI Jacelyn Grip). He sotpped gabapentin - didn't really help pain. Takes ibuprofen PRN aches.   Notes intermittent dysphagia, s/p EGD 09/2020 with dilation with benefit.   Diet controlled DM - not checking sugars. Not limiting sugar intake.   H/o seronegative RA - manages with OTC cool gel with benefit.   Preventative: Colonoscopy 2011 - 1 HP, diverticulosis ([place]). Cologard negative 06/2020.  H/o prostate cancer 2013 s/p radioactive seed implant (MacDiarmid) last seen 2018.  Lung cancer screening -  not eligible  Flu shot yearly - missed this year  COVID vaccine - got Pfizer x1 Pneumovax 2013, prevnar-13 2019, pneumovax 05/2019 Td 2008  RSV - discussed  Shingrix - discussed  Advanced directive - has completed. HCPOA would be daughter [name]. Full code but would not want prolonged life support if terminal condition. Asked to bring [place] a copy.  Seat belt use discussed.  Sunscreen use discussed. No changing moles on skin.  Ex smoker - quit 1992  Alcohol - none  Dentist - has not seen recently - new upper plate, implants in bottom 08/2022  Eye exam - sees yearly s/p cataract surgery Fall 2020 ([facility])  Bowel - no recent constipation  Bladder - h/o incontinence completed PTNS without benefit. Nocturia x2-3.    Caffeine: 2 cups coffee occasionally, soda throughout the day Lives with wife, no pets.  2 grown children Occupation: truck driver Edu: HS Activity: sedentary, some yardwork  Diet: some water with yardwork, good fruits and vegetables     Relevant past medical, surgical, family and social history reviewed and updated as indicated. Interim medical history since our last visit reviewed. Allergies and medications reviewed and updated. Outpatient Medications Prior to Visit  Medication Sig Dispense  Refill   aspirin EC 81 MG tablet Take 81 mg by mouth daily.     Blood Glucose Monitoring Suppl (ONETOUCH VERIO REFLECT) w/Device KIT Use as instructed to check blood sugar once a day 1 kit 0   cyanocobalamin (VITAMIN B12) 1000 MCG tablet Take 1 tablet (1,000 mcg total) by mouth daily.     cyanocobalamin (VITAMIN B12) 1000 MCG/ML injection Inject 1 mL (1,000 mcg total) into the muscle every 30 (thirty) days.     glucose blood (ONETOUCH VERIO) test strip Use as instructed to check blood sugar once a day 100 each 3   Lancets (ONETOUCH DELICA PLUS 123XX123) MISC Use as directed to check blood sugar once a day 100 each 3   mupirocin ointment (BACTROBAN) 2 % Apply 1 Application  topically daily. Qd to excision site 22 g 0   omeprazole (PRILOSEC) 40 MG capsule Take 1 capsule (40 mg total) by mouth daily. Take daily for 2 weeks then as needed 30 capsule 1   amLODipine (NORVASC) 5 MG tablet Take 1 tablet (5 mg total) by mouth daily. 90 tablet 3   gabapentin (NEURONTIN) 300 MG capsule Take 300 mg by mouth at bedtime.     VITAMIN D PO Take by mouth. Vit d 3     No facility-administered medications prior to visit.     Per HPI unless specifically indicated in ROS section below Review of Systems  Constitutional:  Negative for activity change, appetite change, chills, fatigue, fever and unexpected weight change.  HENT:  Negative for hearing loss.   Eyes:  Negative for visual disturbance.  Respiratory:  Negative for cough, chest tightness, shortness of breath and wheezing.   Cardiovascular:  Negative for chest pain, palpitations and leg swelling.  Gastrointestinal:  Positive for constipation. Negative for abdominal distention, blood in stool, diarrhea, nausea and vomiting. Abdominal pain: occ. Genitourinary:  Negative for difficulty urinating and hematuria.  Musculoskeletal:  Negative for arthralgias, myalgias and neck pain.  Skin:  Negative for rash.  Neurological:  Negative for dizziness, seizures, syncope and headaches.  Hematological:  Negative for adenopathy. Does not bruise/bleed easily.  Psychiatric/Behavioral:  Negative for dysphoric mood. The patient is not nervous/anxious.     Objective:  BP 130/78 (BP Location: Right Arm, Cuff Size: Large)   Pulse 68   Temp (!) 97.4 F (36.3 C) (Temporal)   Ht 5' 6.5" (1.689 m)   Wt 230 lb 2 oz (104.4 kg)   SpO2 96%   BMI 36.59 kg/m   Wt Readings from Last 3 Encounters:  01/04/23 230 lb 2 oz (104.4 kg)  12/20/22 231 lb (104.8 kg)  09/22/22 231 lb 12.8 oz (105.1 kg)      Physical Exam Vitals and nursing note reviewed.  Constitutional:      General: He is not in acute distress.    Appearance: Normal appearance. He  is well-developed. He is not ill-appearing.  HENT:     Head: Normocephalic and atraumatic.     Right Ear: Hearing, tympanic membrane, ear canal and external ear normal.     Left Ear: Hearing, tympanic membrane, ear canal and external ear normal.     Nose: Nose normal.     Mouth/Throat:     Mouth: Mucous membranes are moist.     Pharynx: Oropharynx is clear. No oropharyngeal exudate or posterior oropharyngeal erythema.  Eyes:     General: No scleral icterus.    Extraocular Movements: Extraocular movements intact.     Conjunctiva/sclera: Conjunctivae normal.  Pupils: Pupils are equal, round, and reactive to light.  Neck:     Thyroid: No thyroid mass or thyromegaly.     Vascular: No carotid bruit.  Cardiovascular:     Rate and Rhythm: Normal rate and regular rhythm.     Pulses: Normal pulses.          Radial pulses are 2+ on the right side and 2+ on the left side.     Heart sounds: Normal heart sounds. No murmur heard. Pulmonary:     Effort: Pulmonary effort is normal. No respiratory distress.     Breath sounds: Normal breath sounds. No wheezing, rhonchi or rales.  Abdominal:     General: Bowel sounds are normal. There is no distension.     Palpations: Abdomen is soft. There is no mass.     Tenderness: There is no abdominal tenderness. There is no guarding or rebound.     Hernia: No hernia is present.  Musculoskeletal:        General: Normal range of motion.     Cervical back: Normal range of motion and neck supple.     Right lower leg: No edema.     Left lower leg: No edema.  Lymphadenopathy:     Cervical: No cervical adenopathy.  Skin:    General: Skin is warm and dry.     Findings: No rash.  Neurological:     General: No focal deficit present.     Mental Status: He is alert and oriented to person, place, and time.  Psychiatric:        Mood and Affect: Mood normal.        Behavior: Behavior normal.        Thought Content: Thought content normal.        Judgment:  Judgment normal.       Results for orders placed or performed in visit on 12/31/22  HM DIABETES EYE EXAM  Result Value Ref Range   HM Diabetic Eye Exam No Retinopathy No Retinopathy    Assessment & Plan:   Problem List Items Addressed This Visit     Healthcare maintenance - Primary (Chronic)    Preventative protocols reviewed and updated unless pt declined. Discussed healthy diet and lifestyle.       Advanced care planning/counseling discussion (Chronic)    Advanced directive - has completed. HCPOA would be daughter [name]. Full code but would not want prolonged life support if terminal condition. Asked to bring [place] a copy.       Dyslipidemia associated with type 2 diabetes mellitus (HCC)    Chronic, not on statin - rec restart atorvastatin 20mg  daily.  The 10-year ASCVD risk score (Arnett DK, et al., 2019) is: 59.7%   Values used to calculate the score:     Age: 31 years     Sex: Male     Is Non-Hispanic African American: No     Diabetic: Yes     Tobacco smoker: No     Systolic Blood Pressure: 123456 mmHg     Is BP treated: Yes     HDL Cholesterol: 29.2 mg/dL     Total Cholesterol: 175 mg/dL       Relevant Medications   atorvastatin (LIPITOR) 20 MG tablet   Severe obesity (BMI 35.0-39.9) with comorbidity ([place])    Encouraged healthy diet and lifestyle choices to affect sustainable weight loss.  Obesity complicated by comorbidities of hypertension, diabetes, dyslipidemia and CKD.  Essential hypertension    BP elevated today despite amlodipine 5mg  daily - but improved on recheck.       Relevant Medications   amLODipine (NORVASC) 5 MG tablet   atorvastatin (LIPITOR) 20 MG tablet   GERD    Stable period without heartburn, not using PPI      CKD (chronic kidney disease) stage 3, GFR 30-59 ml/min (HCC)    Reviewed with patient, encouraged good water inatke.       Prediabetes    Stable period, reviewed limiting sugars and carbs        Primary  prostate adenocarcinoma ([place])    S/p seed implant 2013. Stable period with PSA zero - will need this updated with next labs.       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       Carotid stenosis, asymptomatic, bilateral    No bruit appreciated.      Relevant Medications   amLODipine (NORVASC) 5 MG tablet   atorvastatin (LIPITOR) 20 MG tablet   Vitamin B12 deficiency    Levels low despite daily b12 - will restart monthly injections x6 mo, reassess control at f/u visit.       Vitamin D deficiency    Levels low despite daily replacement - start Rx 50k units weekly        Meds ordered this encounter  Medications   amLODipine (NORVASC) 5 MG tablet    Sig: Take 1 tablet (5 mg total) by mouth daily.    Dispense:  90 tablet    Refill:  4   atorvastatin (LIPITOR) 20 MG tablet    Sig: Take 1 tablet (20 mg total) by mouth daily at 6 PM.    Dispense:  90 tablet    Refill:  4   Cholecalciferol (VITAMIN D3) 1.25 MG (50000 UT) TABS    Sig: Take 1 tablet by mouth once a week.    Dispense:  12 tablet    Refill:  1   cyanocobalamin (VITAMIN B12) injection 1,000 mcg    No orders of the defined types were placed in this encounter.   Patient Instructions  If interested, check with pharmacy about new 2 shot shingles series (shingrix).  Bring [place] a copy of your living will.  I do recommend you restart atorvastatin 20mg  daily - I've sent this in to the pharmacy.  Start weekly prescription vitamin D 50,000 units weekly sent to pharmacy Start monthly B12 shots through our office, first one today. Schedule monthly x 6 months then we will recheck.  Return in 6 months for diabetes follow up visit   Follow up plan: Return in about 6 months (around 07/07/2023) for follow up visit.  Ria Bush, MD

## 2023-01-04 NOTE — Assessment & Plan Note (Signed)
Stable period without heartburn, not using PPI

## 2023-01-04 NOTE — Assessment & Plan Note (Addendum)
Advanced directive - has completed. HCPOA would be daughter [name]. Full code but would not want prolonged life support if terminal condition. Asked to bring [place] a copy.

## 2023-01-04 NOTE — Assessment & Plan Note (Signed)
Stable period off medication.  

## 2023-01-04 NOTE — Addendum Note (Signed)
Addended by: Brenton Grills on: [id number] AM   Modules accepted: Orders

## 2023-01-04 NOTE — Assessment & Plan Note (Signed)
Encouraged healthy diet and lifestyle choices to affect sustainable weight loss.  Obesity complicated by comorbidities of hypertension, diabetes, dyslipidemia and CKD.

## 2023-01-04 NOTE — Assessment & Plan Note (Addendum)
Reviewed with patient, encouraged good water inatke.

## 2023-01-04 NOTE — Assessment & Plan Note (Signed)
Levels low despite daily b12 - will restart monthly injections x6 mo, reassess control at f/u visit.

## 2023-01-04 NOTE — Assessment & Plan Note (Addendum)
BP elevated today despite amlodipine 5mg  daily - but improved on recheck.

## 2023-02-08 ENCOUNTER — Ambulatory Visit (INDEPENDENT_AMBULATORY_CARE_PROVIDER_SITE_OTHER): Payer: PPO | Admitting: *Deleted

## 2023-02-08 DIAGNOSIS — E538 Deficiency of other specified B group vitamins: Secondary | ICD-10-CM | POA: Diagnosis not present

## 2023-02-08 MED ORDER — CYANOCOBALAMIN 1000 MCG/ML IJ SOLN
1000.0000 ug | Freq: Once | INTRAMUSCULAR | Status: AC
  Administered 2023-02-08: 1000 ug via INTRAMUSCULAR

## 2023-02-08 NOTE — Progress Notes (Signed)
Per orders of Dr. Gutierrez, injection of Vitamin B-12 given by Jamichael Knotts. Patient tolerated injection well. 

## 2023-03-10 ENCOUNTER — Ambulatory Visit: Payer: PPO | Admitting: Dermatology

## 2023-03-10 VITALS — BP 156/82 | HR 82

## 2023-03-10 DIAGNOSIS — L2089 Other atopic dermatitis: Secondary | ICD-10-CM

## 2023-03-10 DIAGNOSIS — L820 Inflamed seborrheic keratosis: Secondary | ICD-10-CM | POA: Diagnosis not present

## 2023-03-10 DIAGNOSIS — D1801 Hemangioma of skin and subcutaneous tissue: Secondary | ICD-10-CM | POA: Diagnosis not present

## 2023-03-10 DIAGNOSIS — L821 Other seborrheic keratosis: Secondary | ICD-10-CM

## 2023-03-10 DIAGNOSIS — Z79899 Other long term (current) drug therapy: Secondary | ICD-10-CM

## 2023-03-10 DIAGNOSIS — L720 Epidermal cyst: Secondary | ICD-10-CM | POA: Diagnosis not present

## 2023-03-10 DIAGNOSIS — W908XXA Exposure to other nonionizing radiation, initial encounter: Secondary | ICD-10-CM

## 2023-03-10 DIAGNOSIS — Z1283 Encounter for screening for malignant neoplasm of skin: Secondary | ICD-10-CM

## 2023-03-10 DIAGNOSIS — L578 Other skin changes due to chronic exposure to nonionizing radiation: Secondary | ICD-10-CM

## 2023-03-10 DIAGNOSIS — L729 Follicular cyst of the skin and subcutaneous tissue, unspecified: Secondary | ICD-10-CM

## 2023-03-10 DIAGNOSIS — Z7189 Other specified counseling: Secondary | ICD-10-CM

## 2023-03-10 DIAGNOSIS — D229 Melanocytic nevi, unspecified: Secondary | ICD-10-CM

## 2023-03-10 DIAGNOSIS — X32XXXA Exposure to sunlight, initial encounter: Secondary | ICD-10-CM

## 2023-03-10 DIAGNOSIS — L309 Dermatitis, unspecified: Secondary | ICD-10-CM | POA: Diagnosis not present

## 2023-03-10 DIAGNOSIS — L814 Other melanin hyperpigmentation: Secondary | ICD-10-CM

## 2023-03-10 DIAGNOSIS — Z85828 Personal history of other malignant neoplasm of skin: Secondary | ICD-10-CM

## 2023-03-10 MED ORDER — EUCRISA 2 % EX OINT: TOPICAL_OINTMENT | CUTANEOUS | 1 refills | Status: AC

## 2023-03-10 NOTE — Progress Notes (Signed)
Follow-Up Visit   Subjective  Kevin Campbell is a 77 y.o. male who presents for the following: Skin Cancer Screening and Full Body Skin Exam  The patient presents for Total-Body Skin Exam (TBSE) for skin cancer screening and mole check. The patient has spots, moles and lesions to be evaluated, some may be new or changing and the patient has concerns that these could be cancer.    The following portions of the chart were reviewed this encounter and updated as appropriate: medications, allergies, medical history  Review of Systems:  No other skin or systemic complaints except as noted in HPI or Assessment and Plan.  Objective  Well appearing patient in no apparent distress; mood and affect are within normal limits.  A full examination was performed including scalp, head, eyes, ears, nose, lips, neck, chest, axillae, abdomen, back, buttocks, bilateral upper extremities, bilateral lower extremities, hands, feet, fingers, toes, fingernails, and toenails. All findings within normal limits unless otherwise noted below.   Relevant physical exam findings are noted in the Assessment and Plan.  R back x 2, hands x 17, L face x 6 (25) Erythematous stuck-on, waxy papule or plaque    Assessment & Plan   LENTIGINES, SEBORRHEIC KERATOSES, HEMANGIOMAS - Benign normal skin lesions - Benign-appearing - Call for any changes  MELANOCYTIC NEVI - Tan-brown and/or pink-flesh-colored symmetric macules and papules - Benign appearing on exam today - Observation - Call clinic for new or changing moles - Recommend daily use of broad spectrum spf 30+ sunscreen to sun-exposed areas.   ACTINIC DAMAGE - Chronic condition, secondary to cumulative UV/sun exposure - diffuse scaly erythematous macules with underlying dyspigmentation - Recommend daily broad spectrum sunscreen SPF 30+ to sun-exposed areas, reapply every 2 hours as needed.  - Staying in the shade or wearing long sleeves, sun glasses (UVA+UVB  protection) and wide brim hats (4-inch brim around the entire circumference of the hat) are also recommended for sun protection.  - Call for new or changing lesions.  HISTORY OF BASAL CELL CARCINOMA OF THE SKIN - No evidence of recurrence today - Recommend regular full body skin exams - Recommend daily broad spectrum sunscreen SPF 30+ to sun-exposed areas, reapply every 2 hours as needed.  - Call if any new or changing lesions are noted between office visits  EPIDERMAL INCLUSION CYST Exam: Subcutaneous nodule at R scapula 0.8 cm   Benign-appearing. Exam most consistent with an epidermal inclusion cyst. Discussed that a cyst is a benign growth that can grow over time and sometimes get irritated or inflamed. Recommend observation if it is not bothersome. Discussed option of surgical excision to remove it if it is growing, symptomatic, or other changes noted. Please call for new or changing lesions so they can be evaluated.  Inflamed seborrheic keratosis (25) R back x 2, hands x 17, L face x 6  Destruction of lesion - R back x 2, hands x 17, L face x 6 Complexity: simple   Destruction method: cryotherapy   Informed consent: discussed and consent obtained   Timeout:  patient name, date of birth, surgical site, and procedure verified Lesion destroyed using liquid nitrogen: Yes   Region frozen until ice ball extended beyond lesion: Yes   Outcome: patient tolerated procedure well with no complications   Post-procedure details: wound care instructions given     HAND DERMATITIS Exam Scaly pink plaques +/- fissures  Chronic and persistent condition with duration or expected duration over one year. Condition is bothersome/symptomatic for  patient. Currently flared.  Treatment Plan Start Eucrisa 2% ointment to aa's hands QD.  Hand Dermatitis is a chronic type of eczema that can come and go on the hands and fingers.  While there is no cure, the rash and symptoms can be managed with topical  prescription medications, and for more severe cases, with systemic medications.  Recommend mild soap and routine use of moisturizing cream after handwashing.  Minimize soap/water exposure when possible.    SKIN CANCER SCREENING PERFORMED TODAY.  Return in about 1 year (around 03/09/2024) for TBSE - hx BCC.  Maylene Roes, CMA, am acting as scribe for Armida Sans, MD .  Documentation: I have reviewed the above documentation for accuracy and completeness, and I agree with the above.  Armida Sans, MD

## 2023-03-10 NOTE — Patient Instructions (Signed)
Due to recent changes in healthcare laws, you may see results of your pathology and/or laboratory studies on MyChart before the doctors have had a chance to review them. We understand that in some cases there may be results that are confusing or concerning to you. Please understand that not all results are received at the same time and often the doctors may need to interpret multiple results in order to provide you with the best plan of care or course of treatment. Therefore, we ask that you please give us 2 business days to thoroughly review all your results before contacting the office for clarification. Should we see a critical lab result, you will be contacted sooner.   If You Need Anything After Your Visit  If you have any questions or concerns for your doctor, please call our main line at [phone] and press option 4 to reach your doctor's medical assistant. If no one answers, please leave a voicemail as directed and we will return your call as soon as possible. Messages left after 4 pm will be answered the following business day.   You may also send us a message via MyChart. We typically respond to MyChart messages within 1-2 business days.  For prescription refills, please ask your pharmacy to contact our office. Our fax number is [phone].  If you have an urgent issue when the clinic is closed that cannot wait until the next business day, you can page your doctor at the number below.    Please note that while we do our best to be available for urgent issues outside of office hours, we are not available 24/7.   If you have an urgent issue and are unable to reach us, you may choose to seek medical care at your doctor's office, retail clinic, urgent care center, or emergency room.  If you have a medical emergency, please immediately call 911 or go to the emergency department.  Pager Numbers  - Dr. Kowalski: [phone]  - Dr. Moye: [phone]  - Dr. Stewart:  [phone]  In the event of inclement weather, please call our main line at [phone] for an update on the status of any delays or closures.  Dermatology Medication Tips: Please keep the boxes that topical medications come in in order to help keep track of the instructions about where and how to use these. Pharmacies typically print the medication instructions only on the boxes and not directly on the medication tubes.   If your medication is too expensive, please contact our office at [phone] option 4 or send us a message through MyChart.   We are unable to tell what your co-pay for medications will be in advance as this is different depending on your insurance coverage. However, we may be able to find a substitute medication at lower cost or fill out paperwork to get insurance to cover a needed medication.   If a prior authorization is required to get your medication covered by your insurance company, please allow us 1-2 business days to complete this process.  Drug prices often vary depending on where the prescription is filled and some pharmacies may offer cheaper prices.  The website www.goodrx.com contains coupons for medications through different pharmacies. The prices here do not account for what the cost may be with help from insurance (it may be cheaper with your insurance), but the website can give you the price if you did not use any insurance.  - You can print the associated coupon and take it with   your prescription to the pharmacy.  - You may also stop by our office during regular business hours and pick up a GoodRx coupon card.  - If you need your prescription sent electronically to a different pharmacy, notify our office through [facility] MyChart or by phone at [phone] option 4.     Si Usted Necesita Algo Despus de Su Visita  Tambin puede enviarnos un mensaje a travs de MyChart. Por lo general respondemos a los mensajes de MyChart en el transcurso de 1 a 2  das hbiles.  Para renovar recetas, por favor pida a su farmacia que se ponga en contacto con nuestra oficina. Nuestro nmero de fax es el [phone].  Si tiene un asunto urgente cuando la clnica est cerrada y que no puede esperar hasta el siguiente da hbil, puede llamar/localizar a su doctor(a) al nmero que aparece a continuacin.   Por favor, tenga en cuenta que aunque hacemos todo lo posible para estar disponibles para asuntos urgentes fuera del horario de oficina, no estamos disponibles las 24 horas del da, los 7 das de la semana.   Si tiene un problema urgente y no puede comunicarse con nosotros, puede optar por buscar atencin mdica  en el consultorio de su doctor(a), en una clnica privada, en un centro de atencin urgente o en una sala de emergencias.  Si tiene una emergencia mdica, por favor llame inmediatamente al 911 o vaya a la sala de emergencias.  Nmeros de bper  - Dr. Kowalski: [phone]  - Dra. Moye: [phone]  - Dra. Stewart: [phone]  En caso de inclemencias del tiempo, por favor llame a nuestra lnea principal al [phone] para una actualizacin sobre el estado de cualquier retraso o cierre.  Consejos para la medicacin en dermatologa: Por favor, guarde las cajas en las que vienen los medicamentos de uso tpico para ayudarle a seguir las instrucciones sobre dnde y cmo usarlos. Las farmacias generalmente imprimen las instrucciones del medicamento slo en las cajas y no directamente en los tubos del medicamento.   Si su medicamento es muy caro, por favor, pngase en contacto con nuestra oficina llamando al [phone] y presione la opcin 4 o envenos un mensaje a travs de MyChart.   No podemos decirle cul ser su copago por los medicamentos por adelantado ya que esto es diferente dependiendo de la cobertura de su seguro. Sin embargo, es posible que podamos encontrar un medicamento sustituto a menor costo o llenar un formulario para que el  seguro cubra el medicamento que se considera necesario.   Si se requiere una autorizacin previa para que su compaa de seguros cubra su medicamento, por favor permtanos de 1 a 2 das hbiles para completar este proceso.  Los precios de los medicamentos varan con frecuencia dependiendo del lugar de dnde se surte la receta y alguna farmacias pueden ofrecer precios ms baratos.  El sitio web www.goodrx.com tiene cupones para medicamentos de diferentes farmacias. Los precios aqu no tienen en cuenta lo que podra costar con la ayuda del seguro (puede ser ms barato con su seguro), pero el sitio web puede darle el precio si no utiliz ningn seguro.  - Puede imprimir el cupn correspondiente y llevarlo con su receta a la farmacia.  - Tambin puede pasar por nuestra oficina durante el horario de atencin regular y recoger una tarjeta de cupones de GoodRx.  - Si necesita que su receta se enve electrnicamente a una farmacia diferente, informe a nuestra oficina a travs de MyChart de [facility]   o por telfono llamando al [phone] y presione la opcin 4.  

## 2023-03-15 ENCOUNTER — Ambulatory Visit (INDEPENDENT_AMBULATORY_CARE_PROVIDER_SITE_OTHER): Payer: PPO

## 2023-03-15 DIAGNOSIS — E538 Deficiency of other specified B group vitamins: Secondary | ICD-10-CM | POA: Diagnosis not present

## 2023-03-15 MED ORDER — CYANOCOBALAMIN 1000 MCG/ML IJ SOLN
1000.0000 ug | Freq: Once | INTRAMUSCULAR | Status: AC
  Administered 2023-03-15: 1000 ug via INTRAMUSCULAR

## 2023-03-15 NOTE — Progress Notes (Signed)
Per orders of Dr. Eustaquio Boyden, injection of Vitamin B 12 given by Lewanda Rife in left deltoid. Patient tolerated injection well. Patient will make appointment for 1 month.

## 2023-03-22 ENCOUNTER — Encounter: Payer: Self-pay | Admitting: Dermatology

## 2023-04-19 ENCOUNTER — Ambulatory Visit (INDEPENDENT_AMBULATORY_CARE_PROVIDER_SITE_OTHER): Payer: PPO

## 2023-04-19 DIAGNOSIS — E538 Deficiency of other specified B group vitamins: Secondary | ICD-10-CM

## 2023-04-19 MED ORDER — CYANOCOBALAMIN 1000 MCG/ML IJ SOLN
1000.0000 ug | Freq: Once | INTRAMUSCULAR | Status: AC
  Administered 2023-04-19: 1000 ug via INTRAMUSCULAR

## 2023-04-19 NOTE — Progress Notes (Signed)
Per orders of Dr. Eustaquio Boyden, injection of B-12 4th of 6 needed given by Donnamarie Poag in right deltoid. Patient tolerated injection well. Patient will make appointment for 1 month.

## 2023-05-24 ENCOUNTER — Ambulatory Visit (INDEPENDENT_AMBULATORY_CARE_PROVIDER_SITE_OTHER): Payer: PPO

## 2023-05-24 DIAGNOSIS — E538 Deficiency of other specified B group vitamins: Secondary | ICD-10-CM | POA: Diagnosis not present

## 2023-05-24 MED ORDER — CYANOCOBALAMIN 1000 MCG/ML IJ SOLN
1000.0000 ug | Freq: Once | INTRAMUSCULAR | Status: AC
  Administered 2023-05-24: 1000 ug via INTRAMUSCULAR

## 2023-05-24 NOTE — Progress Notes (Signed)
Per orders of Dr. Eustaquio Boyden, injection of B-12 given by Donnamarie Poag in left deltoid. Patient tolerated injection well. Patient will make appointment for 1 month for last ordered injection.

## 2023-05-27 ENCOUNTER — Ambulatory Visit (INDEPENDENT_AMBULATORY_CARE_PROVIDER_SITE_OTHER)
Admission: RE | Admit: 2023-05-27 | Discharge: 2023-05-27 | Disposition: A | Discharge status: Home / Self Care | Payer: PPO | Source: Ambulatory Visit | Attending: Family Medicine | Admitting: Family Medicine

## 2023-05-27 ENCOUNTER — Encounter: Payer: Self-pay | Admitting: Family Medicine

## 2023-05-27 ENCOUNTER — Ambulatory Visit (INDEPENDENT_AMBULATORY_CARE_PROVIDER_SITE_OTHER): Payer: PPO | Admitting: Family Medicine

## 2023-05-27 VITALS — BP 138/68 | HR 58 | Temp 97.5°F | Ht 66.5 in | Wt 235.1 lb

## 2023-05-27 DIAGNOSIS — Z1211 Encounter for screening for malignant neoplasm of colon: Secondary | ICD-10-CM | POA: Diagnosis not present

## 2023-05-27 DIAGNOSIS — R0781 Pleurodynia: Secondary | ICD-10-CM

## 2023-05-27 DIAGNOSIS — E559 Vitamin D deficiency, unspecified: Secondary | ICD-10-CM | POA: Diagnosis not present

## 2023-05-27 DIAGNOSIS — R0789 Other chest pain: Secondary | ICD-10-CM | POA: Insufficient documentation

## 2023-05-27 DIAGNOSIS — R21 Rash and other nonspecific skin eruption: Secondary | ICD-10-CM

## 2023-05-27 DIAGNOSIS — E538 Deficiency of other specified B group vitamins: Secondary | ICD-10-CM | POA: Diagnosis not present

## 2023-05-27 MED ORDER — TRIAMCINOLONE ACETONIDE 0.1 % EX CREA: 1.0000 | TOPICAL_CREAM | Freq: Two times a day (BID) | CUTANEOUS | 1 refills | Status: DC

## 2023-05-27 NOTE — Progress Notes (Signed)
Ph: [phone] Fax: [phone]   Patient ID: Kevin Campbell, male    DOB: 1946/02/09, 77 y.o.   MRN: [id number]  This visit was conducted in person.  BP 138/68 (BP Location: Right Arm, Cuff Size: Large)   Pulse (!) 58   Temp (!) 97.5 F (36.4 C) (Temporal)   Ht 5' 6.5" (1.689 m)   Wt 235 lb 2 oz (106.7 kg)   SpO2 96%   BMI 37.38 kg/m   BP Readings from Last 3 Encounters:  05/27/23 138/68  03/10/23 (!) 156/82  01/04/23 130/78  Elevated BP attributed to chest wall discomfort - improved on repeat  CC: fall with injury, itchy rash  Subjective:   HPI: Kevin Campbell is a 77 y.o. male presenting on 05/27/2023 for Fall (Pt fell on sidewalk at home about 2 wks ago. C/o R side rib pain, especially when coughing. ) and Rash (C/o itchy rash on R chest. Noticed about 1 mo ago. H/o same rash previously on L chest. )   DOI: 2 wks ago  Larey Seat outside tripping on sidewalk, landed on R arm into side of anterior chest.  Pain persists - not improving. Worse pain with coughing. Has not slowed down - continues active lifestyle ie mowing.  Treating with aleve and ibuprofen for pain with limited relief as well as heating pad.  No dyspnea, fever, cough.   Also - 1 mo h/o itchy rash to right chest wall.  No new lotions, detergents, soaps or shampoos. No new medicines, supplements.  No new beds. Lots of mosquitoes recently.  Has tried cortizone-10 OTC as well as calamine lotion or other anti-itch cream.  H/o similar rash on left remotely - doesn't remember how it got better.  H/o seronegative RA off DMARD.  Vit B12 and D deficiencies - receiving monthly b12 shots as well as weekly vit D replacement.      Relevant past medical, surgical, family and social history reviewed and updated as indicated. Interim medical history since our last visit reviewed. Allergies and medications reviewed and updated. Outpatient Medications Prior to Visit  Medication Sig Dispense Refill   amLODipine  (NORVASC) 5 MG tablet Take 1 tablet (5 mg total) by mouth daily. 90 tablet 4   aspirin EC 81 MG tablet Take 81 mg by mouth daily.     atorvastatin (LIPITOR) 20 MG tablet Take 1 tablet (20 mg total) by mouth daily at 6 PM. 90 tablet 4   Blood Glucose Monitoring Suppl (ONETOUCH VERIO REFLECT) w/Device KIT Use as instructed to check blood sugar once a day 1 kit 0   Cholecalciferol (VITAMIN D3) 1.25 MG (50000 UT) TABS Take 1 tablet by mouth once a week. 12 tablet 1   Crisaborole (EUCRISA) 2 % OINT Apply to aa's hands QD. 100 g 1   glucose blood (ONETOUCH VERIO) test strip Use as instructed to check blood sugar once a day 100 each 3   Lancets (ONETOUCH DELICA PLUS LANCET33G) MISC Use as directed to check blood sugar once a day 100 each 3   mupirocin ointment (BACTROBAN) 2 % Apply 1 Application topically daily. Qd to excision site 22 g 0   omeprazole (PRILOSEC) 40 MG capsule Take 1 capsule (40 mg total) by mouth daily. Take daily for 2 weeks then as needed 30 capsule 1   No facility-administered medications prior to visit.     Per HPI unless specifically indicated in ROS section below Review of Systems  Objective:  BP 138/68 (BP  Location: Right Arm, Cuff Size: Large)   Pulse (!) 58   Temp (!) 97.5 F (36.4 C) (Temporal)   Ht 5' 6.5" (1.689 m)   Wt 235 lb 2 oz (106.7 kg)   SpO2 96%   BMI 37.38 kg/m   Wt Readings from Last 3 Encounters:  05/27/23 235 lb 2 oz (106.7 kg)  01/04/23 230 lb 2 oz (104.4 kg)  12/20/22 231 lb (104.8 kg)      Physical Exam Vitals and nursing note reviewed.  Constitutional:      Appearance: Normal appearance. He is not ill-appearing.  HENT:     Mouth/Throat:     Mouth: Mucous membranes are moist.     Pharynx: Oropharynx is clear. No oropharyngeal exudate or posterior oropharyngeal erythema.  Eyes:     Extraocular Movements: Extraocular movements intact.     Pupils: Pupils are equal, round, and reactive to light.  Cardiovascular:     Rate and Rhythm: Normal  rate and regular rhythm.     Pulses: Normal pulses.     Heart sounds: Normal heart sounds. No murmur heard. Pulmonary:     Effort: Pulmonary effort is normal. No respiratory distress.     Breath sounds: No wheezing, rhonchi or rales.     Comments: Coarse and diminished breath sounds bilaterally below mid lungs  Chest:     Chest wall: Tenderness present.       Comments: Reproducible tenderness to right anterior into lateral lower ribcage  Skin:    General: Skin is warm and dry.     Coloration: Skin is not pale.     Findings: Rash present. Rash is papular.     Comments: Faint pruritic papular rash to R anterior chest wall, few spots on left chest and into R upper arm  Neurological:     Mental Status: He is alert.  Psychiatric:        Mood and Affect: Mood normal.        Behavior: Behavior normal.       Results for orders placed or performed in visit on 12/31/22  HM DIABETES EYE EXAM  Result Value Ref Range   HM Diabetic Eye Exam No Retinopathy No Retinopathy   Lab Results  Component Value Date   HGBA1C 6.3 12/28/2022   Assessment & Plan:  Has not completed cologuard - states it never arrived. Will reorder, I asked him to let me know if not received within 2 wks.  Problem List Items Addressed This Visit     Skin rash    Pruritic papular rash to chest wall - ?insect bites however duration of 3+ wks points against this.  ?med related - will hold vit D replacement for now.  Rec triamcinolone steroid cream PRN. Update if worsening, o/w reassess at f/u visit in 2 months.       Vitamin B12 deficiency    Continues monthly b12 shots through our office, to complete 6 months by next visit then will reassess control       Vitamin D deficiency    Has been replacing with weekly 50k Rx dosing.  ?contribution to rash - will hold for next 2 months, reassess control next visit.       Rib pain on right side - Primary    Suspect rib strain after fall - check xray to eval for  fracture, lung parenchymal complication.  Supportive home measures recommended - discussed NSAID, heating pad use, add tylenol. Update if breakthrough pain despite this.  Relevant Orders   DG Ribs Unilateral W/Chest Right   Other Visit Diagnoses     Special screening for malignant neoplasms, colon       Relevant Orders   Cologuard        Meds ordered this encounter  Medications   triamcinolone cream (KENALOG) 0.1 %    Sig: Apply 1 Application topically 2 (two) times daily. Apply to AA for no more than 10 days at a time.    Dispense:  45 g    Refill:  1    Orders Placed This Encounter  Procedures   DG Ribs Unilateral W/Chest Right    Standing Status:   Future    Number of Occurrences:   1    Standing Expiration Date:   11/27/2023    Order Specific Question:   Reason for Exam (SYMPTOM  OR DIAGNOSIS REQUIRED)    Answer:   right anteriolateral lower ribcage pain after  fall 2 wks ago    Order Specific Question:   Preferred imaging location?    Answer:   [facility]   Cologuard    Previously ordered 12/2022 - pt states it never arrived. Verified address is correct    Patient Instructions  Xrays today  For itchy rash on right side of chest - may try stronger steroid cream triamcinolone sent to pharmacy.  Hold vitamin D weekly replacement for now.  Let us know if not improving with treatment for further evaluation.   Follow up plan: Return if symptoms worsen or fail to improve.  Eustaquio Boyden, MD

## 2023-05-27 NOTE — Assessment & Plan Note (Signed)
Continues monthly b12 shots through our office, to complete 6 months by next visit then will reassess control

## 2023-05-27 NOTE — Assessment & Plan Note (Signed)
Pruritic papular rash to chest wall - ?insect bites however duration of 3+ wks points against this.  ?med related - will hold vit D replacement for now.  Rec triamcinolone steroid cream PRN. Update if worsening, o/w reassess at f/u visit in 2 months.

## 2023-05-27 NOTE — Assessment & Plan Note (Signed)
Has been replacing with weekly 50k Rx dosing.  ?contribution to rash - will hold for next 2 months, reassess control next visit.

## 2023-05-27 NOTE — Assessment & Plan Note (Signed)
Suspect rib strain after fall - check xray to eval for fracture, lung parenchymal complication.  Supportive home measures recommended - discussed NSAID, heating pad use, add tylenol. Update if breakthrough pain despite this.

## 2023-05-27 NOTE — Patient Instructions (Addendum)
Xrays today  For itchy rash on right side of chest - may try stronger steroid cream triamcinolone sent to pharmacy.  Hold vitamin D weekly replacement for now.  Let us know if not improving with treatment for further evaluation.

## 2023-06-09 ENCOUNTER — Telehealth: Payer: Self-pay

## 2023-06-09 NOTE — Patient Outreach (Signed)
  Care Coordination   06/09/2023 Name: Kevin Campbell MRN: [id number] DOB: 12-05-1945   Care Coordination Outreach Attempts:  Successful contact made with patient.  Patient states he is currently driving and request call back at another time.   Follow Up Plan:  Additional outreach attempts will be made to offer the patient care coordination information and services.   Encounter Outcome:  Pt. Request to Call Back   Care Coordination Interventions:  No, not indicated    George Ina [facility] Care Coordination [phone] direct line

## 2023-06-27 DIAGNOSIS — Z1211 Encounter for screening for malignant neoplasm of colon: Secondary | ICD-10-CM | POA: Diagnosis not present

## 2023-06-28 ENCOUNTER — Ambulatory Visit: Payer: PPO

## 2023-06-30 LAB — COLOGUARD: COLOGUARD: NEGATIVE

## 2023-08-02 ENCOUNTER — Ambulatory Visit: Payer: PPO

## 2023-08-02 ENCOUNTER — Ambulatory Visit: Payer: PPO | Admitting: Family Medicine

## 2023-08-02 DIAGNOSIS — E119 Type 2 diabetes mellitus without complications: Secondary | ICD-10-CM

## 2023-08-02 DIAGNOSIS — E538 Deficiency of other specified B group vitamins: Secondary | ICD-10-CM

## 2023-08-03 ENCOUNTER — Encounter: Payer: Self-pay | Admitting: Family Medicine

## 2023-08-03 ENCOUNTER — Telehealth: Payer: Self-pay | Admitting: Family Medicine

## 2023-08-03 NOTE — Telephone Encounter (Signed)
Pt missed DM f/u visit yesterday. Would offer to reschedule this and come fasting for labwork to appt.

## 2023-08-03 NOTE — Telephone Encounter (Signed)
Spoke to pt, r/s ov for 08/16/23

## 2023-08-16 ENCOUNTER — Ambulatory Visit (INDEPENDENT_AMBULATORY_CARE_PROVIDER_SITE_OTHER): Payer: PPO | Admitting: Family Medicine

## 2023-08-16 ENCOUNTER — Encounter: Payer: Self-pay | Admitting: Family Medicine

## 2023-08-16 VITALS — BP 154/90 | HR 67 | Temp 97.8°F | Ht 66.5 in | Wt 230.1 lb

## 2023-08-16 DIAGNOSIS — E119 Type 2 diabetes mellitus without complications: Secondary | ICD-10-CM

## 2023-08-16 DIAGNOSIS — I10 Essential (primary) hypertension: Secondary | ICD-10-CM | POA: Diagnosis not present

## 2023-08-16 DIAGNOSIS — E559 Vitamin D deficiency, unspecified: Secondary | ICD-10-CM

## 2023-08-16 DIAGNOSIS — R21 Rash and other nonspecific skin eruption: Secondary | ICD-10-CM | POA: Diagnosis not present

## 2023-08-16 DIAGNOSIS — N1831 Chronic kidney disease, stage 3a: Secondary | ICD-10-CM | POA: Diagnosis not present

## 2023-08-16 DIAGNOSIS — R7303 Prediabetes: Secondary | ICD-10-CM

## 2023-08-16 DIAGNOSIS — E538 Deficiency of other specified B group vitamins: Secondary | ICD-10-CM

## 2023-08-16 LAB — POCT GLYCOSYLATED HEMOGLOBIN (HGB A1C): Hemoglobin A1C: 6 % — AB (ref 4.0–5.6)

## 2023-08-16 MED ORDER — VITAMIN B-12 1000 MCG PO TABS: 1000.0000 ug | ORAL_TABLET | Freq: Every day | ORAL | Status: DC

## 2023-08-16 MED ORDER — CYANOCOBALAMIN 1000 MCG/ML IJ SOLN
1000.0000 ug | Freq: Once | INTRAMUSCULAR | Status: AC
Start: 2023-08-16 — End: 2023-08-16
  Administered 2023-08-16: 1000 ug via INTRAMUSCULAR

## 2023-08-16 NOTE — Patient Instructions (Addendum)
Flu shot today B12 shot today Schedule lab visit only in 1 month to recheck vitamin levels and kidney function Restart blood pressure medicine amlodipine 5mg  daily and cholesterol medicine atorvastatin.  Good to see you today Return in 6 months for physical/wellness visit

## 2023-08-16 NOTE — Assessment & Plan Note (Signed)
Check at 1 mo lab visit.

## 2023-08-16 NOTE — Assessment & Plan Note (Addendum)
Chronic, deteriorated. BP elevated today however he ran out of amlodipine and didn't refill. He does have refills available at pharmacy - he will go pick up and restart taking.

## 2023-08-16 NOTE — Assessment & Plan Note (Signed)
Chronic, stable period with A1c 6%

## 2023-08-16 NOTE — Assessment & Plan Note (Addendum)
Received b12 shot today.  Will return in 1 month for lab visit to recheck vitamin b12 levels  Will continue oral replacement in interim.

## 2023-08-16 NOTE — Assessment & Plan Note (Signed)
Restart vit D 50k u weekly, update levels at next month labs

## 2023-08-16 NOTE — Progress Notes (Signed)
Ph: [phone] Fax: [phone]   Patient ID: Kevin Campbell, male    DOB: 1946/08/08, 77 y.o.   MRN: [id number]  This visit was conducted in person.  BP (!) 154/90 (BP Location: Right Arm, Cuff Size: Large)   Pulse 67   Temp 97.8 F (36.6 C) (Oral)   Ht 5' 6.5" (1.689 m)   Wt 230 lb 2 oz (104.4 kg)   SpO2 98%   BMI 36.59 kg/m    CC: 6 mo DM f/u visit  Subjective:   HPI: Kevin Campbell is a 77 y.o. male presenting on 08/16/2023 for Medical Management of Chronic Issues (Here for 6 mo DM f/u. Pt states he's been out of amlodipine about 1 wk and BP has been elevated since- did not contact pharmacy for refill. )   H/o seronegative RA off DMARD.  Vit B12 and D deficiencies - receiving monthly b12 shots as well as weekly vit D replacement. Pruritic papular rash to chest wall - ?vit d related, this was held and rash resolved. Last B12 shot was 05/24/2023, again today. He's been taking OTC B12 tablets daily.   R sided rib discomfort has improved.   HTN - ran out of amlodipine 2-3 wks ago so he stopped taking but he had refills at pharmacy. Does check blood pressures at home daily: 170/90s. No low blood pressure readings or symptoms of syncope. Denies HA, vision changes, CP/tightness, SOB, leg swelling. Notes occ dizziness with exertion.   DM - does not regularly check sugars. Compliant with antihyperglycemic regimen which includes: diet controlled. Peak A1c 6.7% (2023). Denies low sugars or hypoglycemic symptoms. Denies paresthesias, blurry vision. Last diabetic eye exam 12/2022. Glucometer brand: one-touch verio. Last foot exam: 03/2022 - DUE. DSME: declined. Lab Results  Component Value Date   HGBA1C 6.0 (A) 08/16/2023   Diabetic Foot Exam - Simple   Simple Foot Form Diabetic Foot exam was performed with the following findings: Yes 08/16/2023 11:43 AM  Visual Inspection No deformities, no ulcerations, no other skin breakdown bilaterally: Yes Sensation Testing Intact  to touch and monofilament testing bilaterally: Yes Pulse Check See comments: Yes Comments No claudication Diminished pedal pulses on left, 2+ DP on right    Lab Results  Component Value Date   MICROALBUR 0.7 12/28/2022       Relevant past medical, surgical, family and social history reviewed and updated as indicated. Interim medical history since our last visit reviewed. Allergies and medications reviewed and updated. Outpatient Medications Prior to Visit  Medication Sig Dispense Refill   amLODipine (NORVASC) 5 MG tablet Take 1 tablet (5 mg total) by mouth daily. 90 tablet 4   aspirin EC 81 MG tablet Take 81 mg by mouth daily.     atorvastatin (LIPITOR) 20 MG tablet Take 1 tablet (20 mg total) by mouth daily at 6 PM. 90 tablet 4   Blood Glucose Monitoring Suppl (ONETOUCH VERIO REFLECT) w/Device KIT Use as instructed to check blood sugar once a day 1 kit 0   Cholecalciferol (VITAMIN D3) 1.25 MG (50000 UT) TABS Take 1 tablet by mouth once a week. 12 tablet 1   Crisaborole (EUCRISA) 2 % OINT Apply to aa's hands QD. 100 g 1   glucose blood (ONETOUCH VERIO) test strip Use as instructed to check blood sugar once a day 100 each 3   Lancets (ONETOUCH DELICA PLUS LANCET33G) MISC Use as directed to check blood sugar once a day 100 each 3   mupirocin ointment (  BACTROBAN) 2 % Apply 1 Application topically daily. Qd to excision site 22 g 0   omeprazole (PRILOSEC) 40 MG capsule Take 1 capsule (40 mg total) by mouth daily. Take daily for 2 weeks then as needed 30 capsule 1   triamcinolone cream (KENALOG) 0.1 % Apply 1 Application topically 2 (two) times daily. Apply to AA for no more than 10 days at a time. 45 g 1   No facility-administered medications prior to visit.     Per HPI unless specifically indicated in ROS section below Review of Systems  Objective:  BP (!) 154/90 (BP Location: Right Arm, Cuff Size: Large)   Pulse 67   Temp 97.8 F (36.6 C) (Oral)   Ht 5' 6.5" (1.689 m)   Wt 230  lb 2 oz (104.4 kg)   SpO2 98%   BMI 36.59 kg/m   Wt Readings from Last 3 Encounters:  08/16/23 230 lb 2 oz (104.4 kg)  05/27/23 235 lb 2 oz (106.7 kg)  01/04/23 230 lb 2 oz (104.4 kg)      Physical Exam Vitals and nursing note reviewed.  Constitutional:      Appearance: Normal appearance. He is not ill-appearing.  Eyes:     Extraocular Movements: Extraocular movements intact.     Conjunctiva/sclera: Conjunctivae normal.     Pupils: Pupils are equal, round, and reactive to light.  Cardiovascular:     Rate and Rhythm: Normal rate and regular rhythm.     Pulses: Normal pulses.     Heart sounds: Normal heart sounds. No murmur heard. Pulmonary:     Effort: Pulmonary effort is normal. No respiratory distress.     Breath sounds: Normal breath sounds. No wheezing, rhonchi or rales.  Musculoskeletal:     Right lower leg: No edema.     Left lower leg: No edema.     Comments: See HPI for foot exam if done  Skin:    General: Skin is warm and dry.     Findings: No rash.  Neurological:     Mental Status: He is alert.  Psychiatric:        Mood and Affect: Mood normal.        Behavior: Behavior normal.       Results for orders placed or performed in visit on 08/16/23  POCT glycosylated hemoglobin (Hb A1C)  Result Value Ref Range   Hemoglobin A1C 6.0 (A) 4.0 - 5.6 %   HbA1c POC (<> result, manual entry)     HbA1c, POC (prediabetic range)     HbA1c, POC (controlled diabetic range)      Assessment & Plan:   Problem List Items Addressed This Visit     Essential hypertension    Chronic, deteriorated. BP elevated today however he ran out of amlodipine and didn't refill. He does have refills available at pharmacy - he will go pick up and restart taking.       CKD (chronic kidney disease) stage 3, GFR 30-59 ml/min (HCC)    Check at 1 mo lab visit.       Relevant Orders   Renal function panel   Prediabetes - Primary    Chronic, stable period with A1c 6%      Relevant Orders    POCT glycosylated hemoglobin (Hb A1C) (Completed)   Skin rash    Improved off Rx vit D weekly replacement. Will monitor for recurrence if this is restarted      Vitamin B12 deficiency    Received  b12 shot today.  Will return in 1 month for lab visit to recheck vitamin b12 levels  Will continue oral replacement in interim.       Relevant Orders   Vitamin B12   Vitamin D deficiency    Restart vit D 50k u weekly, update levels at next month labs      Relevant Orders   VITAMIN D 25 Hydroxy (Vit-D Deficiency, Fractures)     Meds ordered this encounter  Medications   cyanocobalamin (VITAMIN B12) injection 1,000 mcg   cyanocobalamin (VITAMIN B12) 1000 MCG tablet    Sig: Take 1 tablet (1,000 mcg total) by mouth daily.    Orders Placed This Encounter  Procedures   Vitamin B12    Standing Status:   Future    Standing Expiration Date:   08/15/2024   VITAMIN D 25 Hydroxy (Vit-D Deficiency, Fractures)    Standing Status:   Future    Standing Expiration Date:   08/15/2024   Renal function panel    Standing Status:   Future    Standing Expiration Date:   08/15/2024   POCT glycosylated hemoglobin (Hb A1C)    Patient Instructions  Flu shot today B12 shot today Schedule lab visit only in 1 month to recheck vitamin levels and kidney function Restart blood pressure medicine amlodipine 5mg  daily and cholesterol medicine atorvastatin.  Good to see you today Return in 6 months for physical/wellness visit  Follow up plan: Return in about 6 months (around 02/13/2024) for annual exam, prior fasting for blood work, medicare wellness visit.  Eustaquio Boyden, MD

## 2023-08-16 NOTE — Assessment & Plan Note (Addendum)
Improved off Rx vit D weekly replacement. Will monitor for recurrence if this is restarted

## 2023-09-15 ENCOUNTER — Other Ambulatory Visit (INDEPENDENT_AMBULATORY_CARE_PROVIDER_SITE_OTHER): Payer: PPO

## 2023-09-15 DIAGNOSIS — E559 Vitamin D deficiency, unspecified: Secondary | ICD-10-CM | POA: Diagnosis not present

## 2023-09-15 DIAGNOSIS — N1831 Chronic kidney disease, stage 3a: Secondary | ICD-10-CM

## 2023-09-15 DIAGNOSIS — E538 Deficiency of other specified B group vitamins: Secondary | ICD-10-CM | POA: Diagnosis not present

## 2023-09-15 LAB — RENAL FUNCTION PANEL
Albumin: 4 g/dL (ref 3.5–5.2)
BUN: 22 mg/dL (ref 6–23)
CO2: 28 meq/L (ref 19–32)
Calcium: 9.2 mg/dL (ref 8.4–10.5)
Chloride: 106 meq/L (ref 96–112)
Creatinine, Ser: 1.37 mg/dL (ref 0.40–1.50)
GFR: 50.63 mL/min — ABNORMAL LOW (ref >60.00)
Glucose, Bld: 112 mg/dL — ABNORMAL HIGH (ref 70–99)
Phosphorus: 3.6 mg/dL (ref 2.3–4.6)
Potassium: 4.3 meq/L (ref 3.5–5.1)
Sodium: 141 meq/L (ref 135–145)

## 2023-09-15 LAB — VITAMIN B12: Vitamin B-12: 292 pg/mL (ref 211–911)

## 2023-09-15 LAB — VITAMIN D 25 HYDROXY (VIT D DEFICIENCY, FRACTURES): VITD: 33.14 ng/mL (ref 30.00–100.00)

## 2023-09-16 ENCOUNTER — Other Ambulatory Visit: Payer: Self-pay | Admitting: Family Medicine

## 2023-09-16 MED ORDER — VITAMIN D3 1.25 MG (50000 UT) PO TABS: 1.0000 | ORAL_TABLET | ORAL | 1 refills | Status: DC

## 2023-09-22 ENCOUNTER — Ambulatory Visit: Payer: PPO

## 2023-09-22 DIAGNOSIS — E538 Deficiency of other specified B group vitamins: Secondary | ICD-10-CM

## 2023-09-22 MED ORDER — CYANOCOBALAMIN 1000 MCG/ML IJ SOLN
1000.0000 ug | Freq: Once | INTRAMUSCULAR | Status: AC
  Administered 2023-09-22: 1000 ug via INTRAMUSCULAR

## 2023-09-22 NOTE — Progress Notes (Signed)
Per orders of Dr. Crawford Givens, injection of b12 given by Lonia Blood in right deltoid. Patient tolerated injection well. Patient will make appointment for 1 month.

## 2023-10-25 ENCOUNTER — Ambulatory Visit (INDEPENDENT_AMBULATORY_CARE_PROVIDER_SITE_OTHER): Payer: PPO

## 2023-10-25 DIAGNOSIS — E538 Deficiency of other specified B group vitamins: Secondary | ICD-10-CM

## 2023-10-25 MED ORDER — CYANOCOBALAMIN 1000 MCG/ML IJ SOLN
1000.0000 ug | Freq: Once | INTRAMUSCULAR | Status: AC
  Administered 2023-10-25: 1000 ug via INTRAMUSCULAR

## 2023-10-25 NOTE — Progress Notes (Signed)
Per orders of Dr. Javier Gutierrez, injection of vitamin b 12 given by Pricila Bridge in left deltoid. Patient tolerated injection well. Patient will make appointment for 1 month.   

## 2023-11-29 ENCOUNTER — Ambulatory Visit (INDEPENDENT_AMBULATORY_CARE_PROVIDER_SITE_OTHER): Payer: PPO

## 2023-11-29 DIAGNOSIS — E538 Deficiency of other specified B group vitamins: Secondary | ICD-10-CM | POA: Diagnosis not present

## 2023-11-29 MED ORDER — CYANOCOBALAMIN 1000 MCG/ML IJ SOLN
1000.0000 ug | Freq: Once | INTRAMUSCULAR | Status: AC
  Administered 2023-11-29: 1000 ug via INTRAMUSCULAR

## 2023-11-29 NOTE — Progress Notes (Signed)
Per orders of Dr. Javier Gutierrez, injection of vitamin b 12 given by Rena Isley in right deltoid. Patient tolerated injection well. Patient will make appointment for 1 month.   

## 2023-12-29 ENCOUNTER — Ambulatory Visit: Payer: PPO

## 2023-12-29 DIAGNOSIS — E538 Deficiency of other specified B group vitamins: Secondary | ICD-10-CM | POA: Diagnosis not present

## 2023-12-29 MED ORDER — CYANOCOBALAMIN 1000 MCG/ML IJ SOLN
1000.0000 ug | Freq: Once | INTRAMUSCULAR | Status: AC
  Administered 2023-12-29: 1000 ug via INTRAMUSCULAR

## 2023-12-29 NOTE — Progress Notes (Signed)
 Per orders ofDr Eustaquio Boyden who is out of office and Dr. Loleta Books is in office injection of vitamin b 12 given by Lewanda Rife in left deltoid. Patient tolerated injection well. Patient will make appointment for 1 month.

## 2024-01-13 ENCOUNTER — Ambulatory Visit: Payer: PPO

## 2024-01-13 VITALS — BP 128/82 | Ht 66.5 in | Wt 233.6 lb

## 2024-01-13 DIAGNOSIS — Z0000 Encounter for general adult medical examination without abnormal findings: Secondary | ICD-10-CM | POA: Diagnosis not present

## 2024-01-13 NOTE — Patient Instructions (Signed)
 Kevin Campbell , Thank you for taking time to come for your Medicare Wellness Visit. I appreciate your ongoing commitment to your health goals. Please review the following plan we discussed and let me know if I can assist you in the future.   Referrals/Orders/Follow-Ups/Clinician Recommendations: none  This is a list of the screening recommended for you and due dates:  Health Maintenance  Topic Date Due   COVID-19 Vaccine (1) Never done   Zoster (Shingles) Vaccine (1 of 2) Never done   DTaP/Tdap/Td vaccine (2 - Tdap) 10/17/2016   Yearly kidney health urinalysis for diabetes  12/28/2023   Eye exam for diabetics  12/30/2023   Hemoglobin A1C  02/13/2024   Flu Shot  05/11/2024   Complete foot exam   08/15/2024   Yearly kidney function blood test for diabetes  09/14/2024   Medicare Annual Wellness Visit  01/12/2025   Cologuard (Stool DNA test)  06/26/2026   Pneumonia Vaccine  Completed   Hepatitis C Screening  Completed   HPV Vaccine  Aged Out   Colon Cancer Screening  Discontinued    Advanced directives: (Copy Requested) Please bring a copy of your health care power of attorney and living will to the office to be added to your chart at your convenience. You can mail to [facility] [address] [place], [place] [address] or email to [email or web address]  Next Medicare Annual Wellness Visit scheduled for next year: Yes 01/16/24 @ 2:20pm in person

## 2024-01-13 NOTE — Progress Notes (Signed)
 Subjective:   Kevin Campbell is a 78 y.o. who presents for a Medicare Wellness preventive visit.  Visit Complete: In person  Persons Participating in Visit: Patient.  AWV Questionnaire: No: Patient Medicare AWV questionnaire was not completed prior to this visit.  Cardiac Risk Factors include: advanced age (>30men, >79 women);diabetes mellitus;dyslipidemia;hypertension;sedentary lifestyle;obesity (BMI >30kg/m2)     Objective:    Today's Vitals   01/13/24 1421  Weight: 233 lb 9.6 oz (106 kg)  Height: 5' 6.5" (1.689 m)   Body mass index is 37.14 kg/m.     01/13/2024    2:40 PM 12/20/2022    8:34 AM 12/14/2021    9:03 AM 06/03/2020   10:32 AM 05/22/2018    9:18 AM 05/10/2017    2:03 PM 02/16/2013    9:09 AM  Advanced Directives  Does Patient Have a Medical Advance Directive? Yes Yes No No No No Patient does not have advance directive  Type of Advance Directive Healthcare Power of [place];Living will Healthcare Power of [place];Living will       Copy of Healthcare Power of Attorney in Chart? No - copy requested No - copy requested       Would patient like information on creating a medical advance directive?   Yes (MAU/Ambulatory/Procedural Areas - Information given) Yes (MAU/Ambulatory/Procedural Areas - Information given) Yes (MAU/Ambulatory/Procedural Areas - Information given) Yes (MAU/Ambulatory/Procedural Areas - Information given)   Pre-existing out of facility DNR order (yellow form or pink MOST form)       No    Current Medications (verified) Outpatient Encounter Medications as of 01/13/2024  Medication Sig   amLODipine (NORVASC) 5 MG tablet Take 1 tablet (5 mg total) by mouth daily.   aspirin EC 81 MG tablet Take 81 mg by mouth daily.   atorvastatin (LIPITOR) 20 MG tablet Take 1 tablet (20 mg total) by mouth daily at 6 PM.   Blood Glucose Monitoring Suppl (ONETOUCH VERIO REFLECT) w/Device KIT Use as instructed to check blood sugar once a day   Cholecalciferol (VITAMIN  D3) 1.25 MG (50000 UT) TABS Take 1 tablet by mouth once a week.   Crisaborole (EUCRISA) 2 % OINT Apply to aa's hands QD.   cyanocobalamin (VITAMIN B12) 1000 MCG tablet Take 1 tablet (1,000 mcg total) by mouth daily.   glucose blood (ONETOUCH VERIO) test strip Use as instructed to check blood sugar once a day   Lancets (ONETOUCH DELICA PLUS LANCET33G) MISC Use as directed to check blood sugar once a day   mupirocin ointment (BACTROBAN) 2 % Apply 1 Application topically daily. Qd to excision site   omeprazole (PRILOSEC) 40 MG capsule Take 1 capsule (40 mg total) by mouth daily. Take daily for 2 weeks then as needed   triamcinolone cream (KENALOG) 0.1 % Apply 1 Application topically 2 (two) times daily. Apply to AA for no more than 10 days at a time.   No facility-administered encounter medications on file as of 01/13/2024.    Allergies (verified) Benadryl [diphenhydramine hcl], Celebrex [celecoxib], Codeine, Flomax [tamsulosin hcl], and Hydrocodone   History: Past Medical History:  Diagnosis Date   Allergy    Basal cell carcinoma 09/13/2022   right upper back, Excised 11/02/22   BCC (basal cell carcinoma) 11/09/2022   left posterior shoulder, EDC   BPH (benign prostatic hypertrophy)    Carpal tunnel syndrome on both sides    Cataract 05/22/2018   bilateral eyes   Colon polyps 2011   [name]   GERD (gastroesophageal  reflux disease)    watches diet   Hiatal hernia    Hypertension    Internal hemorrhoids    Nocturia    Obstructive sleep apnea    Overactive bladder    due to prostate seed implant radiation, failed myrbetriq, rapaflo, jalyn, now PTNS (McDiarmid)   Presbyesophagus    Prostate cancer (HCC) 08/24/2012   Adenocarcinoma,gleason=3+4=7,& 3+3=6,PSA=5.75,volume=23cc(5/12)cores positive; treated with XRT and radioactive seed implant, doing well ([place])   Rheumatoid arthritis (HCC)    of hands by Xray but RF, CCP, CRP negative (12/2013)   Seasonal allergies    Sleep apnea     pt did wear face mask with CPAP - but not tolerating mask now - not wearing C-PAP   Past Surgical History:  Procedure Laterality Date   CARDIOVASCULAR STRESS TEST  11-26-2010   Normal lexiscan nuclear study with inferior thinning but no ischemia   CLAVICLE SURGERY Left 1968   REMOVAL OF BONE SPUR FROM PREVIOUS FX YEARS AGO   COLONOSCOPY  2011   1 polyp sigmoid (hyperplastic), diverticulosis, int hemorrhoids [name])   KNEE ARTHROSCOPY Left JAN 2013   KNEE ARTHROSCOPY Right 12/2017   Dr. [facility]   PROSTATE BIOPSY  08/24/12   Adenocarcinoma   RADIOACTIVE SEED IMPLANT N/A 02/16/2013   Procedure: RADIOACTIVE PROSTATE SEED IMPLANT;  Surgeon: Garnett Farm, MD   SHOULDER ARTHROSCOPY WITH BICEPS TENDON REPAIR Right APRIL 2013   TOTAL KNEE ARTHROPLASTY  09/26/2012   Procedure: TOTAL KNEE ARTHROPLASTY;  Surgeon: Eugenia Mcalpine, MD;  Location: [facility];  Service: Orthopedics;  Laterality: Left;   Family History  Problem Relation Age of Onset   Coronary artery disease Mother 89       3vCABG   Arthritis Mother    Rheumatologic disease Mother    CAD Mother    Cancer Father        lung, smoker   Cancer Sister        twin sister - breast   Stroke Neg Hx    Diabetes Neg Hx    Colon cancer Neg Hx    Rectal cancer Neg Hx    Stomach cancer Neg Hx    Social History   Socioeconomic History   Marital status: Widowed    Spouse name: Not on file   Number of children: Not on file   Years of education: Not on file   Highest education level: Not on file  Occupational History   Not on file  Tobacco Use   Smoking status: Former    Types: Cigars    Quit date: 10/11/1990    Years since quitting: 33.2   Smokeless tobacco: Never   Tobacco comments:    usually  chewed on cigars  Vaping Use   Vaping status: Never Used  Substance and Sexual Activity   Alcohol use: No   Drug use: No   Sexual activity: Not Currently  Other Topics Concern   Not on file  Social History  Narrative   Caffeine: 2 cups coffee occasionally, soda throughout the day   Lives with wife, no pets.  2 grown children   Occupation: truck driver   Edu: HS   Activity: sedentary   Diet: rare water, good fruits and vegetables   Social Drivers of Corporate investment banker Strain: Low Risk  (01/13/2024)   Overall Financial Resource Strain (CARDIA)    Difficulty of Paying Living Expenses: Not hard at all  Food Insecurity: No Food Insecurity (01/13/2024)  Hunger Vital Sign    Worried About Running Out of Food in the Last Year: Never true    Ran Out of Food in the Last Year: Never true  Transportation Needs: No Transportation Needs (01/13/2024)   PRAPARE - Administrator, Civil Service (Medical): No    Lack of Transportation (Non-Medical): No  Physical Activity: Inactive (01/13/2024)   Exercise Vital Sign    Days of Exercise per Week: 0 days    Minutes of Exercise per Session: 0 min  Stress: No Stress Concern Present (01/13/2024)   [employer] of Occupational Health - Occupational Stress Questionnaire    Feeling of Stress : Not at all  Social Connections: Socially Isolated (01/13/2024)   Social Connection and Isolation Panel [NHANES]    Frequency of Communication with Friends and Family: Three times a week    Frequency of Social Gatherings with Friends and Family: Three times a week    Attends Religious Services: Never    Active Member of Clubs or Organizations: No    Attends Banker Meetings: Never    Marital Status: Widowed    Tobacco Counseling Counseling given: Not Answered Tobacco comments: usually  chewed on cigars    Clinical Intake:  Pre-visit preparation completed: Yes  Pain : No/denies pain     BMI - recorded: 37.14 Nutritional Status: BMI > 30  Obese Nutritional Risks: None Diabetes: No  Lab Results  Component Value Date   HGBA1C 6.0 (A) 08/16/2023   HGBA1C 6.3 12/28/2022   HGBA1C 6.0 (A) 09/22/2022     How often do you need  to have someone help you when you read instructions, pamphlets, or other written materials from your doctor or pharmacy?: 1 - Never  Interpreter Needed?: No  Comments: lives alone with dog Information entered by :: B.Lakrisha Iseman,LPN   Activities of Daily Living     01/13/2024    2:41 PM  In your present state of health, do you have any difficulty performing the following activities:  Hearing? 0  Vision? 0  Difficulty concentrating or making decisions? 0  Walking or climbing stairs? 0  Dressing or bathing? 0  Doing errands, shopping? 0  Preparing Food and eating ? N  Using the Toilet? N  In the past six months, have you accidently leaked urine? N  Do you have problems with loss of bowel control? N  Managing your Medications? N  Managing your Finances? N  Housekeeping or managing your Housekeeping? N    Patient Care Team: Eustaquio Boyden, MD as PCP - General (Family Medicine) Mia Creek, MD as Consulting Physician (Ophthalmology)  Indicate any recent Medical Services you may have received from other than [facility] providers in the past year (date may be approximate).     Assessment:   This is a routine wellness examination for Williams.  Hearing/Vision screen Hearing Screening - Comments:: Pt says his hearing is pretty good Vision Screening - Comments:: Pt says his vision is good;readers only Dr Darel Hong   Goals Addressed             This Visit's Progress    Patient Stated   On track    01/13/24, I will continue to take medications as prescribed.      COMPLETED: Patient Stated       06/03/2020, I will maintain and continue medications as prescribed.      Patient Stated   Not on track    01/13/24-Would like to start exercising  Patient Stated   On track    Keep weight down        Depression Screen     01/13/2024    2:32 PM 08/16/2023   11:35 AM 05/27/2023   12:07 PM 01/04/2023    8:43 AM 12/20/2022    8:33 AM 12/14/2021    9:07 AM 06/03/2020   10:34 AM  PHQ 2/9  Scores  PHQ - 2 Score 0 0 0 0 0 0 0  PHQ- 9 Score       0    Fall Risk     01/13/2024    2:23 PM 08/16/2023   11:35 AM 05/27/2023   12:07 PM 12/20/2022    8:35 AM 12/14/2021    9:06 AM  Fall Risk   Falls in the past year? 1 1 1  0 0  Number falls in past yr: 0 0 0 0 0  Injury with Fall? 1 0 1 0 0  Comment bruised ribs:seen in office      Risk for fall due to : No Fall Risks   Impaired vision   Follow up Education provided;Falls prevention discussed   Falls prevention discussed Falls prevention discussed    MEDICARE RISK AT HOME:  Medicare Risk at Home Any stairs in or around the home?: No If so, are there any without handrails?: No Home free of loose throw rugs in walkways, pet beds, electrical cords, etc?: Yes Adequate lighting in your home to reduce risk of falls?: Yes Life alert?: No Use of a cane, walker or w/c?: No Grab bars in the bathroom?: Yes Shower chair or bench in shower?: Yes Elevated toilet seat or a handicapped toilet?: Yes  TIMED UP AND GO:  Was the test performed?  Yes  Length of time to ambulate 10 feet: 10 sec Gait steady and fast without use of assistive device  Cognitive Function: 6CIT completed    06/03/2020   10:36 AM 05/22/2018    9:18 AM 05/10/2017    2:07 PM  MMSE - Mini Mental State Exam  Orientation to time 5 5 5   Orientation to Place 5 5 5   Registration 3 3 3   Attention/ Calculation 5 0 0  Recall 3 3 3   Language- name 2 objects  0 0  Language- repeat 1 1 1   Language- follow 3 step command  3 3  Language- read & follow direction  0 0  Write a sentence  0 0  Copy design  0 0  Total score  20 20        01/13/2024    2:43 PM 12/20/2022    8:36 AM  6CIT Screen  What Year? 0 points 0 points  What month? 0 points 0 points  What time? 0 points 0 points  Count back from 20 0 points 0 points  Months in reverse 0 points 0 points  Repeat phrase 4 points 0 points  Total Score 4 points 0 points    Immunizations Immunization History   Administered Date(s) Administered   Fluad Quad(high Dose 65+) 06/23/2022   Influenza Whole 06/22/2012   Influenza, High Dose Seasonal PF 08/01/2017   Influenza-Unspecified 07/08/2014, 07/12/2016   Pneumococcal Conjugate-13 05/22/2018   Pneumococcal Polysaccharide-23 10/18/2011, 05/30/2019   Td 10/17/2006    Screening Tests Health Maintenance  Topic Date Due   COVID-19 Vaccine (1) Never done   Zoster Vaccines- Shingrix (1 of 2) Never done   DTaP/Tdap/Td (2 - Tdap) 10/17/2016   Diabetic kidney evaluation - Urine  ACR  12/28/2023   OPHTHALMOLOGY EXAM  12/30/2023   HEMOGLOBIN A1C  02/13/2024   INFLUENZA VACCINE  05/11/2024   FOOT EXAM  08/15/2024   Diabetic kidney evaluation - eGFR measurement  09/14/2024   Medicare Annual Wellness (AWV)  01/12/2025   Fecal DNA (Cologuard)  06/26/2026   Pneumonia Vaccine 55+ Years old  Completed   Hepatitis C Screening  Completed   HPV VACCINES  Aged Out   Colonoscopy  Discontinued    Health Maintenance  Health Maintenance Due  Topic Date Due   COVID-19 Vaccine (1) Never done   Zoster Vaccines- Shingrix (1 of 2) Never done   DTaP/Tdap/Td (2 - Tdap) 10/17/2016   Diabetic kidney evaluation - Urine ACR  12/28/2023   OPHTHALMOLOGY EXAM  12/30/2023   Health Maintenance Items Addressed:  Additional Screening:  Vision Screening: Recommended annual ophthalmology exams for early detection of glaucoma and other disorders of the eye.  Dental Screening: Recommended annual dental exams for proper oral hygiene  Community Resource Referral / Chronic Care Management: CRR required this visit?  No   CCM required this visit?  Appt scheduled with PCP     Plan:     I have personally reviewed and noted the following in the patient's chart:   Medical and social history Use of alcohol, tobacco or illicit drugs  Current medications and supplements including opioid prescriptions. Patient is not currently taking opioid prescriptions. Functional  ability and status Nutritional status Physical activity Advanced directives List of other physicians Hospitalizations, surgeries, and ER visits in previous 12 months Vitals Screenings to include cognitive, depression, and falls Referrals and appointments  In addition, I have reviewed and discussed with patient certain preventive protocols, quality metrics, and best practice recommendations. A written personalized care plan for preventive services as well as general preventive health recommendations were provided to patient.    Sue Lush, LPN   05/17/5642   After Visit Summary: (Declined) Due to this being a telephonic visit, with patients personalized plan was offered to patient but patient Declined AVS at this time   Notes: Nothing significant to report at this time.

## 2024-01-30 NOTE — Progress Notes (Deleted)
Per orders of Dr. Javier Gutierrez, injection of B-12 given by Joellen Y Thompson in right deltoid. Patient tolerated injection well. Patient will make appointment for 1 month.    

## 2024-01-31 ENCOUNTER — Ambulatory Visit

## 2024-01-31 DIAGNOSIS — E538 Deficiency of other specified B group vitamins: Secondary | ICD-10-CM

## 2024-03-01 ENCOUNTER — Ambulatory Visit (INDEPENDENT_AMBULATORY_CARE_PROVIDER_SITE_OTHER): Admitting: Family

## 2024-03-01 VITALS — BP 166/94 | HR 60 | Temp 98.2°F | Ht 66.5 in | Wt 236.4 lb

## 2024-03-01 DIAGNOSIS — I10 Essential (primary) hypertension: Secondary | ICD-10-CM | POA: Diagnosis not present

## 2024-03-01 DIAGNOSIS — R21 Rash and other nonspecific skin eruption: Secondary | ICD-10-CM | POA: Diagnosis not present

## 2024-03-01 DIAGNOSIS — L255 Unspecified contact dermatitis due to plants, except food: Secondary | ICD-10-CM | POA: Insufficient documentation

## 2024-03-01 DIAGNOSIS — L989 Disorder of the skin and subcutaneous tissue, unspecified: Secondary | ICD-10-CM | POA: Diagnosis not present

## 2024-03-01 DIAGNOSIS — E538 Deficiency of other specified B group vitamins: Secondary | ICD-10-CM

## 2024-03-01 MED ORDER — TRIAMCINOLONE ACETONIDE 0.1 % EX CREA: 1.0000 | TOPICAL_CREAM | Freq: Two times a day (BID) | CUTANEOUS | 1 refills | Status: DC

## 2024-03-01 MED ORDER — CYANOCOBALAMIN 1000 MCG/ML IJ SOLN
1000.0000 ug | Freq: Once | INTRAMUSCULAR | Status: AC
  Administered 2024-03-01: 1000 ug via INTRAMUSCULAR

## 2024-03-01 MED ORDER — PREDNISONE 20 MG PO TABS: ORAL_TABLET | ORAL | 0 refills | Status: DC

## 2024-03-01 MED ORDER — CLOBETASOL PROPIONATE 0.05 % EX CREA: 1.0000 | TOPICAL_CREAM | Freq: Two times a day (BID) | CUTANEOUS | 0 refills | Status: DC

## 2024-03-01 MED ORDER — AMLODIPINE BESYLATE 10 MG PO TABS: 10.0000 mg | ORAL_TABLET | Freq: Every day | ORAL | 0 refills | Status: DC

## 2024-03-01 NOTE — Assessment & Plan Note (Signed)
 Suspect poison ivy/oak Rx prednisone  taper  Triamcinolone  cream 0.1% Monitor for secondary infection  With prednisone  use caution for bleeding precautions  Continue with PPI

## 2024-03-01 NOTE — Assessment & Plan Note (Signed)
 Suspect psoriasis right upper arm, clobetasol  for prn use.  However advised to wait use until skin dermatitis is resolved.  Advised pt since intermittently chronic to also f/u with dermatologist whom he is established with.

## 2024-03-01 NOTE — Progress Notes (Signed)
 Called pt and schedule appt for 2 week f/u with Dr. Crissie Dome for his blood pressure

## 2024-03-01 NOTE — Assessment & Plan Note (Signed)
-   B12 injection today in office

## 2024-03-01 NOTE — Progress Notes (Signed)
 Established Patient Office Visit  Subjective:   Patient ID: Kevin Campbell, male    DOB: 1946/09/03  Age: 78 y.o. MRN: [id number]  CC:  Chief Complaint  Patient presents with   Acute Visit    Rash     HPI: Kevin Campbell is a 78 y.o. male presenting on 03/01/2024 for Acute Visit (Rash )  HTN: today elevated blood pressure, at home with average around 150/160. On amlodipine  5 mg   Has noticed a rash on right lateral arm, raised red that comes and goes over the last six months that often doesn't itch but does recently after exposure with bushes.   On his left arm he also has some red spots with raised red lesions, which he noticed after trimming bushes a few days ago. He does state the rash is itchy. He has used some otc remedies, antibacterial soap as well as some otc creams that have helped slightly. Cortizone hasn't been overly helpful.   Vitamin b12 def: overdue for his April b12 injection.         ROS: Negative unless specifically indicated above in HPI.   Relevant past medical history reviewed and updated as indicated.   Allergies and medications reviewed and updated.   Current Outpatient Medications:    amLODipine  (NORVASC ) 10 MG tablet, Take 1 tablet (10 mg total) by mouth daily., Disp: 90 tablet, Rfl: 0   aspirin  EC 81 MG tablet, Take 81 mg by mouth daily., Disp: , Rfl:    atorvastatin  (LIPITOR) 20 MG tablet, Take 1 tablet (20 mg total) by mouth daily at 6 PM., Disp: 90 tablet, Rfl: 4   Cholecalciferol (VITAMIN D3) 1.25 MG (50000 UT) TABS, Take 1 tablet by mouth once a week., Disp: 12 tablet, Rfl: 1   clobetasol  cream (TEMOVATE ) 0.05 %, Apply 1 Application topically 2 (two) times daily., Disp: 30 g, Rfl: 0   Crisaborole  (EUCRISA ) 2 % OINT, Apply to aa's hands QD., Disp: 100 g, Rfl: 1   cyanocobalamin  (VITAMIN B12) 1000 MCG tablet, Take 1 tablet (1,000 mcg total) by mouth daily., Disp: , Rfl:    mupirocin  ointment (BACTROBAN ) 2 %, Apply 1 Application topically  daily. Qd to excision site, Disp: 22 g, Rfl: 0   omeprazole  (PRILOSEC) 40 MG capsule, Take 1 capsule (40 mg total) by mouth daily. Take daily for 2 weeks then as needed, Disp: 30 capsule, Rfl: 1   predniSONE  (DELTASONE ) 20 MG tablet, Take two tablets po qd for five days, one tablet po qd for five days, then 1/2 tablet po qd for five days, Disp: 18 tablet, Rfl: 0   triamcinolone  cream (KENALOG ) 0.1 %, Apply 1 Application topically 2 (two) times daily. Apply to AA for no more than 10 days at a time., Disp: 45 g, Rfl: 1  Allergies  Allergen Reactions   Benadryl [Diphenhydramine Hcl] Shortness Of Breath   Celebrex [Celecoxib] Other (See Comments)    Extreme drowsiness   Codeine Other (See Comments)    Extreme fatigue; dizziness   Flomax  [Tamsulosin  Hcl] Other (See Comments)    "can't sleep"   Hydrocodone  Other (See Comments)    Drunk feeling    Objective:   BP (!) 166/94 (BP Location: Left Arm, Patient Position: Sitting, Cuff Size: Large)   Pulse 60   Temp 98.2 F (36.8 C) (Temporal)   Ht 5' 6.5" (1.689 m)   Wt 236 lb 6.4 oz (107.2 kg)   SpO2 97%   BMI 37.58 kg/m  Physical Exam Constitutional:      General: He is not in acute distress.    Appearance: Normal appearance. He is normal weight. He is not ill-appearing, toxic-appearing or diaphoretic.  Cardiovascular:     Rate and Rhythm: Normal rate.  Pulmonary:     Effort: Pulmonary effort is normal.  Musculoskeletal:        General: Normal range of motion.  Skin:    Comments: Left upper arm with raised papular red lesions with erythema and slight warmth localized. Also with raised erythema bil lower arms. Right lateral upper arm with raised erythematic rash with scaly texture and slight silver scaling  Neurological:     General: No focal deficit present.     Mental Status: He is alert and oriented to person, place, and time. Mental status is at baseline.  Psychiatric:        Mood and Affect: Mood normal.        Behavior:  Behavior normal.        Thought Content: Thought content normal.        Judgment: Judgment normal.         Assessment & Plan:  Essential hypertension Assessment & Plan: Increase amlodipine  to 10 mg once daily Pt advised of the following:  Continue medication as prescribed. Monitor blood pressure periodically and/or when you feel symptomatic. Goal is <130/90 on average. Ensure that you have rested for 30 minutes prior to checking your blood pressure. Record your readings and bring them to your next visit if necessary.work on a low sodium diet. F/u with pcp in two weeks with log  Orders: -     amLODIPine  Besylate; Take 1 tablet (10 mg total) by mouth daily.  Dispense: 90 tablet; Refill: 0  Skin eruption  Skin eruption resembling psoriasis Assessment & Plan: Suspect psoriasis right upper arm, clobetasol  for prn use.  However advised to wait use until skin dermatitis is resolved.  Advised pt since intermittently chronic to also f/u with dermatologist whom he is established with.  Orders: -     Clobetasol  Propionate; Apply 1 Application topically 2 (two) times daily.  Dispense: 30 g; Refill: 0  Contact dermatitis due to plant Assessment & Plan: Suspect poison ivy/oak Rx prednisone  taper  Triamcinolone  cream 0.1% Monitor for secondary infection  With prednisone  use caution for bleeding precautions  Continue with PPI   Orders: -     Triamcinolone  Acetonide; Apply 1 Application topically 2 (two) times daily. Apply to AA for no more than 10 days at a time.  Dispense: 45 g; Refill: 1 -     predniSONE ; Take two tablets po qd for five days, one tablet po qd for five days, then 1/2 tablet po qd for five days  Dispense: 18 tablet; Refill: 0  Vitamin B12 deficiency Assessment & Plan: B12 injection today in office.    Orders: -     Cyanocobalamin      Follow up plan: Return in about 2 weeks (around 03/15/2024) for f/u blood pressure.  Felicita Horns, FNP

## 2024-03-01 NOTE — Assessment & Plan Note (Signed)
 Increase amlodipine  to 10 mg once daily Pt advised of the following:  Continue medication as prescribed. Monitor blood pressure periodically and/or when you feel symptomatic. Goal is <130/90 on average. Ensure that you have rested for 30 minutes prior to checking your blood pressure. Record your readings and bring them to your next visit if necessary.work on a low sodium diet. F/u with pcp in two weeks with log

## 2024-03-14 ENCOUNTER — Ambulatory Visit: Payer: PPO | Admitting: Dermatology

## 2024-03-15 ENCOUNTER — Encounter: Payer: Self-pay | Admitting: Dermatology

## 2024-03-15 ENCOUNTER — Ambulatory Visit: Admitting: Dermatology

## 2024-03-15 DIAGNOSIS — L814 Other melanin hyperpigmentation: Secondary | ICD-10-CM | POA: Diagnosis not present

## 2024-03-15 DIAGNOSIS — S40812A Abrasion of left upper arm, initial encounter: Secondary | ICD-10-CM

## 2024-03-15 DIAGNOSIS — Z85828 Personal history of other malignant neoplasm of skin: Secondary | ICD-10-CM | POA: Diagnosis not present

## 2024-03-15 DIAGNOSIS — D1801 Hemangioma of skin and subcutaneous tissue: Secondary | ICD-10-CM

## 2024-03-15 DIAGNOSIS — L570 Actinic keratosis: Secondary | ICD-10-CM | POA: Diagnosis not present

## 2024-03-15 DIAGNOSIS — T148XXA Other injury of unspecified body region, initial encounter: Secondary | ICD-10-CM | POA: Diagnosis not present

## 2024-03-15 DIAGNOSIS — L821 Other seborrheic keratosis: Secondary | ICD-10-CM

## 2024-03-15 DIAGNOSIS — W908XXA Exposure to other nonionizing radiation, initial encounter: Secondary | ICD-10-CM

## 2024-03-15 DIAGNOSIS — T07XXXA Unspecified multiple injuries, initial encounter: Secondary | ICD-10-CM

## 2024-03-15 DIAGNOSIS — D229 Melanocytic nevi, unspecified: Secondary | ICD-10-CM

## 2024-03-15 DIAGNOSIS — L578 Other skin changes due to chronic exposure to nonionizing radiation: Secondary | ICD-10-CM

## 2024-03-15 DIAGNOSIS — Z1283 Encounter for screening for malignant neoplasm of skin: Secondary | ICD-10-CM

## 2024-03-15 DIAGNOSIS — S40811A Abrasion of right upper arm, initial encounter: Secondary | ICD-10-CM

## 2024-03-15 DIAGNOSIS — L820 Inflamed seborrheic keratosis: Secondary | ICD-10-CM

## 2024-03-15 NOTE — Progress Notes (Signed)
 Follow-Up Visit   Subjective  Kevin Campbell is a 78 y.o. male who presents for the following: Skin Cancer Screening and Full Body Skin Exam  The patient presents for Total-Body Skin Exam (TBSE) for skin cancer screening and mole check. The patient has spots, moles and lesions to be evaluated, some may be new or changing and the patient may have concern these could be cancer.  Hx BCC. Spots at back itching.  The following portions of the chart were reviewed this encounter and updated as appropriate: medications, allergies, medical history  Review of Systems:  No other skin or systemic complaints except as noted in HPI or Assessment and Plan.  Objective  Well appearing patient in no apparent distress; mood and affect are within normal limits.  A full examination was performed including scalp, head, eyes, ears, nose, lips, neck, chest, axillae, abdomen, back, buttocks, bilateral upper extremities, bilateral lower extremities, hands, feet, fingers, toes, fingernails, and toenails. All findings within normal limits unless otherwise noted below.   Relevant physical exam findings are noted in the Assessment and Plan.  face, ears x 4 (4) Erythematous thin papules/macules with gritty scale.  trunk and scalp x 20 (20) Erythematous stuck-on, waxy papule or plaque B/L arms Excoriations 2ndary to resolving poison oak  Assessment & Plan   SKIN CANCER SCREENING PERFORMED TODAY.  ACTINIC DAMAGE - Chronic condition, secondary to cumulative UV/sun exposure - diffuse scaly erythematous macules with underlying dyspigmentation - Recommend daily broad spectrum sunscreen SPF 30+ to sun-exposed areas, reapply every 2 hours as needed.  - Staying in the shade or wearing long sleeves, sun glasses (UVA+UVB protection) and wide brim hats (4-inch brim around the entire circumference of the hat) are also recommended for sun protection.  - Call for new or changing lesions.  LENTIGINES, SEBORRHEIC  KERATOSES, HEMANGIOMAS - Benign normal skin lesions - Benign-appearing - Call for any changes  MELANOCYTIC NEVI - Tan-brown and/or pink-flesh-colored symmetric macules and papules - Benign appearing on exam today - Observation - Call clinic for new or changing moles - Recommend daily use of broad spectrum spf 30+ sunscreen to sun-exposed areas.   Aaron AasHISTORY OF BASAL CELL CARCINOMA OF THE SKIN - No evidence of recurrence today - Recommend regular full body skin exams - Recommend daily broad spectrum sunscreen SPF 30+ to sun-exposed areas, reapply every 2 hours as needed.  - Call if any new or changing lesions are noted between office visits  AK (ACTINIC KERATOSIS) (4) face, ears x 4 (4) Actinic keratoses are precancerous spots that appear secondary to cumulative UV radiation exposure/sun exposure over time. They are chronic with expected duration over 1 year. A portion of actinic keratoses will progress to squamous cell carcinoma of the skin. It is not possible to reliably predict which spots will progress to skin cancer and so treatment is recommended to prevent development of skin cancer.  Recommend daily broad spectrum sunscreen SPF 30+ to sun-exposed areas, reapply every 2 hours as needed.  Recommend staying in the shade or wearing long sleeves, sun glasses (UVA+UVB protection) and wide brim hats (4-inch brim around the entire circumference of the hat). Call for new or changing lesions. Destruction of lesion - face, ears x 4 (4) Complexity: simple   Destruction method: cryotherapy   Informed consent: discussed and consent obtained   Timeout:  patient name, date of birth, surgical site, and procedure verified Lesion destroyed using liquid nitrogen: Yes   Region frozen until ice ball extended beyond lesion: Yes  Outcome: patient tolerated procedure well with no complications   Post-procedure details: wound care instructions given   INFLAMED SEBORRHEIC KERATOSIS (20) trunk and scalp  x 20 (20) Symptomatic, irritating, patient would like treated.  Benign-appearing.  Call clinic for new or changing lesions.   Destruction of lesion - trunk and scalp x 20 (20) Complexity: simple   Destruction method: cryotherapy   Informed consent: discussed and consent obtained   Timeout:  patient name, date of birth, surgical site, and procedure verified Lesion destroyed using liquid nitrogen: Yes   Region frozen until ice ball extended beyond lesion: Yes   Outcome: patient tolerated procedure well with no complications   Post-procedure details: wound care instructions given   EXCORIATION B/L arms Return in about 1 year (around 03/15/2025) for TBSE, with Dr. Linnell Richardson, [facility].  Kerstin Peeling, RMA, am acting as scribe for Celine Collard, MD .   Documentation: I have reviewed the above documentation for accuracy and completeness, and I agree with the above.  Celine Collard, MD

## 2024-03-15 NOTE — Patient Instructions (Addendum)

## 2024-04-03 ENCOUNTER — Encounter: Payer: Self-pay | Admitting: Family Medicine

## 2024-04-03 ENCOUNTER — Ambulatory Visit (INDEPENDENT_AMBULATORY_CARE_PROVIDER_SITE_OTHER): Admitting: Family Medicine

## 2024-04-03 ENCOUNTER — Ambulatory Visit

## 2024-04-03 VITALS — BP 128/86 | HR 71 | Temp 98.2°F | Ht 66.5 in | Wt 231.2 lb

## 2024-04-03 DIAGNOSIS — E538 Deficiency of other specified B group vitamins: Secondary | ICD-10-CM

## 2024-04-03 DIAGNOSIS — I10 Essential (primary) hypertension: Secondary | ICD-10-CM | POA: Diagnosis not present

## 2024-04-03 MED ORDER — AMLODIPINE BESYLATE 10 MG PO TABS: 10.0000 mg | ORAL_TABLET | Freq: Every day | ORAL | 3 refills | Status: AC

## 2024-04-03 MED ORDER — CYANOCOBALAMIN 1000 MCG/ML IJ SOLN
1000.0000 ug | Freq: Once | INTRAMUSCULAR | Status: AC
  Administered 2024-04-03: 1000 ug via INTRAMUSCULAR

## 2024-04-03 NOTE — Assessment & Plan Note (Signed)
 B12 shot today.

## 2024-04-03 NOTE — Patient Instructions (Addendum)
 B12 shot today  Blood pressure is doing better- continue higher amlodipine  10mg  dose.  We will recheck you at next month's appointment.

## 2024-04-03 NOTE — Progress Notes (Signed)
 Ph: [phone] Fax: [phone]   Patient ID: Kevin Campbell, male    DOB: 1946/02/27, 78 y.o.   MRN: [id number]  This visit was conducted in person.  BP 128/86 (BP Location: Right Arm, Cuff Size: Normal)   Pulse 71   Temp 98.2 F (36.8 C) (Oral)   Ht 5' 6.5 (1.689 m)   Wt 231 lb 4 oz (104.9 kg)   SpO2 94%   BMI 36.77 kg/m     CC: HTN follow up visit  Subjective:   HPI: [name] is a 78 y.o. male presenting on 04/03/2024 for Medical Management of Chronic Issues (Here for 2 wk HTN f/u.)   See prior note for details.  Saw Tabitha last month, note reviewed. Had noted elevated blood pressures to 150-160s SBP despite amlodipine  5mg  daily. This was increased to 10mg  daily. Nadir BP since increased dose 101/60s - this has been rare.  Overall asymptomatic with low BP readings.  BP this morning 140/75 at home.   Also noted rash to R>L arms - rx prednisone  for possible poison ivy dermatitis, ?psoriasis. Rash fully resolved.   Since increased amlodipine  dose 10mg , notes improved BP control - tolerating well without ankle swelling or flushing.  Occ chest pain described as acid reflux/heartburn. Not on omeprazole .  No HA, vision changes, CP/tightness, SOB, leg swelling.  Notes ongoing dysphagia to solid foods. Notes EGD with empiric dilation back in 09/2020 ([facility]) didn't really help.      Relevant past medical, surgical, family and social history reviewed and updated as indicated. Interim medical history since our last visit reviewed. Allergies and medications reviewed and updated. Outpatient Medications Prior to Visit  Medication Sig Dispense Refill   aspirin  EC 81 MG tablet Take 81 mg by mouth daily.     atorvastatin  (LIPITOR) 20 MG tablet Take 1 tablet (20 mg total) by mouth daily at 6 PM. 90 tablet 4   Cholecalciferol (VITAMIN D3) 1.25 MG (50000 UT) TABS Take 1 tablet by mouth once a week. 12 tablet 1   Crisaborole  (EUCRISA ) 2 % OINT Apply to aa's hands QD. 100  g 1   cyanocobalamin  (VITAMIN B12) 1000 MCG tablet Take 1 tablet (1,000 mcg total) by mouth daily.     mupirocin  ointment (BACTROBAN ) 2 % Apply 1 Application topically daily. Qd to excision site 22 g 0   omeprazole  (PRILOSEC) 40 MG capsule Take 1 capsule (40 mg total) by mouth daily. Take daily for 2 weeks then as needed 30 capsule 1   triamcinolone  cream (KENALOG ) 0.1 % Apply 1 Application topically 2 (two) times daily. Apply to AA for no more than 10 days at a time. 45 g 1   amLODipine  (NORVASC ) 10 MG tablet Take 1 tablet (10 mg total) by mouth daily. 90 tablet 0   clobetasol  cream (TEMOVATE ) 0.05 % Apply 1 Application topically 2 (two) times daily. 30 g 0   predniSONE  (DELTASONE ) 20 MG tablet Take two tablets po qd for five days, one tablet po qd for five days, then 1/2 tablet po qd for five days 18 tablet 0   No facility-administered medications prior to visit.     Per HPI unless specifically indicated in ROS section below Review of Systems  Objective:  BP 128/86 (BP Location: Right Arm, Cuff Size: Normal)   Pulse 71   Temp 98.2 F (36.8 C) (Oral)   Ht 5' 6.5 (1.689 m)   Wt 231 lb 4 oz (104.9 kg)   SpO2 94%  BMI 36.77 kg/m   Wt Readings from Last 3 Encounters:  04/03/24 231 lb 4 oz (104.9 kg)  03/01/24 236 lb 6.4 oz (107.2 kg)  01/13/24 233 lb 9.6 oz (106 kg)      Physical Exam Vitals and nursing note reviewed.  Constitutional:      Appearance: Normal appearance. He is not ill-appearing.  HENT:     Head: Normocephalic and atraumatic.     Mouth/Throat:     Mouth: Mucous membranes are moist.     Pharynx: Oropharynx is clear. No oropharyngeal exudate or posterior oropharyngeal erythema.   Eyes:     Extraocular Movements: Extraocular movements intact.     Pupils: Pupils are equal, round, and reactive to light.    Cardiovascular:     Rate and Rhythm: Normal rate and regular rhythm.     Pulses: Normal pulses.     Heart sounds: Normal heart sounds. No murmur  heard. Pulmonary:     Effort: Pulmonary effort is normal. No respiratory distress.     Breath sounds: Normal breath sounds. No wheezing, rhonchi or rales.   Musculoskeletal:     Cervical back: Normal range of motion and neck supple.     Right lower leg: No edema.     Left lower leg: No edema.   Skin:    General: Skin is warm and dry.     Findings: No rash.   Neurological:     Mental Status: He is alert.   Psychiatric:        Mood and Affect: Mood normal.        Behavior: Behavior normal.       Results for orders placed or performed in visit on 09/15/23  Renal function panel   Collection Time: 09/15/23  8:45 AM  Result Value Ref Range   Sodium 141 135 - 145 mEq/L   Potassium 4.3 3.5 - 5.1 mEq/L   Chloride 106 96 - 112 mEq/L   CO2 28 19 - 32 mEq/L   Albumin 4.0 3.5 - 5.2 g/dL   BUN 22 6 - 23 mg/dL   Creatinine, Ser 8.62 0.40 - 1.50 mg/dL   Glucose, Bld 887 (H) 70 - 99 mg/dL   Phosphorus 3.6 2.3 - 4.6 mg/dL   GFR 49.36 (L) >39.99 mL/min   Calcium  9.2 8.4 - 10.5 mg/dL  VITAMIN D  25 Hydroxy (Vit-D Deficiency, Fractures)   Collection Time: 09/15/23  8:45 AM  Result Value Ref Range   VITD 33.14 30.00 - 100.00 ng/mL  Vitamin B12   Collection Time: 09/15/23  8:45 AM  Result Value Ref Range   Vitamin B-12 292 211 - 911 pg/mL    Assessment & Plan:   Problem List Items Addressed This Visit     Essential hypertension - Primary   Amlodipine  increased to 10mg  last visit with overall improved control, tolerating well without significant side effects. Continue this regimen. Reviewed importance of good hydration status in setting of upcoming hot summer months.  Reassess control at CPE next month.       Relevant Medications   amLODipine  (NORVASC ) 10 MG tablet   Vitamin B12 deficiency   B12 shot today         Meds ordered this encounter  Medications   cyanocobalamin  (VITAMIN B12) injection 1,000 mcg   amLODipine  (NORVASC ) 10 MG tablet    Sig: Take 1 tablet (10 mg  total) by mouth daily.    Dispense:  90 tablet    Refill:  3  No orders of the defined types were placed in this encounter.   Patient Instructions  B12 shot today  Blood pressure is doing better- continue higher amlodipine  10mg  dose.  We will recheck you at next month's appointment.   Follow up plan: Return if symptoms worsen or fail to improve.  Anton Blas, MD

## 2024-04-03 NOTE — Assessment & Plan Note (Addendum)
 Amlodipine  increased to 10mg  last visit with overall improved control, tolerating well without significant side effects. Continue this regimen. Reviewed importance of good hydration status in setting of upcoming hot summer months.  Reassess control at CPE next month.

## 2024-04-18 ENCOUNTER — Ambulatory Visit: Admitting: Family Medicine

## 2024-04-18 ENCOUNTER — Encounter: Payer: Self-pay | Admitting: Family Medicine

## 2024-04-18 VITALS — BP 138/72 | HR 60 | Temp 98.0°F | Ht 66.25 in | Wt 231.4 lb

## 2024-04-18 DIAGNOSIS — C61 Malignant neoplasm of prostate: Secondary | ICD-10-CM | POA: Diagnosis not present

## 2024-04-18 DIAGNOSIS — G4733 Obstructive sleep apnea (adult) (pediatric): Secondary | ICD-10-CM

## 2024-04-18 DIAGNOSIS — Z0000 Encounter for general adult medical examination without abnormal findings: Secondary | ICD-10-CM | POA: Diagnosis not present

## 2024-04-18 DIAGNOSIS — Z6837 Body mass index (BMI) 37.0-37.9, adult: Secondary | ICD-10-CM | POA: Diagnosis not present

## 2024-04-18 DIAGNOSIS — N1831 Chronic kidney disease, stage 3a: Secondary | ICD-10-CM | POA: Diagnosis not present

## 2024-04-18 DIAGNOSIS — E559 Vitamin D deficiency, unspecified: Secondary | ICD-10-CM | POA: Diagnosis not present

## 2024-04-18 DIAGNOSIS — E785 Hyperlipidemia, unspecified: Secondary | ICD-10-CM

## 2024-04-18 DIAGNOSIS — E1169 Type 2 diabetes mellitus with other specified complication: Secondary | ICD-10-CM

## 2024-04-18 DIAGNOSIS — E538 Deficiency of other specified B group vitamins: Secondary | ICD-10-CM

## 2024-04-18 DIAGNOSIS — R7303 Prediabetes: Secondary | ICD-10-CM

## 2024-04-18 DIAGNOSIS — L255 Unspecified contact dermatitis due to plants, except food: Secondary | ICD-10-CM

## 2024-04-18 DIAGNOSIS — K219 Gastro-esophageal reflux disease without esophagitis: Secondary | ICD-10-CM

## 2024-04-18 DIAGNOSIS — I10 Essential (primary) hypertension: Secondary | ICD-10-CM | POA: Diagnosis not present

## 2024-04-18 DIAGNOSIS — Z7189 Other specified counseling: Secondary | ICD-10-CM

## 2024-04-18 LAB — VITAMIN D 25 HYDROXY (VIT D DEFICIENCY, FRACTURES): VITD: 31.95 ng/mL (ref 30.00–100.00)

## 2024-04-18 LAB — LIPID PANEL
Cholesterol: 180 mg/dL (ref 0–200)
HDL: 35.5 mg/dL — ABNORMAL LOW (ref >39.00)
LDL Cholesterol: 115 mg/dL — ABNORMAL HIGH (ref 0–99)
NonHDL: 144
Total CHOL/HDL Ratio: 5
Triglycerides: 143 mg/dL (ref 0.0–149.0)
VLDL: 28.6 mg/dL (ref 0.0–40.0)

## 2024-04-18 LAB — CBC WITH DIFFERENTIAL/PLATELET
Basophils Absolute: 0.1 K/uL (ref 0.0–0.1)
Basophils Relative: 0.8 % (ref 0.0–3.0)
Eosinophils Absolute: 0.3 K/uL (ref 0.0–0.7)
Eosinophils Relative: 3.9 % (ref 0.0–5.0)
HCT: 43.6 % (ref 39.0–52.0)
Hemoglobin: 14.8 g/dL (ref 13.0–17.0)
Lymphocytes Relative: 28.8 % (ref 12.0–46.0)
Lymphs Abs: 2.4 K/uL (ref 0.7–4.0)
MCHC: 34 g/dL (ref 30.0–36.0)
MCV: 87.7 fl (ref 78.0–100.0)
Monocytes Absolute: 0.9 K/uL (ref 0.1–1.0)
Monocytes Relative: 11.5 % (ref 3.0–12.0)
Neutro Abs: 4.5 K/uL (ref 1.4–7.7)
Neutrophils Relative %: 55 % (ref 43.0–77.0)
Platelets: 200 K/uL (ref 150.0–400.0)
RBC: 4.97 Mil/uL (ref 4.22–5.81)
RDW: 13.8 % (ref 11.5–15.5)
WBC: 8.2 K/uL (ref 4.0–10.5)

## 2024-04-18 LAB — VITAMIN B12: Vitamin B-12: 344 pg/mL (ref 211–911)

## 2024-04-18 LAB — MICROALBUMIN / CREATININE URINE RATIO
Creatinine,U: 145.6 mg/dL
Microalb Creat Ratio: 6.4 mg/g (ref 0.0–30.0)
Microalb, Ur: 0.9 mg/dL (ref 0.0–1.9)

## 2024-04-18 LAB — COMPREHENSIVE METABOLIC PANEL WITH GFR
ALT: 16 U/L (ref 0–53)
AST: 14 U/L (ref 0–37)
Albumin: 4 g/dL (ref 3.5–5.2)
Alkaline Phosphatase: 73 U/L (ref 39–117)
BUN: 21 mg/dL (ref 6–23)
CO2: 30 meq/L (ref 19–32)
Calcium: 8.9 mg/dL (ref 8.4–10.5)
Chloride: 105 meq/L (ref 96–112)
Creatinine, Ser: 1.34 mg/dL (ref 0.40–1.50)
GFR: 51.77 mL/min — ABNORMAL LOW (ref >60.00)
Glucose, Bld: 125 mg/dL — ABNORMAL HIGH (ref 70–99)
Potassium: 4.6 meq/L (ref 3.5–5.1)
Sodium: 140 meq/L (ref 135–145)
Total Bilirubin: 0.7 mg/dL (ref 0.2–1.2)
Total Protein: 6.4 g/dL (ref 6.0–8.3)

## 2024-04-18 LAB — PHOSPHORUS: Phosphorus: 2.9 mg/dL (ref 2.3–4.6)

## 2024-04-18 LAB — PSA: PSA: 0.01 ng/mL — ABNORMAL LOW (ref 0.10–4.00)

## 2024-04-18 LAB — HEMOGLOBIN A1C: Hgb A1c MFr Bld: 6.7 % — ABNORMAL HIGH (ref 4.6–6.5)

## 2024-04-18 MED ORDER — OMEPRAZOLE 40 MG PO CPDR: 40.0000 mg | DELAYED_RELEASE_CAPSULE | ORAL | 3 refills | Status: AC

## 2024-04-18 MED ORDER — ATORVASTATIN CALCIUM 20 MG PO TABS: 20.0000 mg | ORAL_TABLET | Freq: Every day | ORAL | 4 refills | Status: AC

## 2024-04-18 MED ORDER — TRIAMCINOLONE ACETONIDE 0.1 % EX CREA: 1.0000 | TOPICAL_CREAM | Freq: Two times a day (BID) | CUTANEOUS | 1 refills | Status: AC

## 2024-04-18 NOTE — Assessment & Plan Note (Signed)
 Chronic on atorvastatin  - update FLP. The 10-year ASCVD risk score (Arnett DK, et al., 2019) is: 57.3%   Values used to calculate the score:     Age: 78 years     Clincally relevant sex: Male     Is Non-Hispanic African American: No     Diabetic: Yes     Tobacco smoker: No     Systolic Blood Pressure: 138 mmHg     Is BP treated: Yes     HDL Cholesterol: 29.2 mg/dL     Total Cholesterol: 175 mg/dL

## 2024-04-18 NOTE — Assessment & Plan Note (Addendum)
 S/p seed implants 2013. No longer sees urology. Update PSA, latest zero.

## 2024-04-18 NOTE — Assessment & Plan Note (Signed)
 Chronic, stable period on every other day PPI - continue.

## 2024-04-18 NOTE — Assessment & Plan Note (Addendum)
 Update labs.

## 2024-04-18 NOTE — Assessment & Plan Note (Addendum)
 Not using CPAP - h/o trouble tolerating mask. Has not seen pulm recently.

## 2024-04-18 NOTE — Assessment & Plan Note (Signed)
 Update levels after completing weekly replacement - not currently on oral replacement.

## 2024-04-18 NOTE — Assessment & Plan Note (Signed)
 Update A1c ?

## 2024-04-18 NOTE — Assessment & Plan Note (Signed)
 Preventative protocols reviewed and updated unless pt declined. Discussed healthy diet and lifestyle.

## 2024-04-18 NOTE — Progress Notes (Signed)
 Ph: [phone] Fax: [phone]   Patient ID: Kevin Campbell, male    DOB: Mar 10, 1946, 78 y.o.   MRN: [id number]  This visit was conducted in person.  BP 138/72   Pulse 60   Temp 98 F (36.7 C) (Oral)   Ht 5' 6.25 (1.683 m)   Wt 231 lb 6 oz (105 kg)   SpO2 96%   BMI 37.06 kg/m   BP Readings from Last 3 Encounters:  04/18/24 138/72  04/03/24 128/86  03/01/24 (!) 166/94   CC: CPE Subjective:   HPI: Kevin Campbell is a 78 y.o. male presenting on 04/18/2024 for Annual Exam (MCR prt 2 [AWV- 01/13/24].)   Saw health advisor 01/2024 for medicare wellness visit. Note reviewed.   No results found.  Flowsheet Row Office Visit from 04/18/2024 in [facility] [place]  PHQ-2 Total Score 0       04/18/2024    9:23 AM 01/13/2024    2:23 PM 08/16/2023   11:35 AM 05/27/2023   12:07 PM 12/20/2022    8:35 AM  Fall Risk   Falls in the past year? 1 1 1 1  0  Number falls in past yr: 0 0 0 0 0  Injury with Fall? 1 1 0 1 0  Comment bruised ribs bruised ribs:seen in office     Risk for fall due to :  No Fall Risks   Impaired vision  Follow up  Education provided;Falls prevention discussed   Falls prevention discussed  Fall happened over a year ago.   Helps neighbors by mowing their lawns.   Notes home BP readings have been elevated 140-160s.   GERD - managed with QOD PPI. Requests refill. Notes ongoing dysphagia. 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   H/o seronegative RA - manages with OTC cool gel with benefit.    Not taking b12 oral, ran out of vit D3 50k units weekly  Preventative: Colonoscopy 2011 - 1 HP, diverticulosis ([facility]).  Cologard negative 06/2020, 06/2023.  H/o prostate cancer 2013 s/p radioactive seed implant (MacDiarmid) last seen 2018.  Lung cancer screening - not eligible  Flu shot yearly - missed this year  COVID vaccine - got Pfizer x1 Pneumovax 2013, prevnar-13 2019,  pneumovax 05/2019 Td 2008  RSV - discussed  Shingrix - discussed  Advanced directive - has completed. HCPOA would be daughter Tawni Pinal. Full code but would not want prolonged life support if terminal condition. Asked to bring us  a copy. Packet provided today  Seat belt use discussed.  Sunscreen use discussed. No changing moles on skin.  Ex smoker - quit 1992  Alcohol - none  Dentist - has not seen - has upper dentures and bottom implants 08/2022  Eye exam - sees yearly s/p cataract surgery (Beavis 2020)  Bowel - no constipation  Bladder - h/o incontinence s/p PTNS without benefit. Nocturia x3-4.   Caffeine: 2 cups coffee occasionally, soda throughout the day Lives with wife, no pets.  2 grown children Occupation: truck driver Edu: HS Activity: sedentary, some yardwork  Diet: some water  with yardwork, good fruits and vegetables     Relevant past medical, surgical, family and social history reviewed and updated as indicated. Interim medical history since our last visit reviewed. Allergies and medications reviewed and updated. Outpatient Medications Prior to Visit  Medication Sig Dispense Refill   amLODipine  (NORVASC ) 10 MG tablet Take 1 tablet (10 mg total) by mouth  daily. 90 tablet 3   aspirin  EC 81 MG tablet Take 81 mg by mouth daily.     Crisaborole  (EUCRISA ) 2 % OINT Apply to aa's hands QD. 100 g 1   cyanocobalamin  (VITAMIN B12) 1000 MCG tablet Take 1 tablet (1,000 mcg total) by mouth daily.     mupirocin  ointment (BACTROBAN ) 2 % Apply 1 Application topically daily. Qd to excision site 22 g 0   atorvastatin  (LIPITOR) 20 MG tablet Take 1 tablet (20 mg total) by mouth daily at 6 PM. 90 tablet 4   Cholecalciferol (VITAMIN D3) 1.25 MG (50000 UT) TABS Take 1 tablet by mouth once a week. 12 tablet 1   omeprazole  (PRILOSEC) 40 MG capsule Take 1 capsule (40 mg total) by mouth daily. Take daily for 2 weeks then as needed 30 capsule 1   triamcinolone  cream (KENALOG ) 0.1 % Apply 1  Application topically 2 (two) times daily. Apply to AA for no more than 10 days at a time. 45 g 1   No facility-administered medications prior to visit.     Per HPI unless specifically indicated in ROS section below Review of Systems  Constitutional:  Negative for activity change, appetite change, chills, fatigue, fever and unexpected weight change.  HENT:  Negative for hearing loss.   Eyes:  Negative for visual disturbance.  Respiratory:  Negative for cough, chest tightness, shortness of breath and wheezing.   Cardiovascular:  Negative for chest pain, palpitations and leg swelling.  Gastrointestinal:  Negative for abdominal distention, abdominal pain, blood in stool, constipation, diarrhea, nausea and vomiting.  Genitourinary:  Negative for difficulty urinating and hematuria.  Musculoskeletal:  Negative for arthralgias, myalgias and neck pain.  Skin:  Negative for rash.  Neurological:  Negative for dizziness, seizures, syncope and headaches.  Hematological:  Negative for adenopathy. Does not bruise/bleed easily.  Psychiatric/Behavioral:  Negative for dysphoric mood. The patient is not nervous/anxious.     Objective:  BP 138/72   Pulse 60   Temp 98 F (36.7 C) (Oral)   Ht 5' 6.25 (1.683 m)   Wt 231 lb 6 oz (105 kg)   SpO2 96%   BMI 37.06 kg/m   Wt Readings from Last 3 Encounters:  04/18/24 231 lb 6 oz (105 kg)  04/03/24 231 lb 4 oz (104.9 kg)  03/01/24 236 lb 6.4 oz (107.2 kg)      Physical Exam Vitals and nursing note reviewed.  Constitutional:      General: He is not in acute distress.    Appearance: Normal appearance. He is well-developed. He is not ill-appearing.  HENT:     Head: Normocephalic and atraumatic.     Right Ear: Hearing, tympanic membrane, ear canal and external ear normal.     Left Ear: Hearing, tympanic membrane, ear canal and external ear normal.     Mouth/Throat:     Mouth: Mucous membranes are moist.     Pharynx: Oropharynx is clear. No  oropharyngeal exudate or posterior oropharyngeal erythema.  Eyes:     General: No scleral icterus.    Extraocular Movements: Extraocular movements intact.     Conjunctiva/sclera: Conjunctivae normal.     Pupils: Pupils are equal, round, and reactive to light.  Neck:     Thyroid : No thyroid  mass or thyromegaly.     Vascular: No carotid bruit.  Cardiovascular:     Rate and Rhythm: Normal rate and regular rhythm.     Pulses: Normal pulses.  Radial pulses are 2+ on the right side and 2+ on the left side.     Heart sounds: Normal heart sounds. No murmur heard. Pulmonary:     Effort: Pulmonary effort is normal. No respiratory distress.     Breath sounds: Normal breath sounds. No wheezing, rhonchi or rales.  Abdominal:     General: Bowel sounds are normal. There is no distension.     Palpations: Abdomen is soft. There is no mass.     Tenderness: There is no abdominal tenderness. There is no guarding or rebound.     Hernia: No hernia is present.  Musculoskeletal:        General: Normal range of motion.     Cervical back: Normal range of motion and neck supple.     Right lower leg: No edema.     Left lower leg: No edema.  Lymphadenopathy:     Cervical: No cervical adenopathy.  Skin:    General: Skin is warm and dry.     Findings: No rash.  Neurological:     General: No focal deficit present.     Mental Status: He is alert and oriented to person, place, and time.  Psychiatric:        Mood and Affect: Mood normal.        Behavior: Behavior normal.        Thought Content: Thought content normal.        Judgment: Judgment normal.       Results for orders placed or performed in visit on 09/15/23  Renal function panel   Collection Time: 09/15/23  8:45 AM  Result Value Ref Range   Sodium 141 135 - 145 mEq/L   Potassium 4.3 3.5 - 5.1 mEq/L   Chloride 106 96 - 112 mEq/L   CO2 28 19 - 32 mEq/L   Albumin 4.0 3.5 - 5.2 g/dL   BUN 22 6 - 23 mg/dL   Creatinine, Ser 8.62 0.40 -  1.50 mg/dL   Glucose, Bld 887 (H) 70 - 99 mg/dL   Phosphorus 3.6 2.3 - 4.6 mg/dL   GFR 49.36 (L) >39.99 mL/min   Calcium  9.2 8.4 - 10.5 mg/dL  VITAMIN D  25 Hydroxy (Vit-D Deficiency, Fractures)   Collection Time: 09/15/23  8:45 AM  Result Value Ref Range   VITD 33.14 30.00 - 100.00 ng/mL  Vitamin B12   Collection Time: 09/15/23  8:45 AM  Result Value Ref Range   Vitamin B-12 292 211 - 911 pg/mL    Assessment & Plan:   Problem List Items Addressed This Visit     Healthcare maintenance - Primary (Chronic)   Preventative protocols reviewed and updated unless pt declined. Discussed healthy diet and lifestyle.       Advanced care planning/counseling discussion (Chronic)   Previously discussed. Packet provided again today.       Dyslipidemia associated with type 2 diabetes mellitus (HCC)   Chronic on atorvastatin  - update FLP. The 10-year ASCVD risk score (Arnett DK, et al., 2019) is: 57.3%   Values used to calculate the score:     Age: 52 years     Clincally relevant sex: Male     Is Non-Hispanic African American: No     Diabetic: Yes     Tobacco smoker: No     Systolic Blood Pressure: 138 mmHg     Is BP treated: Yes     HDL Cholesterol: 29.2 mg/dL     Total Cholesterol: 175 mg/dL  Relevant Medications   atorvastatin  (LIPITOR) 20 MG tablet   Other Relevant Orders   Lipid panel   Comprehensive metabolic panel with GFR   Severe obesity (BMI 35.0-39.9) with comorbidity (HCC)   Encourage healthy diet and lifestyle choices to affect sustainable weight loss. Obesity complicated by comorbidities of htn, dm, dyslipidemia and ckd       Essential hypertension   Chronic, stable period on amlodipine  10mg  daily - continue.       Relevant Medications   atorvastatin  (LIPITOR) 20 MG tablet   GERD   Chronic, stable period on every other day PPI - continue.       Relevant Medications   omeprazole  (PRILOSEC) 40 MG capsule   CKD (chronic kidney disease) stage 3, GFR 30-59  ml/min (HCC)   Update labs.       Relevant Orders   Comprehensive metabolic panel with GFR   Phosphorus   VITAMIN D  25 Hydroxy (Vit-D Deficiency, Fractures)   Microalbumin / creatinine urine ratio   Parathyroid  hormone, intact (no Ca)   CBC with Differential/Platelet   Prediabetes   Update A1c       Relevant Orders   Hemoglobin A1c   Primary prostate adenocarcinoma (HCC)   S/p seed implants 2013. No longer sees urology. Update PSA, latest zero.       Relevant Orders   PSA   OSA (obstructive sleep apnea)   Not using CPAP - h/o trouble tolerating mask. Has not seen pulm recently.       Vitamin B12 deficiency   Update levels  off replacement.       Relevant Orders   Vitamin B12   Vitamin D  deficiency   Update levels after completing weekly replacement - not currently on oral replacement.       Relevant Orders   VITAMIN D  25 Hydroxy (Vit-D Deficiency, Fractures)   Other Visit Diagnoses       Contact dermatitis due to plant       Relevant Medications   triamcinolone  cream (KENALOG ) 0.1 %        Meds ordered this encounter  Medications   omeprazole  (PRILOSEC) 40 MG capsule    Sig: Take 1 capsule (40 mg total) by mouth every other day.    Dispense:  45 capsule    Refill:  3   atorvastatin  (LIPITOR) 20 MG tablet    Sig: Take 1 tablet (20 mg total) by mouth daily at 6 PM.    Dispense:  90 tablet    Refill:  4   triamcinolone  cream (KENALOG ) 0.1 %    Sig: Apply 1 Application topically 2 (two) times daily. Apply to AA for no more than 10 days at a time.    Dispense:  45 g    Refill:  1    Orders Placed This Encounter  Procedures   PSA   Lipid panel   Comprehensive metabolic panel with GFR   Hemoglobin A1c   Phosphorus   VITAMIN D  25 Hydroxy (Vit-D Deficiency, Fractures)   Microalbumin / creatinine urine ratio   Parathyroid  hormone, intact (no Ca)   CBC with Differential/Platelet   Vitamin B12    Patient Instructions  Labs and urine today  If  interested, check with pharmacy about new 2 shot shingles series (shingrix).  Bring us  a copy of your advanced directive.  Schedule eye exam if you're due.  Good to see you today Return in 6 months for follow up visit   Follow up plan: Return  in about 6 months (around 10/19/2024) for follow up visit.  Anton Blas, MD

## 2024-04-18 NOTE — Assessment & Plan Note (Signed)
 Chronic, stable period on amlodipine 10mg  daily - continue

## 2024-04-18 NOTE — Patient Instructions (Addendum)
 Labs and urine today  If interested, check with pharmacy about new 2 shot shingles series (shingrix).  Bring us  a copy of your advanced directive.  Schedule eye exam if you're due.  Good to see you today Return in 6 months for follow up visit

## 2024-04-18 NOTE — Assessment & Plan Note (Addendum)
 Previously discussed. Packet provided again today.

## 2024-04-18 NOTE — Assessment & Plan Note (Signed)
Update levels off replacement. 

## 2024-04-18 NOTE — Assessment & Plan Note (Signed)
 Encourage healthy diet and lifestyle choices to affect sustainable weight loss. Obesity complicated by comorbidities of htn, dm, dyslipidemia and ckd

## 2024-04-19 LAB — PARATHYROID HORMONE, INTACT (NO CA): PTH: 19 pg/mL (ref 16–77)

## 2024-04-20 ENCOUNTER — Ambulatory Visit: Payer: Self-pay | Admitting: Family Medicine

## 2024-04-20 MED ORDER — VITAMIN B-12 1000 MCG PO TABS: 1000.0000 ug | ORAL_TABLET | ORAL | Status: AC

## 2024-05-16 ENCOUNTER — Other Ambulatory Visit: Payer: Self-pay | Admitting: Family Medicine

## 2024-05-16 DIAGNOSIS — I10 Essential (primary) hypertension: Secondary | ICD-10-CM

## 2024-05-16 NOTE — Telephone Encounter (Signed)
 Pt's chart shows rx sent 04/03/24, #90/3 refills to [address]  Spoke with pt relaying info above. Pt verbalizes understanding but states he was at [facility] last week and was told they do not have an rx available.   Spoke with [facility] asking about rx and statement from pt. Confirms pt came by on 03/01/24 to pick up refill however, it was too soon. Pharmacy ran claim today and it did go through. Says they will get refill ready for pt.   Spoke with pt relaying message from pharmacy. Pt expresses his thanks.   Unpinned request.

## 2024-05-16 NOTE — Telephone Encounter (Signed)
 Copied from CRM [id number]. Topic: Clinical - Medication Refill >> May 16, 2024  3:42 PM Gennette ORN wrote: Medication:  amLODipine  (NORVASC ) 10 MG tablet    Has the patient contacted their pharmacy? Yes (Agent: If no, request that the patient contact the pharmacy for the refill. If patient does not wish to contact the pharmacy document the reason why and proceed with request.) (Agent: If yes, when and what did the pharmacy advise?)  This is the patient's preferred pharmacy:  [facility] [address] [place] - [address] [place] [address] Phone: [phone] Fax: [phone]  Is this the correct pharmacy for this prescription? Yes If no, delete pharmacy and type the correct one.   Has the prescription been filled recently? Yes  Is the patient out of the medication? Yes  Has the patient been seen for an appointment in the last year OR does the patient have an upcoming appointment? Yes  Can we respond through MyChart? Yes  Agent: Please be advised that Rx refills may take up to 3 business days. We ask that you follow-up with your pharmacy.

## 2024-08-21 ENCOUNTER — Ambulatory Visit: Payer: Self-pay

## 2024-08-21 ENCOUNTER — Encounter: Payer: Self-pay | Admitting: Pharmacist

## 2024-08-21 NOTE — Telephone Encounter (Signed)
 Noted. Agree with nursing triage decision. Will evaluate.

## 2024-08-21 NOTE — Telephone Encounter (Signed)
 Noted. Appreciate Kevin Campbell seeing this pleasant patient.

## 2024-08-21 NOTE — Telephone Encounter (Signed)
 FYI Only or Action Required?: FYI only for provider: appointment scheduled on 08/22/24.  Patient was last seen in primary care on 04/18/2024 by Rilla Baller, MD.  Called Nurse Triage reporting Fall and Rib Injury.  Symptoms began a week ago.  Interventions attempted: OTC medications: Tylenol .  Symptoms are: gradually worsening.  Triage Disposition: See Physician Within 24 Hours  Patient/caregiver understands and will follow disposition?: Yes  Copied from CRM #[id number]. Topic: Clinical - Red Word Triage >> Aug 21, 2024 11:57 AM Charolett L wrote: [facility] that prompted transfer to Nurse Triage: Patient fell and hurt his rib having pain and not getting any better... Reason for Disposition  [1] MODERATE pain (e.g., interferes with normal activities) AND [2] high-risk adult (e.g., age > 60 years, osteoporosis, chronic steroid use)  Answer Assessment - Initial Assessment Questions Pt reports Last Tuesday 11/4 he was walking dog and lost footing on edge of sidewalk. Fell and hit left chest and left arm. Did not hit head, left arm ok. Reports taking baby aspirin . Denies SOB, brusing, cuts or bleeding. Reports 5-6/10 pain at rest and severe pain when coughing or sneezing. Denies coughing up blood. Scheduled appt with different provider at home office d/t no PCP availability within timeframe. Advised UC or ED for worsening symptoms.   1. MECHANISM: How did the injury happen?     Walking dog on the sidewalk, lost footing on edge of sidewalk and fell and hit left chest and left arm. Did not hit head. Reports taking baby aspirin .  2. ONSET: When did the injury happen? (.e.g., minutes, hours, days ago)     Last Tuesday   3. LOCATION: Where on the chest is the injury located?     Left ribcage  4. APPEARANCE: What does the injury look like?     No bruising  5. BLEEDING: Is there any bleeding now? If Yes, ask: How long has it been bleeding?     Denies  6. SEVERITY: Any  difficulty with breathing?     Denies  7. SIZE: For cuts, bruises, or swelling, ask: How large is it? (e.g., inches or centimeters)     Denies  8. PAIN: Is there pain? If Yes, ask: How bad is the pain? (e.g., Scale 0-10; none, mild, moderate, severe)     5-6/10 constant pain. Severe when coughing or sneezing.   9. TETANUS: For any breaks in the skin, ask: When was your last tetanus booster?     N/a  Protocols used: Chest Injury-A-AH

## 2024-08-21 NOTE — Progress Notes (Signed)
 Pharmacy Quality Measure Review  This patient is appearing on a report for being at risk of failing the adherence measure for cholesterol (statin) medications this calendar year.   Medication: atorvastatin  20 mg Last fill date: 05/16/24 for 90 day supply  Contacted pharmacy to facilitate refills. Rx will be ready to pick up today.

## 2024-08-22 ENCOUNTER — Ambulatory Visit (INDEPENDENT_AMBULATORY_CARE_PROVIDER_SITE_OTHER)
Admission: RE | Admit: 2024-08-22 | Discharge: 2024-08-22 | Disposition: A | Discharge status: Home / Self Care | Source: Ambulatory Visit | Attending: Primary Care | Admitting: Primary Care

## 2024-08-22 ENCOUNTER — Ambulatory Visit: Admitting: Primary Care

## 2024-08-22 ENCOUNTER — Ambulatory Visit: Payer: Self-pay | Admitting: Primary Care

## 2024-08-22 ENCOUNTER — Encounter: Payer: Self-pay | Admitting: Primary Care

## 2024-08-22 VITALS — BP 136/86 | HR 63 | Temp 97.9°F | Ht 66.25 in | Wt 236.0 lb

## 2024-08-22 DIAGNOSIS — R0789 Other chest pain: Secondary | ICD-10-CM

## 2024-08-22 DIAGNOSIS — R0781 Pleurodynia: Secondary | ICD-10-CM | POA: Diagnosis not present

## 2024-08-22 NOTE — Assessment & Plan Note (Addendum)
 Posttraumatic Exam today overall reassuring without crepitus or evidence of pneumothorax.  Will obtain chest and rib x-rays today. Offered Toradol  injection for which she kindly declines.  Caution provided against recurrent BC powder use. Okay to alternate ibuprofen and Tylenol .  We also discussed the importance of deep breathing exercises to prevent pneumonia/other respiratory diseases.  Await results.

## 2024-08-22 NOTE — Patient Instructions (Signed)
 Avoid taking too many BC powders as this can cause stomach bleeding.  You may alternate between ibuprofen and Tylenol  for pain.  Complete xray(s) prior to leaving today. I will notify you of your results once received.  Be sure to complete this deep breathing exercises as discussed today.  It was a pleasure meeting you!

## 2024-08-22 NOTE — Progress Notes (Signed)
 Subjective:    Patient ID: Kevin Campbell, male    DOB: 11-14-1945, 78 y.o.   MRN: [id number]  Kevin Campbell is a very pleasant 78 y.o. male patient of Dr. Rilla with a history of hypertension, carotid artery stenosis, type 2 diabetes, rheumatoid arthritis, CKD, prostate cancer, overactive bladder, fatigue who presents today to discuss rib pain.  His pain began suddenly after a mechanical fall 8 days ago. He was at home, missed a step from the curb while walking his dog. He fell onto his left side. Since then he's experienced left anterior rib pain to the upper ribs. This pain is worse when laughing, sneezing, or coughing.  Last night he felt something pop while changing positions.  He denies shortness of breath, right sided pain, hitting his head, bracing. He's taken Tylenol , Ibuprofen, and BC powder with some improvement.    Review of Systems  Constitutional:  Negative for chills and fever.  Respiratory:  Negative for cough and shortness of breath.   Musculoskeletal:  Positive for arthralgias.  Hematological:  Does not bruise/bleed easily.         Past Medical History:  Diagnosis Date   Allergy    Basal cell carcinoma 09/13/2022   right upper back, Excised 11/02/22   BCC (basal cell carcinoma) 11/09/2022   left posterior shoulder, [facility]   BPH (benign prostatic hypertrophy)    Carpal tunnel syndrome on both sides    Cataract 05/22/2018   bilateral eyes   Colon polyps 2011   [name]   GERD (gastroesophageal reflux disease)    watches diet   Hiatal hernia    Hypertension    Internal hemorrhoids    Nocturia    Obstructive sleep apnea    Overactive bladder    due to prostate seed implant radiation, failed myrbetriq, rapaflo, jalyn , now PTNS (McDiarmid)   Presbyesophagus    Prostate cancer (HCC) 08/24/2012   Adenocarcinoma,gleason=3+4=7,& 3+3=6,PSA=5.75,volume=23cc(5/12)cores positive; treated with XRT and radioactive seed implant, doing well ([facility])   Rheumatoid  arthritis (HCC)    of hands by Xray but RF, CCP, CRP negative (12/2013)   Seasonal allergies    Sleep apnea    pt did wear face mask with CPAP - but not tolerating mask now - not wearing C-PAP    Social History   Socioeconomic History   Marital status: Widowed    Spouse name: Not on file   Number of children: Not on file   Years of education: Not on file   Highest education level: Not on file  Occupational History   Not on file  Tobacco Use   Smoking status: Former    Types: Cigars    Quit date: 10/11/1990    Years since quitting: 33.8   Smokeless tobacco: Never   Tobacco comments:    usually  chewed on cigars  Vaping Use   Vaping status: Never Used  Substance and Sexual Activity   Alcohol use: No   Drug use: No   Sexual activity: Not Currently  Other Topics Concern   Not on file  Social History Narrative   Caffeine: 2 cups coffee occasionally, soda throughout the day   Lives with wife, no pets.  2 grown children   Occupation: truck driver   Edu: HS   Activity: sedentary   Diet: rare water , good fruits and vegetables   Social Drivers of Corporate Investment Banker Strain: Low Risk  (01/13/2024)   Overall Financial Resource Strain (CARDIA)  Difficulty of Paying Living Expenses: Not hard at all  Food Insecurity: No Food Insecurity (01/13/2024)   Hunger Vital Sign    Worried About Running Out of Food in the Last Year: Never true    Ran Out of Food in the Last Year: Never true  Transportation Needs: No Transportation Needs (01/13/2024)   PRAPARE - Administrator, Civil Service (Medical): No    Lack of Transportation (Non-Medical): No  Physical Activity: Inactive (01/13/2024)   Exercise Vital Sign    Days of Exercise per Week: 0 days    Minutes of Exercise per Session: 0 min  Stress: No Stress Concern Present (01/13/2024)   [employer] of Occupational Health - Occupational Stress Questionnaire    Feeling of Stress : Not at all  Social Connections:  Socially Isolated (01/13/2024)   Social Connection and Isolation Panel    Frequency of Communication with Friends and Family: Three times a week    Frequency of Social Gatherings with Friends and Family: Three times a week    Attends Religious Services: Never    Active Member of Clubs or Organizations: No    Attends Banker Meetings: Never    Marital Status: Widowed  Intimate Partner Violence: Not At Risk (01/13/2024)   Humiliation, Afraid, Rape, and Kick questionnaire    Fear of Current or Ex-Partner: No    Emotionally Abused: No    Physically Abused: No    Sexually Abused: No    Past Surgical History:  Procedure Laterality Date   CARDIOVASCULAR STRESS TEST  11-26-2010   Normal lexiscan nuclear study with inferior thinning but no ischemia   CLAVICLE SURGERY Left 1968   REMOVAL OF BONE SPUR FROM PREVIOUS FX YEARS AGO   COLONOSCOPY  2011   1 polyp sigmoid (hyperplastic), diverticulosis, int hemorrhoids [facility])   KNEE ARTHROSCOPY Left JAN 2013   KNEE ARTHROSCOPY Right 12/2017   Dr. [facility]   PROSTATE BIOPSY  08/24/12   Adenocarcinoma   RADIOACTIVE SEED IMPLANT N/A 02/16/2013   Procedure: RADIOACTIVE PROSTATE SEED IMPLANT;  Surgeon: Oneil JAYSON Rafter, MD   SHOULDER ARTHROSCOPY WITH BICEPS TENDON REPAIR Right APRIL 2013   TOTAL KNEE ARTHROPLASTY  09/26/2012   Procedure: TOTAL KNEE ARTHROPLASTY;  Surgeon: Lamar Collet, MD;  Location: [facility];  Service: Orthopedics;  Laterality: Left;    Family History  Problem Relation Age of Onset   Coronary artery disease Mother 38       3vCABG   Arthritis Mother    Rheumatologic disease Mother    CAD Mother    Cancer Father        lung, smoker   Cancer Sister        twin sister - breast   Stroke Neg Hx    Diabetes Neg Hx    Colon cancer Neg Hx    Rectal cancer Neg Hx    Stomach cancer Neg Hx     Allergies  Allergen Reactions   Benadryl [Diphenhydramine Hcl] Shortness Of Breath   Celebrex  [Celecoxib] Other (See Comments)    Extreme drowsiness   Codeine Other (See Comments)    Extreme fatigue; dizziness   Flomax  [Tamsulosin  Hcl] Other (See Comments)    can't sleep   Hydrocodone  Other (See Comments)    Drunk feeling    Current Outpatient Medications on File Prior to Visit  Medication Sig Dispense Refill   amLODipine  (NORVASC ) 10 MG tablet Take 1 tablet (10 mg total) by mouth daily.  90 tablet 3   aspirin  EC 81 MG tablet Take 81 mg by mouth daily.     atorvastatin  (LIPITOR) 20 MG tablet Take 1 tablet (20 mg total) by mouth daily at 6 PM. 90 tablet 4   Crisaborole  (EUCRISA ) 2 % OINT Apply to aa's hands QD. 100 g 1   cyanocobalamin  (VITAMIN B12) 1000 MCG tablet Take 1 tablet (1,000 mcg total) by mouth every Monday, Wednesday, and Friday.     mupirocin  ointment (BACTROBAN ) 2 % Apply 1 Application topically daily. Qd to excision site 22 g 0   omeprazole  (PRILOSEC) 40 MG capsule Take 1 capsule (40 mg total) by mouth every other day. 45 capsule 3   triamcinolone  cream (KENALOG ) 0.1 % Apply 1 Application topically 2 (two) times daily. Apply to AA for no more than 10 days at a time. 45 g 1   No current facility-administered medications on file prior to visit.    BP 136/86   Pulse 63   Temp 97.9 F (36.6 C) (Oral)   Ht 5' 6.25 (1.683 m)   Wt 236 lb (107 kg)   SpO2 95%   BMI 37.80 kg/m  Objective:   Physical Exam Chest:  Breasts:    Left: Tenderness present. No swelling or skin change.       Comments: Tenderness to left upper anterior ribs as marked.  Skin:    General: Skin is warm and dry.     Findings: No bruising or erythema.  Neurological:     Mental Status: He is alert.     Physical Exam        Assessment & Plan:  Rib pain on left side Assessment & Plan: Posttraumatic Exam today overall reassuring without crepitus or evidence of pneumothorax.  Will obtain chest and rib x-rays today. Offered Toradol  injection for which she kindly  declines.  Caution provided against recurrent BC powder use. Okay to alternate ibuprofen and Tylenol .  We also discussed the importance of deep breathing exercises to prevent pneumonia/other respiratory diseases.  Await results.  Orders: -     DG Ribs Unilateral W/Chest Left    Assessment and Plan Assessment & Plan         Comer MARLA Gaskins, NP   History of Present Illness

## 2024-10-22 ENCOUNTER — Ambulatory Visit: Admitting: Family Medicine

## 2025-01-15 ENCOUNTER — Ambulatory Visit

## 2025-03-21 ENCOUNTER — Ambulatory Visit: Admitting: Dermatology
